# Patient Record
Sex: Male | Born: 1975 | Race: White | Hispanic: No | Marital: Single | Smoking: Current every day smoker
Health system: Southern US, Community
[De-identification: ages and names within clinical notes are randomized; demographics above are authoritative.]

## PROBLEM LIST (undated history)

## (undated) ENCOUNTER — Emergency Department (HOSPITAL_COMMUNITY): Admission: EM | Payer: Medicare Other | Source: Home / Self Care

## (undated) DIAGNOSIS — R569 Unspecified convulsions: Secondary | ICD-10-CM

## (undated) DIAGNOSIS — F32A Depression, unspecified: Secondary | ICD-10-CM

## (undated) DIAGNOSIS — M1990 Unspecified osteoarthritis, unspecified site: Secondary | ICD-10-CM

## (undated) DIAGNOSIS — H900 Conductive hearing loss, bilateral: Secondary | ICD-10-CM

## (undated) DIAGNOSIS — T7840XA Allergy, unspecified, initial encounter: Secondary | ICD-10-CM

## (undated) DIAGNOSIS — I10 Essential (primary) hypertension: Secondary | ICD-10-CM

## (undated) DIAGNOSIS — F1910 Other psychoactive substance abuse, uncomplicated: Secondary | ICD-10-CM

## (undated) DIAGNOSIS — A809 Acute poliomyelitis, unspecified: Secondary | ICD-10-CM

## (undated) DIAGNOSIS — K219 Gastro-esophageal reflux disease without esophagitis: Secondary | ICD-10-CM

## (undated) DIAGNOSIS — R748 Abnormal levels of other serum enzymes: Secondary | ICD-10-CM

## (undated) DIAGNOSIS — F329 Major depressive disorder, single episode, unspecified: Secondary | ICD-10-CM

## (undated) HISTORY — DX: Gastro-esophageal reflux disease without esophagitis: K21.9

## (undated) HISTORY — PX: WRIST SURGERY: SHX841

## (undated) HISTORY — DX: Major depressive disorder, single episode, unspecified: F32.9

## (undated) HISTORY — DX: Unspecified osteoarthritis, unspecified site: M19.90

## (undated) HISTORY — DX: Abnormal levels of other serum enzymes: R74.8

## (undated) HISTORY — DX: Conductive hearing loss, bilateral: H90.0

## (undated) HISTORY — DX: Other psychoactive substance abuse, uncomplicated: F19.10

## (undated) HISTORY — DX: Allergy, unspecified, initial encounter: T78.40XA

## (undated) HISTORY — DX: Depression, unspecified: F32.A

---
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
## 1998-11-04 ENCOUNTER — Emergency Department (HOSPITAL_COMMUNITY): Admission: EM | Admit: 1998-11-04 | Discharge: 1998-11-04 | Payer: Self-pay | Admitting: Emergency Medicine

## 1998-11-05 ENCOUNTER — Emergency Department (HOSPITAL_COMMUNITY): Admission: EM | Admit: 1998-11-05 | Discharge: 1998-11-05 | Payer: Self-pay | Admitting: Emergency Medicine

## 1999-11-04 ENCOUNTER — Emergency Department (HOSPITAL_COMMUNITY): Admission: EM | Admit: 1999-11-04 | Discharge: 1999-11-04 | Payer: Self-pay | Admitting: Emergency Medicine

## 2000-05-11 ENCOUNTER — Encounter: Payer: Self-pay | Admitting: Family Medicine

## 2000-05-11 ENCOUNTER — Emergency Department (HOSPITAL_COMMUNITY): Admission: EM | Admit: 2000-05-11 | Discharge: 2000-05-11 | Payer: Self-pay | Admitting: Emergency Medicine

## 2001-04-05 ENCOUNTER — Emergency Department (HOSPITAL_COMMUNITY): Admission: EM | Admit: 2001-04-05 | Discharge: 2001-04-05 | Payer: Self-pay | Admitting: Emergency Medicine

## 2001-07-28 ENCOUNTER — Encounter: Payer: Self-pay | Admitting: *Deleted

## 2001-07-28 ENCOUNTER — Emergency Department (HOSPITAL_COMMUNITY): Admission: EM | Admit: 2001-07-28 | Discharge: 2001-07-28 | Payer: Self-pay | Admitting: *Deleted

## 2002-04-23 ENCOUNTER — Emergency Department (HOSPITAL_COMMUNITY): Admission: EM | Admit: 2002-04-23 | Discharge: 2002-04-23 | Payer: Self-pay | Admitting: Emergency Medicine

## 2003-03-29 ENCOUNTER — Emergency Department (HOSPITAL_COMMUNITY): Admission: EM | Admit: 2003-03-29 | Discharge: 2003-03-29 | Payer: Self-pay | Admitting: Emergency Medicine

## 2004-03-31 ENCOUNTER — Emergency Department (HOSPITAL_COMMUNITY): Admission: EM | Admit: 2004-03-31 | Discharge: 2004-03-31 | Payer: Self-pay | Admitting: Emergency Medicine

## 2004-09-24 ENCOUNTER — Emergency Department (HOSPITAL_COMMUNITY): Admission: EM | Admit: 2004-09-24 | Discharge: 2004-09-24 | Payer: Self-pay | Admitting: Emergency Medicine

## 2004-10-20 ENCOUNTER — Emergency Department (HOSPITAL_COMMUNITY): Admission: EM | Admit: 2004-10-20 | Discharge: 2004-10-20 | Payer: Self-pay | Admitting: *Deleted

## 2004-11-25 ENCOUNTER — Emergency Department (HOSPITAL_COMMUNITY): Admission: EM | Admit: 2004-11-25 | Discharge: 2004-11-25 | Payer: Self-pay | Admitting: Emergency Medicine

## 2005-09-21 ENCOUNTER — Emergency Department (HOSPITAL_COMMUNITY): Admission: EM | Admit: 2005-09-21 | Discharge: 2005-09-21 | Payer: Self-pay | Admitting: Emergency Medicine

## 2006-01-02 ENCOUNTER — Emergency Department (HOSPITAL_COMMUNITY): Admission: EM | Admit: 2006-01-02 | Discharge: 2006-01-03 | Payer: Self-pay | Admitting: Emergency Medicine

## 2006-01-16 ENCOUNTER — Emergency Department (HOSPITAL_COMMUNITY): Admission: EM | Admit: 2006-01-16 | Discharge: 2006-01-16 | Payer: Self-pay | Admitting: Emergency Medicine

## 2006-05-04 ENCOUNTER — Ambulatory Visit: Payer: Self-pay | Admitting: Internal Medicine

## 2006-05-07 ENCOUNTER — Ambulatory Visit (HOSPITAL_COMMUNITY): Admission: RE | Admit: 2006-05-07 | Discharge: 2006-05-07 | Payer: Self-pay | Admitting: Internal Medicine

## 2006-05-11 ENCOUNTER — Ambulatory Visit: Payer: Self-pay | Admitting: Internal Medicine

## 2006-05-24 ENCOUNTER — Ambulatory Visit: Payer: Self-pay | Admitting: *Deleted

## 2006-09-04 ENCOUNTER — Encounter: Admission: RE | Admit: 2006-09-04 | Discharge: 2006-12-03 | Payer: Self-pay | Admitting: Family Medicine

## 2006-11-04 ENCOUNTER — Emergency Department (HOSPITAL_COMMUNITY): Admission: EM | Admit: 2006-11-04 | Discharge: 2006-11-04 | Payer: Self-pay | Admitting: Emergency Medicine

## 2007-01-14 ENCOUNTER — Emergency Department (HOSPITAL_COMMUNITY): Admission: EM | Admit: 2007-01-14 | Discharge: 2007-01-14 | Payer: Self-pay | Admitting: Family Medicine

## 2007-02-20 ENCOUNTER — Encounter: Admission: RE | Admit: 2007-02-20 | Discharge: 2007-05-03 | Payer: Self-pay | Admitting: Anesthesiology

## 2007-03-13 ENCOUNTER — Encounter (INDEPENDENT_AMBULATORY_CARE_PROVIDER_SITE_OTHER): Payer: Self-pay | Admitting: *Deleted

## 2007-10-05 ENCOUNTER — Emergency Department (HOSPITAL_COMMUNITY): Admission: EM | Admit: 2007-10-05 | Discharge: 2007-10-05 | Payer: Self-pay | Admitting: Emergency Medicine

## 2007-11-06 ENCOUNTER — Emergency Department (HOSPITAL_COMMUNITY): Admission: EM | Admit: 2007-11-06 | Discharge: 2007-11-06 | Payer: Self-pay | Admitting: Emergency Medicine

## 2007-12-31 ENCOUNTER — Emergency Department (HOSPITAL_COMMUNITY): Admission: EM | Admit: 2007-12-31 | Discharge: 2007-12-31 | Payer: Self-pay | Admitting: Emergency Medicine

## 2008-03-29 ENCOUNTER — Inpatient Hospital Stay (HOSPITAL_COMMUNITY): Admission: AD | Admit: 2008-03-29 | Discharge: 2008-04-01 | Payer: Self-pay | Admitting: Neurology

## 2008-03-29 ENCOUNTER — Encounter: Payer: Self-pay | Admitting: Emergency Medicine

## 2008-03-29 ENCOUNTER — Ambulatory Visit: Payer: Self-pay | Admitting: Internal Medicine

## 2008-11-02 ENCOUNTER — Emergency Department (HOSPITAL_COMMUNITY): Admission: EM | Admit: 2008-11-02 | Discharge: 2008-11-02 | Payer: Self-pay | Admitting: Emergency Medicine

## 2008-11-07 ENCOUNTER — Emergency Department (HOSPITAL_COMMUNITY): Admission: EM | Admit: 2008-11-07 | Discharge: 2008-11-07 | Payer: Self-pay | Admitting: Emergency Medicine

## 2008-11-16 ENCOUNTER — Emergency Department (HOSPITAL_COMMUNITY): Admission: EM | Admit: 2008-11-16 | Discharge: 2008-11-16 | Payer: Self-pay | Admitting: Emergency Medicine

## 2009-02-10 ENCOUNTER — Emergency Department (HOSPITAL_COMMUNITY): Admission: EM | Admit: 2009-02-10 | Discharge: 2009-02-10 | Payer: Self-pay | Admitting: Emergency Medicine

## 2009-03-31 ENCOUNTER — Encounter (INDEPENDENT_AMBULATORY_CARE_PROVIDER_SITE_OTHER): Payer: Self-pay | Admitting: Orthopedic Surgery

## 2009-03-31 ENCOUNTER — Ambulatory Visit (HOSPITAL_COMMUNITY): Admission: RE | Admit: 2009-03-31 | Discharge: 2009-03-31 | Payer: Self-pay | Admitting: Orthopedic Surgery

## 2009-06-27 ENCOUNTER — Emergency Department (HOSPITAL_COMMUNITY): Admission: EM | Admit: 2009-06-27 | Discharge: 2009-06-27 | Payer: Self-pay | Admitting: Emergency Medicine

## 2010-09-29 LAB — CBC
HCT: 41.4 % (ref 39.0–52.0)
Hemoglobin: 14.3 g/dL (ref 13.0–17.0)
MCHC: 34.5 g/dL (ref 30.0–36.0)
MCV: 94 fL (ref 78.0–100.0)
Platelets: 154 K/uL (ref 150–400)
RBC: 4.41 MIL/uL (ref 4.22–5.81)
RDW: 12.9 % (ref 11.5–15.5)
WBC: 5.6 K/uL (ref 4.0–10.5)

## 2010-10-04 LAB — POCT I-STAT, CHEM 8
Chloride: 103 mEq/L (ref 96–112)
Creatinine, Ser: 1.1 mg/dL (ref 0.4–1.5)
Glucose, Bld: 103 mg/dL — ABNORMAL HIGH (ref 70–99)
Potassium: 3.9 mEq/L (ref 3.5–5.1)
Sodium: 134 mEq/L — ABNORMAL LOW (ref 135–145)

## 2010-10-13 ENCOUNTER — Emergency Department (HOSPITAL_COMMUNITY)
Admission: EM | Admit: 2010-10-13 | Discharge: 2010-10-13 | Disposition: A | Discharge status: Home / Self Care | Payer: Medicare Other | Attending: Emergency Medicine | Admitting: Emergency Medicine

## 2010-10-13 DIAGNOSIS — I10 Essential (primary) hypertension: Secondary | ICD-10-CM | POA: Insufficient documentation

## 2010-10-13 DIAGNOSIS — W268XXA Contact with other sharp object(s), not elsewhere classified, initial encounter: Secondary | ICD-10-CM | POA: Insufficient documentation

## 2010-10-13 DIAGNOSIS — R569 Unspecified convulsions: Secondary | ICD-10-CM | POA: Insufficient documentation

## 2010-10-13 DIAGNOSIS — M9380 Other specified osteochondropathies of unspecified site: Secondary | ICD-10-CM | POA: Insufficient documentation

## 2010-10-13 DIAGNOSIS — S61209A Unspecified open wound of unspecified finger without damage to nail, initial encounter: Secondary | ICD-10-CM | POA: Insufficient documentation

## 2010-10-13 DIAGNOSIS — Z8679 Personal history of other diseases of the circulatory system: Secondary | ICD-10-CM | POA: Insufficient documentation

## 2010-10-13 DIAGNOSIS — F17200 Nicotine dependence, unspecified, uncomplicated: Secondary | ICD-10-CM | POA: Insufficient documentation

## 2010-11-08 NOTE — Procedures (Signed)
EEG NUMBER:  [id number].   HISTORY:  This is a 36 year old patient with new onset of seizure-type  activities and history of alcohol abuse.  This is a portable EEG  recording.  No skull defects noted.   MEDICATIONS:  Ativan, Dilantin, Diprivan, and Unasyn.   EEG CLASSIFICATION:  Essentially normal awake.   DESCRIPTION:  The background rhythm of this recording consists of a  fairly well-modulated medium amplitude alpha rhythm of 11 Hz that is  reactive to eye opening and closure.  As the record progresses, the  patient appears to remain in awake state throughout the recording.  Photic stimulation was performed resulting in a very minimal background  photic drive response.  Hyperventilation was not performed.  At no time  during the recording, did there appear to be evidence of spikes, spike  wave discharges, or evidence of focal slowing.  At times, overlying beta  frequency activity seen.  EKG monitor shows no evidence of cardiac  rhythm abnormalities with a heart rate of 78.   IMPRESSION:  This is an essentially normal EEG recording in the waking  state.  Overlying beta frequency activity may be related to medication  such as Ativan.  No epileptiform discharges were seen at any time during  this recording.      Marlan Palau, M.D.  Electronically Signed     ZOX:WRUE  D:  03/30/2008 16:14:02  T:  03/31/2008 04:22:45  Job #:  [id number]

## 2010-11-08 NOTE — H&P (Signed)
[name], Parker NO.:  [id number]   MEDICAL RECORD NO.:  [id number]          PATIENT TYPE:  INP   LOCATION:  3106                         FACILITY:  [facility]   PHYSICIAN:  Noel Christmas, MD    DATE OF BIRTH:  January 13, 1976   DATE OF ADMISSION:  03/29/2008  DATE OF DISCHARGE:                              HISTORY & PHYSICAL   CHIEF COMPLAINT:  Recurrent generalized seizures.   A 35 year old man who presented to the Emergency Room at [facility] following a witnessed generalized seizure.  The patient  apparently fell face down and had witnessed tonic-clonic activity  followed by postictal confusion.  He had a second generalized seizure in  the emergency room that was witnessed by the ER physician.  He was  treated with Ativan and subsequently given 1 g of fosphenytoin IV.  The  patient became awake and responsive.  CT of his head showed no acute  intracranial abnormalities.  He was subsequently transferred to [facility] for further management.  There is no previous history of  seizures and except for an equivocal history of childhood seizures.  He  admits to heavy alcohol drinking, but stopped 3-4 weeks ago.  The  patient experienced a third generalized seizure after arriving in the  Intensive Care Unit at [facility].   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  He has no  known systemic disease and takes no medications on a routine basis.   FAMILY HISTORY:  Unavailable.   SOCIAL HISTORY:  Also unavailable other than he apparently lives with  his girlfriend and recently began to do so.  His employment status is  unknown.   PHYSICAL EXAMINATION:  VITAL SIGNS:  Temperature 98.4, pulse 112 per  minute, respirations 20 per minute, and blood pressure was 129/82.  GENERAL:  Appearance was that of a young man of slender build who was  postictal and  responsive only to noxious stimuli.  His general exam showed abrasions involving his upper lip and chin.  HEAD, EYES, EARS, NOSE, AND THROAT:  Otherwise unremarkable.  NECK:  Supple with no masses or tenderness.  CHEST:  Clear to auscultation.  CARDIAC:  Unremarkable except for tachycardia.  S1 and S2 were normal.  There were no abnormal heart sounds.  ABDOMEN:  Soft and flat.  Bowel sounds were normal.  There were no  masses.  EXTREMITIES:  Asymmetrical with atrophy of his right leg and foot as  well as apparent footdrop on the right.  Left lower extremity appeared  to be normal in size.  Muscle tone was spastic throughout.  GU:  The patient has normal male genitalia.  RECTAL:  Deferred.  His pupils are equal and reactive normally to light.  Extraocular  movements were full and conjugated with oculocephalic maneuvers.  There  was no facial weakness.  He moved upper extremities equally and appeared  to have symmetrical strength in his upper extremities.  Deep tendon  refluxes were 2+ and  symmetrical except for absent reflexes involving  right lower extremity.  Plantar responses were mute.  Carotid  auscultation was normal.   CLINICAL IMPRESSION:  Status epilepticus, grand mal.   PLAN:  1. Intubation for airway protection as well as for administration of      propofol for seizure management.  2. We will continue phenytoin for seizure management as well.  3. EEG in the a.m.  4. MRI of the brain without and with contrast in the a.m.  5. Alcohol withdrawal precautions despite his history of apparently      discontinuing alcohol 3-4 weeks ago.  6. Thiamine 100 mg IM daily.      Noel Christmas, MD  Electronically Signed     CS/MEDQ  D:  03/29/2008  T:  03/30/2008  Job:  [id number]

## 2010-11-11 NOTE — Discharge Summary (Signed)
Keith Parker, KOWAL NO.:  [id number]   MEDICAL RECORD NO.:  [id number]           PATIENT TYPE:   LOCATION:                                 FACILITY:   PHYSICIAN:  Levert Feinstein, MD          DATE OF BIRTH:  11-02-1975   DATE OF ADMISSION:  DATE OF DISCHARGE:                               DISCHARGE SUMMARY   DISCHARGE DIAGNOSES:  1. Status epilepticus.  2. Alcohol abuse.  3. Polio with right leg weakness.   DISCHARGE MEDICATIONS:  Keppra 500 mg b.i.d.   HOSPITAL COURSE:  The patient is a 35 year old male, was admitted on  March 29, 2008, following generalized seizure, initial seizure was in  March 29, 2008, described as general tonic-clonic seizure, followed by  post event confusion, had 3 seizures in 24hours, was giving Ativan  subsequently loaded with fosphenytoin, and later required intubation for  airway protection. He was found to have aspiration in trach tube.   He was extubated after 24 hours.  He initially was giving Dilantin,  later switched over to Keppra 500 b.i.d. tolerated very well. No  recurrent episode.   PROCEDURE:  MRI of the brain with and without contrast was normal.  There was no acute lesion.  EEG was normal.  Lab evaluation, CMP with  mildly elevated glucose of 103 and mildly low albumin of 2.8, was  otherwise normal, CBC was normal, and Dilantin level was 15.8 upon  April 01, 2008, but later was switched to Keppra.   During the hospital admission, because of worry about aspiration  pneumonia, he was giving Zosyn, later discharged with Augmentin.  The  etiology for his status epilepticus likely alcohol withdrawal.  Neurological examination upon discharge, he was alert, oriented to  history taking and casual conversation.  He was afebrile.  Cranial  nerves II-XII were normal.  He does have profound atrophy of right lower  extremity and profound right leg weakness 0-1/5 due to previous polio,  but bilateral upper extremity and left  lower extremity motor strength is  normal.  He is to follow up with [facility] in 1 to 2 months  upon discharge.      Levert Feinstein, MD  Electronically Signed     YY/MEDQ  D:  04/02/2008  T:  04/03/2008  Job:  [id number]

## 2011-03-18 ENCOUNTER — Emergency Department (HOSPITAL_COMMUNITY): Payer: Medicare Other

## 2011-03-18 ENCOUNTER — Emergency Department (HOSPITAL_COMMUNITY)
Admission: EM | Admit: 2011-03-18 | Discharge: 2011-03-18 | Disposition: A | Discharge status: Home / Self Care | Payer: Medicare Other | Attending: Emergency Medicine | Admitting: Emergency Medicine

## 2011-03-18 DIAGNOSIS — M25539 Pain in unspecified wrist: Secondary | ICD-10-CM | POA: Insufficient documentation

## 2011-03-18 DIAGNOSIS — S52123A Displaced fracture of head of unspecified radius, initial encounter for closed fracture: Secondary | ICD-10-CM | POA: Insufficient documentation

## 2011-03-18 DIAGNOSIS — W11XXXA Fall on and from ladder, initial encounter: Secondary | ICD-10-CM | POA: Insufficient documentation

## 2011-03-18 DIAGNOSIS — Y92009 Unspecified place in unspecified non-institutional (private) residence as the place of occurrence of the external cause: Secondary | ICD-10-CM | POA: Insufficient documentation

## 2011-03-18 DIAGNOSIS — F17200 Nicotine dependence, unspecified, uncomplicated: Secondary | ICD-10-CM | POA: Insufficient documentation

## 2011-03-18 DIAGNOSIS — M12539 Traumatic arthropathy, unspecified wrist: Secondary | ICD-10-CM | POA: Insufficient documentation

## 2011-03-18 DIAGNOSIS — M25579 Pain in unspecified ankle and joints of unspecified foot: Secondary | ICD-10-CM | POA: Insufficient documentation

## 2011-03-27 LAB — COMPREHENSIVE METABOLIC PANEL
ALT: 24
AST: 33
AST: 35
Albumin: 3.3 — ABNORMAL LOW
BUN: 6
BUN: 7
CO2: 24
CO2: 28
Calcium: 8.2 — ABNORMAL LOW
Calcium: 8.6
Calcium: 8.6
Creatinine, Ser: 0.87
Creatinine, Ser: 1.09
GFR calc Af Amer: >60
GFR calc Af Amer: >60
GFR calc non Af Amer: >60
GFR calc non Af Amer: >60
Glucose, Bld: 71
Potassium: 3.3 — ABNORMAL LOW
Sodium: 143
Total Protein: 5.5 — ABNORMAL LOW
Total Protein: 5.8 — ABNORMAL LOW
Total Protein: 5.9 — ABNORMAL LOW

## 2011-03-27 LAB — BLOOD GAS, ARTERIAL
Acid-Base Excess: 0.3
Acid-Base Excess: 1.8
Acid-Base Excess: 1.9
Acid-Base Excess: 2.1 — ABNORMAL HIGH
Bicarbonate: 26 — ABNORMAL HIGH
Drawn by: 277331
FIO2: 0.4
FIO2: 0.4
FIO2: 0.6
MECHVT: 450
MECHVT: 550
O2 Saturation: 92.9
O2 Saturation: 97.8
O2 Saturation: 98.8
PEEP: 5
Patient temperature: 98.6
Patient temperature: 98.6
RATE: 10
RATE: 12
TCO2: 25.8
TCO2: 27.2
pCO2 arterial: 28.8 — ABNORMAL LOW
pH, Arterial: 7.395

## 2011-03-27 LAB — CBC
HCT: 40.1
Hemoglobin: 13.7
MCHC: 33.9
MCHC: 34.2
MCHC: 34.4
MCV: 96.4
MCV: 97
Platelets: 156
RBC: 4.14 — ABNORMAL LOW
RDW: 11.9
RDW: 12.2

## 2011-03-27 LAB — CK TOTAL AND CKMB (NOT AT ARMC)
CK, MB: 4.9 — ABNORMAL HIGH
Relative Index: 2.2

## 2011-03-27 LAB — URINALYSIS, ROUTINE W REFLEX MICROSCOPIC
Bilirubin Urine: NEGATIVE
Glucose, UA: >1000 — AB
Ketones, ur: 15 — AB
Leukocytes, UA: NEGATIVE
Nitrite: NEGATIVE
Protein, ur: NEGATIVE
pH: 7

## 2011-03-27 LAB — CULTURE, BAL-QUANTITATIVE W GRAM STAIN
Culture: NO GROWTH
Gram Stain: NONE SEEN

## 2011-03-27 LAB — PHENYTOIN LEVEL, TOTAL: Phenytoin Lvl: 17.7

## 2011-03-27 LAB — B-NATRIURETIC PEPTIDE (CONVERTED LAB): Pro B Natriuretic peptide (BNP): 258 — ABNORMAL HIGH

## 2011-03-27 LAB — MAGNESIUM: Magnesium: 2.1

## 2011-03-28 ENCOUNTER — Ambulatory Visit
Admission: RE | Admit: 2011-03-28 | Discharge: 2011-03-28 | Disposition: A | Discharge status: Home / Self Care | Payer: Medicare Other | Source: Ambulatory Visit | Attending: Sports Medicine | Admitting: Sports Medicine

## 2011-03-28 ENCOUNTER — Other Ambulatory Visit: Payer: Self-pay | Admitting: Sports Medicine

## 2011-03-28 DIAGNOSIS — R52 Pain, unspecified: Secondary | ICD-10-CM

## 2011-03-28 LAB — COMPREHENSIVE METABOLIC PANEL
ALT: 29
AST: 36
Albumin: 4.1
Alkaline Phosphatase: 65
BUN: 5 — ABNORMAL LOW
CO2: 21
Calcium: 9.8
Chloride: 101
Creatinine, Ser: 0.95
GFR calc Af Amer: >60
GFR calc non Af Amer: >60
Glucose, Bld: 89
Potassium: 3.6
Sodium: 139
Total Bilirubin: 0.5
Total Protein: 7.3

## 2011-03-28 LAB — URINALYSIS, ROUTINE W REFLEX MICROSCOPIC
Bilirubin Urine: NEGATIVE
Glucose, UA: NEGATIVE
Ketones, ur: NEGATIVE
Leukocytes, UA: NEGATIVE
Nitrite: NEGATIVE
Protein, ur: NEGATIVE
Specific Gravity, Urine: 1.02
Urobilinogen, UA: 0.2
pH: 7

## 2011-03-28 LAB — SALICYLATE LEVEL: Salicylate Lvl: <4

## 2011-03-28 LAB — CBC
HCT: 49.5
Hemoglobin: 17
MCHC: 34.4
MCV: 97.8
Platelets: 202
RBC: 5.06
RDW: 12
WBC: 15.7 — ABNORMAL HIGH

## 2011-03-28 LAB — URINE MICROSCOPIC-ADD ON

## 2011-03-28 LAB — DIFFERENTIAL
Basophils Absolute: 0.1
Basophils Relative: 1
Eosinophils Relative: 1
Lymphocytes Relative: 18

## 2011-03-28 LAB — RAPID URINE DRUG SCREEN, HOSP PERFORMED
Amphetamines: NOT DETECTED
Tetrahydrocannabinol: NOT DETECTED

## 2011-03-28 LAB — PROTIME-INR: INR: 1

## 2011-03-28 LAB — APTT: aPTT: 28

## 2011-03-28 LAB — LIPASE, BLOOD: Lipase: 17

## 2011-03-28 LAB — MAGNESIUM: Magnesium: 2.4

## 2011-03-28 LAB — GLUCOSE, CAPILLARY: Glucose-Capillary: 95

## 2011-07-18 ENCOUNTER — Emergency Department (HOSPITAL_COMMUNITY)
Admission: EM | Admit: 2011-07-18 | Discharge: 2011-07-18 | Disposition: A | Discharge status: Home / Self Care | Payer: Medicare Other | Attending: Emergency Medicine | Admitting: Emergency Medicine

## 2011-07-18 ENCOUNTER — Encounter (HOSPITAL_COMMUNITY): Payer: Self-pay | Admitting: Emergency Medicine

## 2011-07-18 DIAGNOSIS — K0889 Other specified disorders of teeth and supporting structures: Secondary | ICD-10-CM

## 2011-07-18 DIAGNOSIS — R220 Localized swelling, mass and lump, head: Secondary | ICD-10-CM | POA: Insufficient documentation

## 2011-07-18 DIAGNOSIS — K1370 Unspecified lesions of oral mucosa: Secondary | ICD-10-CM | POA: Insufficient documentation

## 2011-07-18 DIAGNOSIS — K089 Disorder of teeth and supporting structures, unspecified: Secondary | ICD-10-CM | POA: Insufficient documentation

## 2011-07-18 MED ORDER — HYDROCODONE-ACETAMINOPHEN 5-325 MG PO TABS: 1.0000 | ORAL_TABLET | Freq: Four times a day (QID) | ORAL | Status: AC | PRN

## 2011-07-18 MED ORDER — PENICILLIN V POTASSIUM 500 MG PO TABS: 500.0000 mg | ORAL_TABLET | Freq: Four times a day (QID) | ORAL | Status: AC

## 2011-07-18 MED ORDER — OXYCODONE-ACETAMINOPHEN 5-325 MG PO TABS
1.0000 | ORAL_TABLET | Freq: Once | ORAL | Status: AC
  Administered 2011-07-18: 1 via ORAL
  Filled 2011-07-18: qty 1

## 2011-07-18 NOTE — ED Provider Notes (Signed)
History     CSN: [id number]  Arrival date & time 07/18/11  1628   First MD Initiated Contact with Patient 07/18/11 1925      Chief Complaint  Patient presents with  . Dental Pain    (Consider location/radiation/quality/duration/timing/severity/associated sxs/prior treatment) Patient is a 36 y.o. male presenting with tooth pain. The history is provided by the patient (Patient complains of a toothache on the left upper side.). No language interpreter was used.  Dental PainThe primary symptoms include mouth pain. Primary symptoms do not include dental injury, headaches or cough. The symptoms began 2 days ago. The symptoms are worsening. The symptoms are new. The symptoms occur constantly.  Mouth pain began 3 - 5 days ago. Mouth pain occurs constantly. Affected locations include: teeth.  Additional symptoms include: dental sensitivity to temperature and gum swelling. Additional symptoms do not include: fatigue. Medical issues include: smoking. Medical issues do not include: alcohol problem.    History reviewed. No pertinent past medical history.  History reviewed. No pertinent past surgical history.  History reviewed. No pertinent family history.  History  Substance Use Topics  . Smoking status: Not on file  . Smokeless tobacco: Not on file  . Alcohol Use: No      Review of Systems  Constitutional: Negative for fatigue.  HENT: Negative for congestion, sinus pressure and ear discharge.        Tooth pain  Eyes: Negative for discharge.  Respiratory: Negative for cough.   Cardiovascular: Negative for chest pain.  Gastrointestinal: Negative for abdominal pain and diarrhea.  Genitourinary: Negative for frequency and hematuria.  Musculoskeletal: Negative for back pain.  Skin: Negative for rash.  Neurological: Negative for seizures and headaches.  Hematological: Negative.   Psychiatric/Behavioral: Negative for hallucinations.    Allergies  Review of patient's allergies  indicates no known allergies.  Home Medications   Current Outpatient Rx  Name Route Sig Dispense Refill  . HYDROCODONE-ACETAMINOPHEN 5-325 MG PO TABS Oral Take 1 tablet by mouth every 6 (six) hours as needed for pain. 30 tablet 0  . PENICILLIN V POTASSIUM 500 MG PO TABS Oral Take 1 tablet (500 mg total) by mouth 4 (four) times daily. 40 tablet 0    BP 151/105  Pulse 66  Temp(Src) 97.3 F (36.3 C) (Oral)  Resp 18  SpO2 97%  Physical Exam  Constitutional: He is oriented to person, place, and time. He appears well-developed.  HENT:  Head: Normocephalic and atraumatic.       Tender left upper molar  Eyes: Conjunctivae and EOM are normal. No scleral icterus.  Neck: Neck supple. No thyromegaly present.  Cardiovascular: Normal rate and regular rhythm.  Exam reveals no gallop and no friction rub.   No murmur heard. Pulmonary/Chest: No stridor. He has no wheezes. He has no rales. He exhibits no tenderness.  Abdominal: He exhibits no distension. There is no tenderness. There is no rebound.  Musculoskeletal: Normal range of motion. He exhibits no edema.  Lymphadenopathy:    He has no cervical adenopathy.  Neurological: He is oriented to person, place, and time. Coordination normal.  Skin: No rash noted. No erythema.  Psychiatric: He has a normal mood and affect. His behavior is normal.    ED Course  Procedures (including critical care time)  Labs Reviewed - No data to display No results found.   1. Toothache       MDM          Benny Lennert, MD 07/18/11 [phone]

## 2011-07-18 NOTE — ED Notes (Signed)
Pt c/o left lower tooth pain after breaking tooth 1 day ago; pt sts unable to see dentist until first of month

## 2011-08-18 ENCOUNTER — Encounter (HOSPITAL_COMMUNITY): Payer: Self-pay

## 2011-08-18 ENCOUNTER — Emergency Department (HOSPITAL_COMMUNITY): Payer: Medicare Other

## 2011-08-18 ENCOUNTER — Emergency Department (HOSPITAL_COMMUNITY)
Admission: EM | Admit: 2011-08-18 | Discharge: 2011-08-18 | Disposition: A | Discharge status: Home Health | Payer: Medicare Other | Attending: Emergency Medicine | Admitting: Emergency Medicine

## 2011-08-18 DIAGNOSIS — S59909A Unspecified injury of unspecified elbow, initial encounter: Secondary | ICD-10-CM | POA: Insufficient documentation

## 2011-08-18 DIAGNOSIS — S6990XA Unspecified injury of unspecified wrist, hand and finger(s), initial encounter: Secondary | ICD-10-CM | POA: Insufficient documentation

## 2011-08-18 DIAGNOSIS — M25539 Pain in unspecified wrist: Secondary | ICD-10-CM | POA: Insufficient documentation

## 2011-08-18 DIAGNOSIS — Z79899 Other long term (current) drug therapy: Secondary | ICD-10-CM | POA: Insufficient documentation

## 2011-08-18 DIAGNOSIS — S63509A Unspecified sprain of unspecified wrist, initial encounter: Secondary | ICD-10-CM | POA: Insufficient documentation

## 2011-08-18 DIAGNOSIS — W010XXA Fall on same level from slipping, tripping and stumbling without subsequent striking against object, initial encounter: Secondary | ICD-10-CM | POA: Insufficient documentation

## 2011-08-18 DIAGNOSIS — M25439 Effusion, unspecified wrist: Secondary | ICD-10-CM | POA: Insufficient documentation

## 2011-08-18 HISTORY — DX: Acute poliomyelitis, unspecified: A80.9

## 2011-08-18 MED ORDER — OXYCODONE-ACETAMINOPHEN 5-325 MG PO TABS
1.0000 | ORAL_TABLET | Freq: Once | ORAL | Status: AC
  Administered 2011-08-18: 1 via ORAL
  Filled 2011-08-18: qty 1

## 2011-08-18 MED ORDER — OXYCODONE-ACETAMINOPHEN 7.5-325 MG PO TABS: 1.0000 | ORAL_TABLET | ORAL | Status: AC | PRN

## 2011-08-18 NOTE — Discharge Instructions (Signed)
Wrist Pain Wrist injuries are frequent in adults and children. A sprain is an injury to the ligaments that hold your bones together. A strain is an injury to muscle or muscle cord-like structures (tendons) from stretching or pulling. Generally, when wrists are moderately tender to touch following a fall or injury, a break in the bone (fracture) may be present. Most wrist sprains or strains are better in 3 to 5 days, but complete healing may take several weeks. HOME CARE INSTRUCTIONS   Put ice on the injured area.   Put ice in a plastic bag.   Place a towel between your skin and the bag.   Leave the ice on for 15 to 20 minutes, 3 to 4 times a day, for the first 2 days.   Keep your arm raised above the level of your heart whenever possible to reduce swelling and pain.   Rest the injured area for at least 48 hours or as directed by your caregiver.   If a splint or elastic bandage has been applied, use it for as long as directed by your caregiver or until seen by a caregiver for a follow-up exam.   Only take over-the-counter or prescription medicines for pain, discomfort, or fever as directed by your caregiver.   Keep all follow-up appointments. You may need to follow up with a specialist or have follow-up X-rays. Improvement in pain level is not a guarantee that you did not fracture a bone in your wrist. The only way to determine whether or not you have a broken bone is by X-ray.  SEEK IMMEDIATE MEDICAL CARE IF:   Your fingers are swollen, very red, white, or cold and blue.   Your fingers are numb or tingling.   You have increasing pain.   You have difficulty moving your fingers.  MAKE SURE YOU:   Understand these instructions.   Will watch your condition.   Will get help right away if you are not doing well or get worse.  Document Released: 03/22/2005 Document Revised: 02/22/2011 Document Reviewed: 08/03/2010 [employer] Patient Information 2012 [employer]. 

## 2011-08-18 NOTE — ED Notes (Signed)
Pt. Larey Seat this am / slipped and caught  Himself with rt. Wrist,  Having pain and swelling.   Pt. Has a hx of rt. Wrist surgery

## 2011-08-18 NOTE — Progress Notes (Signed)
Orthopedic Tech Progress Note Patient Details:  Keith Parker 1976-03-23 [id number]  Type of Splint: Other (comment) Splint Location: right hand Splint Interventions: Application    Gaye Pollack 08/18/2011, 1:04 PM

## 2011-08-18 NOTE — ED Provider Notes (Signed)
History     CSN: [id number]  Arrival date & time 08/18/11  1119   First MD Initiated Contact with Patient 08/18/11 1137      Chief Complaint  Patient presents with  . Extremity Pain    (Consider location/radiation/quality/duration/timing/severity/associated sxs/prior treatment) Patient is a 36 y.o. male presenting with extremity pain. The history is provided by the patient.  Extremity Pain This is a new problem. Pertinent negatives include no shortness of breath.   patient states that he slipped on some water from the dog bowl fell on his right wrist. Said previous fractures of the right wrist and has had Kienbock's disease, and has had a proximal row carpectomy. He's had pain in his right wrist since the fall. No other injury.  Past Medical History  Diagnosis Date  . Polio     Past Surgical History  Procedure Date  . Wrist surgery     right    History reviewed. No pertinent family history.  History  Substance Use Topics  . Smoking status: Not on file  . Smokeless tobacco: Not on file  . Alcohol Use: No      Review of Systems  HENT: Negative for neck stiffness.   Eyes: Negative for pain.  Respiratory: Negative for shortness of breath.   Musculoskeletal:       Right wrist injury  Skin: Negative for rash.  Neurological: Negative for numbness.    Allergies  Review of patient's allergies indicates no known allergies.  Home Medications   Current Outpatient Rx  Name Route Sig Dispense Refill  . ARIPIPRAZOLE 2 MG PO TABS Oral Take 2 mg by mouth daily.    . BC HEADACHE POWDER PO Oral Take 1 packet by mouth every 6 (six) hours as needed. pain    . FLUOXETINE HCL 20 MG PO CAPS Oral Take 20 mg by mouth daily.    Marland Kitchen GABAPENTIN 300 MG PO CAPS Oral Take 900 mg by mouth at bedtime.    . TRAZODONE HCL 100 MG PO TABS Oral Take 100 mg by mouth at bedtime.    . OXYCODONE-ACETAMINOPHEN 7.5-325 MG PO TABS Oral Take 1 tablet by mouth every 4 (four) hours as needed for pain.  15 tablet 0    BP 127/80  Pulse 94  Temp(Src) 98 F (36.7 C) (Oral)  Resp 20  Ht 5\' 2"  (1.575 m)  Wt 144 lb (65.318 kg)  BMI 26.34 kg/m2  SpO2 97%  Physical Exam  Constitutional: He is oriented to person, place, and time. He appears well-developed and well-nourished.  Musculoskeletal:       No tenderness over right shoulder upper arm elbow or proximal forearm. There is tenderness and some swelling of the right wrist. Sensation is intact distally. The tenderness is worse on the lateral aspect distally. Her postsurgical scars. Skin is intact  Neurological: He is alert and oriented to person, place, and time.  Skin: Skin is warm and dry.    ED Course  Procedures (including critical care time)  Labs Reviewed - No data to display Dg Wrist Complete Right  08/18/2011  *RADIOLOGY REPORT*  Clinical Data: History of fall today with pain in the distal radius.  RIGHT WRIST - COMPLETE 3+ VIEW  Comparison: CT of the right wrist 03/18/2011.  Radiographs of right wrist 03/18/2011.  Findings: There are healed fractures of the distal radius and ulnar styloid.  Postoperative changes of resection of the proximal carpal row are again noted, with multiple dystrophic ossifications in the surrounding  soft tissues.  No definite acute displaced fractures noted on today's examination.  IMPRESSION: 1.  No acute radiographic abnormality of the right wrist. 2.  Old healed fractures of the distal right radius and ulnar styloid, and postoperative changes related to resection of the proximal carpal row redemonstrated, as above.  Original Report Authenticated By: Florencia Reasons, M.D.     1. Wrist sprain       MDM  Right wrist pain after fall. Previous fractures of the wrist. X-ray does not show a clear acute fracture. He was splinted and followup with Dr. Mina Marble as needed.        Juliet Rude. Rubin Payor, MD 08/18/11 1253

## 2012-03-20 ENCOUNTER — Emergency Department (INDEPENDENT_AMBULATORY_CARE_PROVIDER_SITE_OTHER)
Admission: EM | Admit: 2012-03-20 | Discharge: 2012-03-20 | Disposition: A | Discharge status: Home / Self Care | Payer: Medicare Other | Source: Home / Self Care

## 2012-03-20 ENCOUNTER — Encounter (HOSPITAL_COMMUNITY): Payer: Self-pay

## 2012-03-20 DIAGNOSIS — K089 Disorder of teeth and supporting structures, unspecified: Secondary | ICD-10-CM

## 2012-03-20 DIAGNOSIS — S025XXA Fracture of tooth (traumatic), initial encounter for closed fracture: Secondary | ICD-10-CM

## 2012-03-20 DIAGNOSIS — K0889 Other specified disorders of teeth and supporting structures: Secondary | ICD-10-CM

## 2012-03-20 MED ORDER — HYDROCODONE-IBUPROFEN 7.5-200 MG PO TABS: 1.0000 | ORAL_TABLET | Freq: Four times a day (QID) | ORAL | Status: DC | PRN

## 2012-03-20 MED ORDER — AMOXICILLIN 500 MG PO CAPS: 500.0000 mg | ORAL_CAPSULE | Freq: Three times a day (TID) | ORAL | Status: DC

## 2012-03-20 NOTE — ED Notes (Signed)
Has broken tooth w resultant pain

## 2012-03-20 NOTE — ED Provider Notes (Signed)
Medical screening examination/treatment/procedure(s) were performed by non-physician practitioner and as supervising physician I was immediately available for consultation/collaboration.  Raynald Blend, MD 03/20/12 [phone]

## 2012-03-20 NOTE — ED Provider Notes (Signed)
History     CSN: [id number]  Arrival date & time 03/20/12  1306   None     No chief complaint on file.   (Consider location/radiation/quality/duration/timing/severity/associated sxs/prior treatment) HPI Comments: 36 year old male who states he fractured his left third molar several months ago.  doing well up until this morning when it 0430 hours he developed moderate to severe tooth pain.he states he does not have enough money to get to a dentist to have it repaired.   Past Medical History  Diagnosis Date  . Polio     Past Surgical History  Procedure Date  . Wrist surgery     right    No family history on file.  History  Substance Use Topics  . Smoking status: Not on file  . Smokeless tobacco: Not on file  . Alcohol Use: No      Review of Systems  Constitutional: Negative.   HENT: Negative for rhinorrhea, neck pain and postnasal drip.   Respiratory: Negative.   Gastrointestinal: Negative.   Musculoskeletal: Negative.     Allergies  Review of patient's allergies indicates no known allergies.  Home Medications   Current Outpatient Rx  Name Route Sig Dispense Refill  . AMOXICILLIN 500 MG PO CAPS Oral Take 1 capsule (500 mg total) by mouth 3 (three) times daily. 21 capsule 0  . ARIPIPRAZOLE 2 MG PO TABS Oral Take 2 mg by mouth daily.    . BC HEADACHE POWDER PO Oral Take 1 packet by mouth every 6 (six) hours as needed. pain    . FLUOXETINE HCL 20 MG PO CAPS Oral Take 20 mg by mouth daily.    Marland Kitchen GABAPENTIN 300 MG PO CAPS Oral Take 900 mg by mouth at bedtime.    Marland Kitchen HYDROCODONE-IBUPROFEN 7.5-200 MG PO TABS Oral Take 1 tablet by mouth every 6 (six) hours as needed for pain. 15 tablet 0  . TRAZODONE HCL 100 MG PO TABS Oral Take 100 mg by mouth at bedtime.      BP 133/83  Pulse 77  Temp 98.8 F (37.1 C) (Oral)  Resp 20  SpO2 98%  Physical Exam  Constitutional: He is oriented to person, place, and time. He appears well-developed. No distress.  HENT:  Head:  Normocephalic and atraumatic.       Generally poor dentition with multiple visible caries. A third lower molar is fractured anteriorly with pulp exposure.  Neck: Normal range of motion. Neck supple.  Lymphadenopathy:    He has no cervical adenopathy.  Neurological: He is alert and oriented to person, place, and time.  Skin: Skin is warm and dry.    ED Course  Procedures (including critical care time)  Labs Reviewed - No data to display No results found.   1. Tooth fracture   2. Odontalgia       MDM  Vicoprofen one every 6 hours when necessary pain #15 Amoxicillin 500 mg 3 times a day Recommended a drug store and get a dental repair kit Recommend seeing a dentist as soon as possible        Hayden Rasmussen, NP 03/20/12 1413

## 2012-06-30 ENCOUNTER — Encounter (HOSPITAL_COMMUNITY): Payer: Self-pay | Admitting: Emergency Medicine

## 2012-06-30 ENCOUNTER — Emergency Department (HOSPITAL_COMMUNITY)
Admission: EM | Admit: 2012-06-30 | Discharge: 2012-06-30 | Disposition: A | Discharge status: Home / Self Care | Payer: Medicare Other | Attending: Emergency Medicine | Admitting: Emergency Medicine

## 2012-06-30 DIAGNOSIS — R05 Cough: Secondary | ICD-10-CM | POA: Insufficient documentation

## 2012-06-30 DIAGNOSIS — A8030 Acute paralytic poliomyelitis, unspecified: Secondary | ICD-10-CM | POA: Insufficient documentation

## 2012-06-30 DIAGNOSIS — F17200 Nicotine dependence, unspecified, uncomplicated: Secondary | ICD-10-CM | POA: Insufficient documentation

## 2012-06-30 DIAGNOSIS — J40 Bronchitis, not specified as acute or chronic: Secondary | ICD-10-CM | POA: Insufficient documentation

## 2012-06-30 DIAGNOSIS — R062 Wheezing: Secondary | ICD-10-CM | POA: Insufficient documentation

## 2012-06-30 DIAGNOSIS — R509 Fever, unspecified: Secondary | ICD-10-CM | POA: Insufficient documentation

## 2012-06-30 DIAGNOSIS — Z791 Long term (current) use of non-steroidal anti-inflammatories (NSAID): Secondary | ICD-10-CM | POA: Insufficient documentation

## 2012-06-30 DIAGNOSIS — Z79899 Other long term (current) drug therapy: Secondary | ICD-10-CM | POA: Insufficient documentation

## 2012-06-30 DIAGNOSIS — I10 Essential (primary) hypertension: Secondary | ICD-10-CM | POA: Insufficient documentation

## 2012-06-30 DIAGNOSIS — R059 Cough, unspecified: Secondary | ICD-10-CM | POA: Insufficient documentation

## 2012-06-30 DIAGNOSIS — J029 Acute pharyngitis, unspecified: Secondary | ICD-10-CM | POA: Insufficient documentation

## 2012-06-30 DIAGNOSIS — Z720 Tobacco use: Secondary | ICD-10-CM

## 2012-06-30 DIAGNOSIS — J329 Chronic sinusitis, unspecified: Secondary | ICD-10-CM | POA: Insufficient documentation

## 2012-06-30 DIAGNOSIS — M2550 Pain in unspecified joint: Secondary | ICD-10-CM | POA: Insufficient documentation

## 2012-06-30 DIAGNOSIS — R51 Headache: Secondary | ICD-10-CM | POA: Insufficient documentation

## 2012-06-30 HISTORY — DX: Essential (primary) hypertension: I10

## 2012-06-30 MED ORDER — ALBUTEROL SULFATE HFA 108 (90 BASE) MCG/ACT IN AERS
2.0000 | INHALATION_SPRAY | RESPIRATORY_TRACT | Status: DC | PRN
  Administered 2012-06-30: 2 via RESPIRATORY_TRACT
  Filled 2012-06-30: qty 6.7

## 2012-06-30 MED ORDER — AZITHROMYCIN 250 MG PO TABS
500.0000 mg | ORAL_TABLET | Freq: Once | ORAL | Status: AC
  Administered 2012-06-30: 500 mg via ORAL
  Filled 2012-06-30: qty 2

## 2012-06-30 MED ORDER — AZITHROMYCIN 250 MG PO TABS: 250.0000 mg | ORAL_TABLET | Freq: Every day | ORAL | Status: AC

## 2012-06-30 NOTE — ED Provider Notes (Signed)
History     CSN: [id number]  Arrival date & time 06/30/12  1051   First MD Initiated Contact with Patient 06/30/12 1141      Chief Complaint  Patient presents with  . Nasal Congestion    congestion and sinus drainage x 3 days  . Fever    101.0 per pt 12 hrs ago    (Consider location/radiation/quality/duration/timing/severity/associated sxs/prior treatment) Patient is a 37 y.o. male presenting with fever. The history is provided by the patient.  Fever Primary symptoms of the febrile illness include fever, headaches, cough, wheezing and arthralgias. Primary symptoms do not include shortness of breath, abdominal pain, nausea, vomiting, myalgias or rash. The current episode started 6 to 7 days ago. This is a new problem. Primary symptoms comment: Productive cough, sinus pressure, nasal congestion in a 2-ppd smoker. He reports fever as well.     Past Medical History  Diagnosis Date  . Polio   . Hypertension     Past Surgical History  Procedure Date  . Wrist surgery     right  . Leg surgery     Family History  Problem Relation Age of Onset  . Cancer Mother     History  Substance Use Topics  . Smoking status: Current Every Day Smoker    Types: Cigarettes  . Smokeless tobacco: Not on file  . Alcohol Use: No      Review of Systems  Constitutional: Positive for fever. Negative for chills.  HENT: Positive for congestion, sore throat, rhinorrhea and sinus pressure. Negative for trouble swallowing and neck pain.   Respiratory: Positive for cough and wheezing. Negative for shortness of breath.   Cardiovascular: Negative.  Negative for chest pain.       Chest pain with cough.  Gastrointestinal: Negative.  Negative for nausea, vomiting and abdominal pain.  Musculoskeletal: Positive for arthralgias. Negative for myalgias.  Skin: Negative.  Negative for rash.  Neurological: Positive for headaches.  Psychiatric/Behavioral: Negative for confusion.    Allergies  Review of  patient's allergies indicates no known allergies.  Home Medications   Current Outpatient Rx  Name  Route  Sig  Dispense  Refill  . FLUOXETINE HCL 20 MG PO CAPS   Oral   Take 20 mg by mouth daily.         Marland Kitchen GABAPENTIN 300 MG PO CAPS   Oral   Take 900 mg by mouth at bedtime.         . IBUPROFEN 200 MG PO TABS   Oral   Take 200 mg by mouth every 6 (six) hours as needed. Pain         . PSEUDOEPHEDRINE HCL 30 MG PO TABS   Oral   Take 30 mg by mouth every 4 (four) hours as needed. Congestion         . TRAZODONE HCL 100 MG PO TABS   Oral   Take 100 mg by mouth at bedtime.           BP 138/80  Pulse 97  Temp 98.3 F (36.8 C) (Oral)  Resp 18  Ht 5\' 2"  (1.575 m)  Wt 145 lb (65.772 kg)  BMI 26.52 kg/m2  SpO2 99%  Physical Exam  Constitutional: He is oriented to person, place, and time. He appears well-developed and well-nourished.  HENT:  Head: Normocephalic.  Right Ear: External ear normal.  Left Ear: External ear normal.  Nose: Mucosal edema present. Right sinus exhibits frontal sinus tenderness. Left sinus exhibits frontal  sinus tenderness.  Mouth/Throat: Oropharynx is clear and moist.  Neck: Normal range of motion. Neck supple.  Cardiovascular: Normal rate and normal heart sounds.   No murmur heard. Pulmonary/Chest: Effort normal. He has no wheezes. He has rales.  Abdominal: Soft. Bowel sounds are normal. He exhibits no distension. There is no tenderness.  Musculoskeletal: Normal range of motion.  Lymphadenopathy:    He has no cervical adenopathy.  Neurological: He is alert and oriented to person, place, and time.  Skin: Skin is warm and dry. No pallor.    ED Course  Procedures (including critical care time)  Labs Reviewed - No data to display No results found.   No diagnosis found.  1. Bronchitis 2. Sinusitis 3. Tobacco abuse  MDM  Smoker with URI symptoms x 1 week. Exam findings c/w sinusitis, bronchitis. Stable vital signs - no hypoxia or  tachycardia/tachypnea.         Arnoldo Hooker, PA-C 06/30/12 1211

## 2012-06-30 NOTE — ED Notes (Addendum)
Productive cough x 3 days, yellow discharge. Cough causing chest wall and hip pain. Denies sore throat. Did not tx for elevated temp, tylenol last taken at 10pm last night. No audible wheezing

## 2012-06-30 NOTE — ED Provider Notes (Signed)
Medical screening examination/treatment/procedure(s) were performed by non-physician practitioner and as supervising physician I was immediately available for consultation/collaboration.   Gavin Pound. Kadar Chance, MD 06/30/12 1219

## 2013-01-07 ENCOUNTER — Other Ambulatory Visit: Payer: Self-pay | Admitting: Orthopedic Surgery

## 2013-01-07 ENCOUNTER — Other Ambulatory Visit: Payer: Self-pay | Admitting: *Deleted

## 2013-01-07 DIAGNOSIS — G14 Postpolio syndrome: Secondary | ICD-10-CM

## 2013-01-08 ENCOUNTER — Other Ambulatory Visit: Payer: Medicare Other

## 2013-01-09 ENCOUNTER — Ambulatory Visit
Admission: RE | Admit: 2013-01-09 | Discharge: 2013-01-09 | Disposition: A | Discharge status: Home / Self Care | Payer: Medicare Other | Source: Ambulatory Visit | Attending: Orthopedic Surgery | Admitting: Orthopedic Surgery

## 2013-01-09 DIAGNOSIS — G14 Postpolio syndrome: Secondary | ICD-10-CM

## 2013-01-31 DIAGNOSIS — F1011 Alcohol abuse, in remission: Secondary | ICD-10-CM | POA: Insufficient documentation

## 2013-01-31 DIAGNOSIS — M549 Dorsalgia, unspecified: Secondary | ICD-10-CM | POA: Insufficient documentation

## 2013-01-31 DIAGNOSIS — F32A Depression, unspecified: Secondary | ICD-10-CM | POA: Insufficient documentation

## 2013-01-31 DIAGNOSIS — M25559 Pain in unspecified hip: Secondary | ICD-10-CM | POA: Insufficient documentation

## 2013-01-31 DIAGNOSIS — G4700 Insomnia, unspecified: Secondary | ICD-10-CM | POA: Insufficient documentation

## 2013-05-05 DIAGNOSIS — B91 Sequelae of poliomyelitis: Secondary | ICD-10-CM | POA: Insufficient documentation

## 2013-06-09 DIAGNOSIS — F1210 Cannabis abuse, uncomplicated: Secondary | ICD-10-CM | POA: Insufficient documentation

## 2013-06-09 DIAGNOSIS — F609 Personality disorder, unspecified: Secondary | ICD-10-CM | POA: Insufficient documentation

## 2013-06-27 DIAGNOSIS — F09 Unspecified mental disorder due to known physiological condition: Secondary | ICD-10-CM | POA: Diagnosis not present

## 2013-07-22 DIAGNOSIS — K620 Anal polyp: Secondary | ICD-10-CM | POA: Diagnosis not present

## 2013-07-22 DIAGNOSIS — K621 Rectal polyp: Secondary | ICD-10-CM | POA: Diagnosis not present

## 2013-07-22 DIAGNOSIS — D126 Benign neoplasm of colon, unspecified: Secondary | ICD-10-CM | POA: Diagnosis not present

## 2013-07-22 DIAGNOSIS — D128 Benign neoplasm of rectum: Secondary | ICD-10-CM | POA: Diagnosis not present

## 2013-07-22 DIAGNOSIS — D129 Benign neoplasm of anus and anal canal: Secondary | ICD-10-CM | POA: Diagnosis not present

## 2013-07-22 DIAGNOSIS — Z1211 Encounter for screening for malignant neoplasm of colon: Secondary | ICD-10-CM | POA: Diagnosis not present

## 2013-07-22 DIAGNOSIS — R195 Other fecal abnormalities: Secondary | ICD-10-CM | POA: Diagnosis not present

## 2013-07-22 DIAGNOSIS — R634 Abnormal weight loss: Secondary | ICD-10-CM | POA: Diagnosis not present

## 2013-08-06 DIAGNOSIS — IMO0002 Reserved for concepts with insufficient information to code with codable children: Secondary | ICD-10-CM | POA: Diagnosis not present

## 2013-08-06 DIAGNOSIS — R634 Abnormal weight loss: Secondary | ICD-10-CM | POA: Insufficient documentation

## 2013-08-06 DIAGNOSIS — F17200 Nicotine dependence, unspecified, uncomplicated: Secondary | ICD-10-CM | POA: Diagnosis not present

## 2013-08-06 DIAGNOSIS — G8929 Other chronic pain: Secondary | ICD-10-CM | POA: Diagnosis not present

## 2013-08-06 DIAGNOSIS — R41840 Attention and concentration deficit: Secondary | ICD-10-CM | POA: Insufficient documentation

## 2013-11-03 DIAGNOSIS — G8929 Other chronic pain: Secondary | ICD-10-CM | POA: Diagnosis not present

## 2013-11-03 DIAGNOSIS — Z79899 Other long term (current) drug therapy: Secondary | ICD-10-CM | POA: Insufficient documentation

## 2013-11-03 DIAGNOSIS — F410 Panic disorder [episodic paroxysmal anxiety] without agoraphobia: Secondary | ICD-10-CM | POA: Diagnosis not present

## 2013-11-29 ENCOUNTER — Emergency Department (HOSPITAL_COMMUNITY)
Admission: EM | Admit: 2013-11-29 | Discharge: 2013-11-29 | Disposition: A | Discharge status: Home / Self Care | Payer: Medicare Other | Attending: Emergency Medicine | Admitting: Emergency Medicine

## 2013-11-29 ENCOUNTER — Encounter (HOSPITAL_COMMUNITY): Payer: Self-pay | Admitting: Emergency Medicine

## 2013-11-29 ENCOUNTER — Emergency Department (HOSPITAL_COMMUNITY): Payer: Medicare Other

## 2013-11-29 DIAGNOSIS — A809 Acute poliomyelitis, unspecified: Secondary | ICD-10-CM | POA: Diagnosis not present

## 2013-11-29 DIAGNOSIS — M79609 Pain in unspecified limb: Secondary | ICD-10-CM | POA: Diagnosis not present

## 2013-11-29 DIAGNOSIS — S9030XA Contusion of unspecified foot, initial encounter: Secondary | ICD-10-CM | POA: Diagnosis not present

## 2013-11-29 DIAGNOSIS — F17200 Nicotine dependence, unspecified, uncomplicated: Secondary | ICD-10-CM | POA: Insufficient documentation

## 2013-11-29 DIAGNOSIS — S8990XA Unspecified injury of unspecified lower leg, initial encounter: Secondary | ICD-10-CM | POA: Diagnosis not present

## 2013-11-29 DIAGNOSIS — S9031XA Contusion of right foot, initial encounter: Secondary | ICD-10-CM

## 2013-11-29 DIAGNOSIS — S99919A Unspecified injury of unspecified ankle, initial encounter: Secondary | ICD-10-CM | POA: Diagnosis not present

## 2013-11-29 DIAGNOSIS — Y929 Unspecified place or not applicable: Secondary | ICD-10-CM | POA: Insufficient documentation

## 2013-11-29 DIAGNOSIS — Z79899 Other long term (current) drug therapy: Secondary | ICD-10-CM | POA: Insufficient documentation

## 2013-11-29 DIAGNOSIS — I10 Essential (primary) hypertension: Secondary | ICD-10-CM | POA: Diagnosis not present

## 2013-11-29 DIAGNOSIS — S8000XA Contusion of unspecified knee, initial encounter: Secondary | ICD-10-CM | POA: Diagnosis not present

## 2013-11-29 DIAGNOSIS — Y939 Activity, unspecified: Secondary | ICD-10-CM | POA: Insufficient documentation

## 2013-11-29 DIAGNOSIS — IMO0002 Reserved for concepts with insufficient information to code with codable children: Secondary | ICD-10-CM | POA: Insufficient documentation

## 2013-11-29 MED ORDER — OXYCODONE-ACETAMINOPHEN 5-325 MG PO TABS
1.0000 | ORAL_TABLET | Freq: Once | ORAL | Status: AC
  Administered 2013-11-29: 1 via ORAL
  Filled 2013-11-29: qty 1

## 2013-11-29 MED ORDER — HYDROCODONE-ACETAMINOPHEN 5-325 MG PO TABS: 1.0000 | ORAL_TABLET | ORAL | Status: DC | PRN

## 2013-11-29 MED ORDER — IBUPROFEN 800 MG PO TABS: 800.0000 mg | ORAL_TABLET | Freq: Three times a day (TID) | ORAL | Status: DC | PRN
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
## 2013-11-29 NOTE — ED Provider Notes (Signed)
Medical screening examination/treatment/procedure(s) were performed by non-physician practitioner and as supervising physician I was immediately available for consultation/collaboration.    Mirna Mires, MD 11/29/13 [phone]
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
## 2013-11-29 NOTE — Discharge Instructions (Signed)
Read the information below.  Use the prescribed medication as directed.  Please discuss all new medications with your pharmacist.  Do not take additional tylenol while taking the prescribed pain medication to avoid overdose.  You may return to the Emergency Department at any time for worsening condition or any new symptoms that concern you.  If you develop uncontrolled pain, weakness or numbness of the extremity, severe discoloration of the skin, or you are unable to walk, return to the ER for a recheck.      Foot Contusion A foot contusion is a deep bruise to the foot. Contusions are the result of an injury that caused bleeding under the skin. The contusion may turn blue, purple, or yellow. Minor injuries will give you a painless contusion, but more severe contusions may stay painful and swollen for a few weeks. CAUSES  A foot contusion comes from a direct blow to that area, such as a heavy object falling on the foot. SYMPTOMS   Swelling of the foot.  Discoloration of the foot.  Tenderness or soreness of the foot. DIAGNOSIS  You will have a physical exam and will be asked about your history. You may need an X-ray of your foot to look for a broken bone (fracture).  TREATMENT  An elastic wrap may be recommended to support your foot. Resting, elevating, and applying cold compresses to your foot are often the best treatments for a foot contusion. Over-the-counter medicines may also be recommended for pain control. HOME CARE INSTRUCTIONS   Put ice on the injured area.  Put ice in a plastic bag.  Place a towel between your skin and the bag.  Leave the ice on for 15-20 minutes, 03-04 times a day.  Only take over-the-counter or prescription medicines for pain, discomfort, or fever as directed by your caregiver.  If told, use an elastic wrap as directed. This can help reduce swelling. You may remove the wrap for sleeping, showering, and bathing. If your toes become numb, cold, or blue, take the  wrap off and reapply it more loosely.  Elevate your foot with pillows to reduce swelling.  Try to avoid standing or walking while the foot is painful. Do not resume use until instructed by your caregiver. Then, begin use gradually. If pain develops, decrease use. Gradually increase activities that do not cause discomfort until you have normal use of your foot.  See your caregiver as directed. It is very important to keep all follow-up appointments in order to avoid any lasting problems with your foot, including long-term (chronic) pain. SEEK IMMEDIATE MEDICAL CARE IF:   You have increased redness, swelling, or pain in your foot.  Your swelling or pain is not relieved with medicines.  You have loss of feeling in your foot or are unable to move your toes.  Your foot turns cold or blue.  You have pain when you move your toes.  Your foot becomes warm to the touch.  Your contusion does not improve in 2 days. MAKE SURE YOU:   Understand these instructions.  Will watch your condition.  Will get help right away if you are not doing well or get worse. Document Released: 04/03/2006 Document Revised: 12/12/2011 Document Reviewed: 05/16/2011 [facility] Patient Information 2014 [place], [place].

## 2014-01-27 DIAGNOSIS — F1190 Opioid use, unspecified, uncomplicated: Secondary | ICD-10-CM | POA: Insufficient documentation

## 2014-01-30 DIAGNOSIS — K089 Disorder of teeth and supporting structures, unspecified: Secondary | ICD-10-CM | POA: Diagnosis not present

## 2014-02-27 ENCOUNTER — Emergency Department (HOSPITAL_COMMUNITY): Payer: No Typology Code available for payment source

## 2014-02-27 ENCOUNTER — Emergency Department (HOSPITAL_COMMUNITY)
Admission: EM | Admit: 2014-02-27 | Discharge: 2014-02-27 | Disposition: A | Discharge status: Home / Self Care | Payer: No Typology Code available for payment source | Attending: Emergency Medicine | Admitting: Emergency Medicine

## 2014-02-27 ENCOUNTER — Encounter (HOSPITAL_COMMUNITY): Payer: Self-pay | Admitting: Emergency Medicine

## 2014-02-27 DIAGNOSIS — IMO0002 Reserved for concepts with insufficient information to code with codable children: Secondary | ICD-10-CM | POA: Diagnosis not present

## 2014-02-27 DIAGNOSIS — S298XXA Other specified injuries of thorax, initial encounter: Secondary | ICD-10-CM | POA: Insufficient documentation

## 2014-02-27 DIAGNOSIS — S139XXA Sprain of joints and ligaments of unspecified parts of neck, initial encounter: Secondary | ICD-10-CM | POA: Insufficient documentation

## 2014-02-27 DIAGNOSIS — I10 Essential (primary) hypertension: Secondary | ICD-10-CM | POA: Diagnosis not present

## 2014-02-27 DIAGNOSIS — Z79899 Other long term (current) drug therapy: Secondary | ICD-10-CM | POA: Insufficient documentation

## 2014-02-27 DIAGNOSIS — Y9389 Activity, other specified: Secondary | ICD-10-CM | POA: Insufficient documentation

## 2014-02-27 DIAGNOSIS — Y92410 Unspecified street and highway as the place of occurrence of the external cause: Secondary | ICD-10-CM | POA: Diagnosis not present

## 2014-02-27 DIAGNOSIS — T148XXA Other injury of unspecified body region, initial encounter: Secondary | ICD-10-CM | POA: Diagnosis not present

## 2014-02-27 DIAGNOSIS — S0993XA Unspecified injury of face, initial encounter: Secondary | ICD-10-CM | POA: Insufficient documentation

## 2014-02-27 DIAGNOSIS — S199XXA Unspecified injury of neck, initial encounter: Secondary | ICD-10-CM

## 2014-02-27 DIAGNOSIS — S3981XA Other specified injuries of abdomen, initial encounter: Secondary | ICD-10-CM | POA: Diagnosis not present

## 2014-02-27 DIAGNOSIS — S161XXA Strain of muscle, fascia and tendon at neck level, initial encounter: Secondary | ICD-10-CM

## 2014-02-27 DIAGNOSIS — F17200 Nicotine dependence, unspecified, uncomplicated: Secondary | ICD-10-CM | POA: Diagnosis not present

## 2014-02-27 DIAGNOSIS — M542 Cervicalgia: Secondary | ICD-10-CM | POA: Diagnosis not present

## 2014-02-27 DIAGNOSIS — S0990XA Unspecified injury of head, initial encounter: Secondary | ICD-10-CM | POA: Diagnosis not present

## 2014-02-27 LAB — I-STAT CHEM 8, ED
BUN: 13 mg/dL (ref 6–23)
CREATININE: 0.8 mg/dL (ref 0.50–1.35)
Calcium, Ion: 1.21 mmol/L (ref 1.12–1.23)
Chloride: 104 mEq/L (ref 96–112)
GLUCOSE: 91 mg/dL (ref 70–99)
HCT: 44 % (ref 39.0–52.0)
Hemoglobin: 15 g/dL (ref 13.0–17.0)
Potassium: 4 mEq/L (ref 3.7–5.3)
SODIUM: 138 meq/L (ref 137–147)
TCO2: 27 mmol/L (ref 0–100)

## 2014-02-27 MED ORDER — CYCLOBENZAPRINE HCL 10 MG PO TABS: 10.0000 mg | ORAL_TABLET | Freq: Two times a day (BID) | ORAL | Status: DC | PRN

## 2014-02-27 MED ORDER — OXYCODONE-ACETAMINOPHEN 5-325 MG PO TABS
1.0000 | ORAL_TABLET | Freq: Once | ORAL | Status: AC
  Administered 2014-02-27: 1 via ORAL
  Filled 2014-02-27: qty 1

## 2014-02-27 MED ORDER — HYDROCODONE-ACETAMINOPHEN 5-325 MG PO TABS: 1.0000 | ORAL_TABLET | Freq: Four times a day (QID) | ORAL | Status: DC | PRN

## 2014-02-27 MED ORDER — IOHEXOL 300 MG/ML  SOLN
100.0000 mL | Freq: Once | INTRAMUSCULAR | Status: AC | PRN
  Administered 2014-02-27: 100 mL via INTRAVENOUS

## 2014-02-27 NOTE — ED Notes (Signed)
Patient transported to CT without distress 

## 2014-02-27 NOTE — ED Notes (Signed)
NAD at this time.  

## 2014-02-27 NOTE — ED Notes (Signed)
PA at bedside.
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
## 2014-02-27 NOTE — Discharge Instructions (Signed)
Please call your doctor for a followup appointment within 24-48 hours. When you talk to your doctor please let them know that you were seen in the emergency department and have them acquire all of your records so that they can discuss the findings with you and formulate a treatment plan to fully care for your new and ongoing problems. Please call and set up an appointment with your primary care provider Please call and set up an appointment with orthopedics Please take medications as prescribed - while on pain medications there is to be no drinking alcohol, driving, operating any heavy machinery. If extra please dispose in a proper manner. Please do not take any extra Tylenol with this medication for this can lead to Tylenol overdose and liver issues.  Please avoid any physical or strenuous activity Please apply heat and massage Please continue to monitor symptoms closely and if symptoms are to worsen or change (fever greater than 101, chills, sweating, nausea, vomiting, chest pain, shortness of breathe, difficulty breathing, weakness, numbness, tingling, worsening or changes to pain pattern, swelling, fall, injury, dizziness, sudden loss of vision, blurred vision, inability to swallow, neck pain, neck stiffness, inability to control urine or bowel movements) please report back to the Emergency Department immediately.    Cervical Strain and Sprain (Whiplash) with Rehab Cervical strain and sprain are injuries that commonly occur with "whiplash" injuries. Whiplash occurs when the neck is forcefully whipped backward or forward, such as during a motor vehicle accident or during contact sports. The muscles, ligaments, tendons, discs, and nerves of the neck are susceptible to injury when this occurs. RISK FACTORS Risk of having a whiplash injury increases if:  Osteoarthritis of the spine.  Situations that make head or neck accidents or trauma more likely.  High-risk sports (football, rugby, wrestling,  hockey, auto racing, gymnastics, diving, contact karate, or boxing).  Poor strength and flexibility of the neck.  Previous neck injury.  Poor tackling technique.  Improperly fitted or padded equipment. SYMPTOMS   Pain or stiffness in the front or back of neck or both.  Symptoms may present immediately or up to 24 hours after injury.  Dizziness, headache, nausea, and vomiting.  Muscle spasm with soreness and stiffness in the neck.  Tenderness and swelling at the injury site. PREVENTION  Learn and use proper technique (avoid tackling with the head, spearing, and head-butting; use proper falling techniques to avoid landing on the head).  Warm up and stretch properly before activity.  Maintain physical fitness:  Strength, flexibility, and endurance.  Cardiovascular fitness.  Wear properly fitted and padded protective equipment, such as padded soft collars, for participation in contact sports. PROGNOSIS  Recovery from cervical strain and sprain injuries is dependent on the extent of the injury. These injuries are usually curable in 1 week to 3 months with appropriate treatment.  RELATED COMPLICATIONS   Temporary numbness and weakness may occur if the nerve roots are damaged, and this may persist until the nerve has completely healed.  Chronic pain due to frequent recurrence of symptoms.  Prolonged healing, especially if activity is resumed too soon (before complete recovery). TREATMENT  Treatment initially involves the use of ice and medication to help reduce pain and inflammation. It is also important to perform strengthening and stretching exercises and modify activities that worsen symptoms so the injury does not get worse. These exercises may be performed at home or with a therapist. For patients who experience severe symptoms, a soft, padded collar may be recommended to be  worn around the neck.  Improving your posture may help reduce symptoms. Posture improvement includes  pulling your chin and abdomen in while sitting or standing. If you are sitting, sit in a firm chair with your buttocks against the back of the chair. While sleeping, try replacing your pillow with a small towel rolled to 2 inches in diameter, or use a cervical pillow or soft cervical collar. Poor sleeping positions delay healing.  For patients with nerve root damage, which causes numbness or weakness, the use of a cervical traction apparatus may be recommended. Surgery is rarely necessary for these injuries. However, cervical strain and sprains that are present at birth (congenital) may require surgery. MEDICATION   If pain medication is necessary, nonsteroidal anti-inflammatory medications, such as aspirin and ibuprofen, or other minor pain relievers, such as acetaminophen, are often recommended.  Do not take pain medication for 7 days before surgery.  Prescription pain relievers may be given if deemed necessary by your caregiver. Use only as directed and only as much as you need. HEAT AND COLD:   Cold treatment (icing) relieves pain and reduces inflammation. Cold treatment should be applied for 10 to 15 minutes every 2 to 3 hours for inflammation and pain and immediately after any activity that aggravates your symptoms. Use ice packs or an ice massage.  Heat treatment may be used prior to performing the stretching and strengthening activities prescribed by your caregiver, physical therapist, or athletic trainer. Use a heat pack or a warm soak. SEEK MEDICAL CARE IF:   Symptoms get worse or do not improve in 2 weeks despite treatment.  New, unexplained symptoms develop (drugs used in treatment may produce side effects). EXERCISES RANGE OF MOTION (ROM) AND STRETCHING EXERCISES - Cervical Strain and Sprain These exercises may help you when beginning to rehabilitate your injury. In order to successfully resolve your symptoms, you must improve your posture. These exercises are designed to help  reduce the forward-head and rounded-shoulder posture which contributes to this condition. Your symptoms may resolve with or without further involvement from your physician, physical therapist or athletic trainer. While completing these exercises, remember:   Restoring tissue flexibility helps normal motion to return to the joints. This allows healthier, less painful movement and activity.  An effective stretch should be held for at least 20 seconds, although you may need to begin with shorter hold times for comfort.  A stretch should never be painful. You should only feel a gentle lengthening or release in the stretched tissue. STRETCH- Axial Extensors  Lie on your back on the floor. You may bend your knees for comfort. Place a rolled-up hand towel or dish towel, about 2 inches in diameter, under the part of your head that makes contact with the floor.  Gently tuck your chin, as if trying to make a "double chin," until you feel a gentle stretch at the base of your head.  Hold __________ seconds. Repeat __________ times. Complete this exercise __________ times per day.  STRETCH - Axial Extension   Stand or sit on a firm surface. Assume a good posture: chest up, shoulders drawn back, abdominal muscles slightly tense, knees unlocked (if standing) and feet hip width apart.  Slowly retract your chin so your head slides back and your chin slightly lowers. Continue to look straight ahead.  You should feel a gentle stretch in the back of your head. Be certain not to feel an aggressive stretch since this can cause headaches later.  Hold for __________ seconds.  Repeat __________ times. Complete this exercise __________ times per day. STRETCH - Cervical Side Bend   Stand or sit on a firm surface. Assume a good posture: chest up, shoulders drawn back, abdominal muscles slightly tense, knees unlocked (if standing) and feet hip width apart.  Without letting your nose or shoulders move, slowly tip your  right / left ear to your shoulder until your feel a gentle stretch in the muscles on the opposite side of your neck.  Hold __________ seconds. Repeat __________ times. Complete this exercise __________ times per day. STRETCH - Cervical Rotators   Stand or sit on a firm surface. Assume a good posture: chest up, shoulders drawn back, abdominal muscles slightly tense, knees unlocked (if standing) and feet hip width apart.  Keeping your eyes level with the ground, slowly turn your head until you feel a gentle stretch along the back and opposite side of your neck.  Hold __________ seconds. Repeat __________ times. Complete this exercise __________ times per day. RANGE OF MOTION - Neck Circles   Stand or sit on a firm surface. Assume a good posture: chest up, shoulders drawn back, abdominal muscles slightly tense, knees unlocked (if standing) and feet hip width apart.  Gently roll your head down and around from the back of one shoulder to the back of the other. The motion should never be forced or painful.  Repeat the motion 10-20 times, or until you feel the neck muscles relax and loosen. Repeat __________ times. Complete the exercise __________ times per day. STRENGTHENING EXERCISES - Cervical Strain and Sprain These exercises may help you when beginning to rehabilitate your injury. They may resolve your symptoms with or without further involvement from your physician, physical therapist, or athletic trainer. While completing these exercises, remember:   Muscles can gain both the endurance and the strength needed for everyday activities through controlled exercises.  Complete these exercises as instructed by your physician, physical therapist, or athletic trainer. Progress the resistance and repetitions only as guided.  You may experience muscle soreness or fatigue, but the pain or discomfort you are trying to eliminate should never worsen during these exercises. If this pain does worsen, stop  and make certain you are following the directions exactly. If the pain is still present after adjustments, discontinue the exercise until you can discuss the trouble with your clinician. STRENGTH - Cervical Flexors, Isometric  Face a wall, standing about 6 inches away. Place a small pillow, a ball about 6-8 inches in diameter, or a folded towel between your forehead and the wall.  Slightly tuck your chin and gently push your forehead into the soft object. Push only with mild to moderate intensity, building up tension gradually. Keep your jaw and forehead relaxed.  Hold 10 to 20 seconds. Keep your breathing relaxed.  Release the tension slowly. Relax your neck muscles completely before you start the next repetition. Repeat __________ times. Complete this exercise __________ times per day. STRENGTH- Cervical Lateral Flexors, Isometric   Stand about 6 inches away from a wall. Place a small pillow, a ball about 6-8 inches in diameter, or a folded towel between the side of your head and the wall.  Slightly tuck your chin and gently tilt your head into the soft object. Push only with mild to moderate intensity, building up tension gradually. Keep your jaw and forehead relaxed.  Hold 10 to 20 seconds. Keep your breathing relaxed.  Release the tension slowly. Relax your neck muscles completely before you start the next  repetition. Repeat __________ times. Complete this exercise __________ times per day. STRENGTH - Cervical Extensors, Isometric   Stand about 6 inches away from a wall. Place a small pillow, a ball about 6-8 inches in diameter, or a folded towel between the back of your head and the wall.  Slightly tuck your chin and gently tilt your head back into the soft object. Push only with mild to moderate intensity, building up tension gradually. Keep your jaw and forehead relaxed.  Hold 10 to 20 seconds. Keep your breathing relaxed.  Release the tension slowly. Relax your neck muscles  completely before you start the next repetition. Repeat __________ times. Complete this exercise __________ times per day. POSTURE AND BODY MECHANICS CONSIDERATIONS - Cervical Strain and Sprain Keeping correct posture when sitting, standing or completing your activities will reduce the stress put on different body tissues, allowing injured tissues a chance to heal and limiting painful experiences. The following are general guidelines for improved posture. Your physician or physical therapist will provide you with any instructions specific to your needs. While reading these guidelines, remember:  The exercises prescribed by your provider will help you have the flexibility and strength to maintain correct postures.  The correct posture provides the optimal environment for your joints to work. All of your joints have less wear and tear when properly supported by a spine with good posture. This means you will experience a healthier, less painful body.  Correct posture must be practiced with all of your activities, especially prolonged sitting and standing. Correct posture is as important when doing repetitive low-stress activities (typing) as it is when doing a single heavy-load activity (lifting). PROLONGED STANDING WHILE SLIGHTLY LEANING FORWARD When completing a task that requires you to lean forward while standing in one place for a long time, place either foot up on a stationary 2- to 4-inch high object to help maintain the best posture. When both feet are on the ground, the low back tends to lose its slight inward curve. If this curve flattens (or becomes too large), then the back and your other joints will experience too much stress, fatigue more quickly, and can cause pain.  RESTING POSITIONS Consider which positions are most painful for you when choosing a resting position. If you have pain with flexion-based activities (sitting, bending, stooping, squatting), choose a position that allows you to  rest in a less flexed posture. You would want to avoid curling into a fetal position on your side. If your pain worsens with extension-based activities (prolonged standing, working overhead), avoid resting in an extended position such as sleeping on your stomach. Most people will find more comfort when they rest with their spine in a more neutral position, neither too rounded nor too arched. Lying on a non-sagging bed on your side with a pillow between your knees, or on your back with a pillow under your knees will often provide some relief. Keep in mind, being in any one position for a prolonged period of time, no matter how correct your posture, can still lead to stiffness. WALKING Walk with an upright posture. Your ears, shoulders, and hips should all line up. OFFICE WORK When working at a desk, create an environment that supports good, upright posture. Without extra support, muscles fatigue and lead to excessive strain on joints and other tissues. CHAIR:  A chair should be able to slide under your desk when your back makes contact with the back of the chair. This allows you to work closely.  The chair's height should allow your eyes to be level with the upper part of your monitor and your hands to be slightly lower than your elbows.  Body position:  Your feet should make contact with the floor. If this is not possible, use a foot rest.  Keep your ears over your shoulders. This will reduce stress on your neck and low back. Document Released: 06/12/2005 Document Revised: 10/27/2013 Document Reviewed: 09/24/2008 [facility] Patient Information 2015 [place], [place]. This information is not intended to replace advice given to you by your health care provider. Make sure you discuss any questions you have with your health care provider.   Emergency Department Resource Guide 1) Find a Doctor and Pay Out of Pocket Although you won't have to find out who is covered by your insurance plan, it is a good  idea to ask around and get recommendations. You will then need to call the office and see if the doctor you have chosen will accept you as a new patient and what types of options they offer for patients who are self-pay. Some doctors offer discounts or will set up payment plans for their patients who do not have insurance, but you will need to ask so you aren't surprised when you get to your appointment.  2) Contact Your Local Health Department Not all health departments have doctors that can see patients for sick visits, but many do, so it is worth a call to see if yours does. If you don't know where your local health department is, you can check in your phone book. The CDC also has a tool to help you locate your state's health department, and many state websites also have listings of all of their local health departments.  3) Find a [facility] If your illness is not likely to be very severe or complicated, you may want to try a walk in clinic. These are popping up all over the country in pharmacies, drugstores, and shopping centers. They're usually staffed by nurse practitioners or physician assistants that have been trained to treat common illnesses and complaints. They're usually fairly quick and inexpensive. However, if you have serious medical issues or chronic medical problems, these are probably not your best option.  No Primary Care Doctor: - Call Health Connect at  [phone] - they can help you locate a primary care doctor that  accepts your insurance, provides certain services, etc. - Physician Referral Service- [phone]  Chronic Pain Problems: Organization         Address  Phone   Notes  [facility]  [phone] Patients need to be referred by their primary care doctor.   Medication Assistance: Organization         Address  Phone   Notes  [facility] Medication [facility] [address] [place], [place] [address] [phone]  --Must be a resident of [facility] -- Must have NO insurance coverage whatsoever (no Medicaid/ Medicare, etc.) -- The pt. MUST have a primary care doctor that directs their care regularly and follows them in the community   [facility]  [phone]   [employer]  [phone]    Agencies that provide inexpensive medical care: Organization         Address  Phone   Notes  [place]  [phone]   [facility]    [phone]   [facility] [place], [place] [address] [phone]   [place] [address], [place] [phone]   Planned Parenthood    [phone]   [facility]    [phone]   [facility] [address], [place] Phone:  [phone], Fax:  [phone] Hours of Operation:  9 am - 6 pm, M-F.  Also accepts Medicaid/Medicare and self-pay.  [facility] for [place], [address], [place] Phone: [phone], Fax: [phone]. Hours of Operation:  8:30 am - 5:30 pm, M-F.  Also accepts Medicaid and self-pay.  [facility] [place] [address] [place] Phone: [phone]   [place], [place], [place] [phone], Ext. [phone] Mondays & Thursdays: 7-9 AM.  First 15 patients are seen on a first come, first serve basis.    [place] Providers:  Organization         Address  Phone   Notes  [facility] [address], [place] [phone] Also accepts self-pay patients.  [facility] [address], [place]  [phone]   [place], [address], [place] [phone]   [facility] [address] [place] [phone]   Lucianne Lei [address], [place]   [phone] Only  accepts [place] Access [place] patients after they have their name applied to their card.   Self-Pay (no insurance) in [facility]:  Organization         Address  Phone   Notes  Sickle Cell Patients, [facility] [place] [phone]   [facility] [place] [phone]   [facility] [place]  [place], [place], [place] [phone]   [facility]/Dr. Osei-Bonsu  [address], [place] or [address], [place] [phone] Phone number for both [place] and [place] locations is the same.  [facility] [address] [place] [phone]   [facility] [address] [place] or [address] [phone]   [facility] [address] [place] [phone], phone; [phone], fax Sees patients 1st and 3rd Saturday of every month.  Must not qualify for public or private insurance (i.e. Medicaid, Medicare, [place] Health Choice, Veterans' Benefits)  Household income should be no more than 200% of the poverty level The clinic cannot treat you if you are pregnant or think you are pregnant  Sexually transmitted diseases are not treated at the clinic.    Dental Care: Organization         Address  Phone  Notes  [facility] Department of [facility] [phone] Accepts children up to age 31 who are enrolled in [place] or [place]; pregnant women with a Medicaid card; and children who have applied for Medicaid or [place] Health Choice, but were declined, whose parents can pay a reduced fee at time of service.  [facility] Department of [facility]  [address], [place] [phone] Accepts children up to age 7 who are enrolled in [place] or [place]; pregnant  women with  a Medicaid card; and children who have applied for Medicaid or [place] Health Choice, but were declined, whose parents can pay a reduced fee at time of service.  [place] [facility] PRO[facility]  Standard [phone] Patients are seen by appointment only. Walk-ins are not accepted. [place] will see patients 3 years of age and older. Monday - Tuesday (8am-5pm) Most Wednesdays (8:30-5pm) $30 per visit, cash only  [facility] Adult Dental Access PROGRAM  [address], [facility] [phone] Patients are seen by appointment only. Walk-ins are not accepted. [place] will see patients 94 years of age and older. One Wednesday Evening (Monthly: Volunteer Based).  $30 per visit, cash only  [place]  [phone] for adults; Children under age 53, call Graduate Pediatric Dentistry at [phone]. Children aged 67-14, please call [phone] to request a pediatric application.  Dental services are provided in all areas of dental care including fillings, crowns and bridges, complete and partial dentures, implants, gum treatment, root canals, and extractions. Preventive care is also provided. Treatment is provided to both adults and children. Patients are selected via a lottery and there is often a waiting list.   [facility] [address] [place]  [phone] [email or web address]   [facility] [address] [place], [place] [phone], Ext. [phone] Second and Fourth Thursday of each month, opens at 6:30 AM; Clinic ends at 9 AM.  Patients are seen on a first-come first-served basis, and a limited number are seen during each clinic.   [facility]  [address] [place], [place] [phone]   Eligibility Requirements You must have lived in [place], [place], or [place] counties for at least the last three months.   You cannot be eligible for state or federal sponsored  [employer], including [employer], [place], or [employer].   You generally cannot be eligible for healthcare insurance through your employer.    How to apply: Eligibility screenings are held every Tuesday and Wednesday afternoon from 1:00 pm until 4:00 pm. You do not need an appointment for the interview!  [facility] [address] [place], [place]   [place]  [place] Department  [place]  [phone]    Behavioral Health Resources in the Community: Intensive Outpatient Programs Organization         Address  Phone  Notes  [facility] [place] [address], [place], [place] [phone]   [facility] Outpatient [address], [place], [place]   ADS: [facility] [address], [place], [place]   [place] [address],  [place], [place] or [phone]   Substance Abuse Resources Organization         Address  Phone  Notes  [facility]  [phone]   [place]  [phone]   [facility]   Chinita Pester  [phone]   Residential & Outpatient Substance Abuse Program  [phone]   Psychological Services Organization         Address  Phone  Notes  [facility]  [place]  [phone]   [place] [address] [place] or [phone]    Mobile Crisis Teams Organization  Address  Phone  Notes  [facility], Mobile Crisis Care Unit  [phone]   [facility]  [address] [place], [place]   [facility] [address] [place] [phone]    Self-Help/Support Groups Organization         Address  Phone              Notes  [place] [place] - variety of support groups  [place] Call for more information  Narcotics Anonymous (NA), [facility] [address], [employer] [place]  2 meetings at this location   Special educational needs teacher         Address  Phone  Notes  [facility] [place],    [place]  [phone]   [facility]  [address] [place] [address], [place], [place]   [place], [place] [phone] Admissions: 8am-3pm M-F  [facility] [address]    [place], [place] [phone]   [facility] [address] [place], [place], [place]   [facility] [address],  [place], [place]   [facility] - Intensive Outpatient [address], [place], [place]   [facility] ([place]  [place], [place] [phone] or [phone]   [facility] ([facility] [address] [place], [place] Accepts Medicaid  [facility] [address]  [place] [phone] Substance Abuse/Addiction Treatment   [facility] Organization         Address  Phone  Notes  [facility]  [phone]   Domenic Schwab, PhD [address] Arlis Porta [place], [place]   [phone] or [phone]   [place], [place] [phone]   [facility] [address] [place], [place] [phone] Insurance/Medicaid/sponsorship through [facility] and Families [address]                                    [place], [place] [phone] [place] [address], [place] [phone]    Dr. Adele Schilder  [phone]   [facility] 1) [address], [place] 2) [place] 3)  [place] [address], [place] [phone]  [phone]   [place] [phone] or [phone] (After Hours)
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
## 2014-02-27 NOTE — ED Notes (Signed)
2 inch abrasion located under right nipple on right hand side. NO bruising noted.

## 2014-02-27 NOTE — ED Notes (Signed)
V/s taken at 16:05 when pt arrived

## 2014-02-28 NOTE — ED Provider Notes (Signed)
Medical screening examination/treatment/procedure(s) were performed by non-physician practitioner and as supervising physician I was immediately available for consultation/collaboration.   EKG Interpretation None       Threasa Beards, MD 02/28/14 [phone]

## 2014-03-05 DIAGNOSIS — M25559 Pain in unspecified hip: Secondary | ICD-10-CM | POA: Diagnosis not present
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
## 2014-11-10 ENCOUNTER — Emergency Department (INDEPENDENT_AMBULATORY_CARE_PROVIDER_SITE_OTHER)
Admission: EM | Admit: 2014-11-10 | Discharge: 2014-11-10 | Disposition: A | Discharge status: Home / Self Care | Payer: Medicare Other | Source: Home / Self Care | Attending: Family Medicine | Admitting: Family Medicine

## 2014-11-10 ENCOUNTER — Encounter (HOSPITAL_COMMUNITY): Payer: Self-pay | Admitting: *Deleted

## 2014-11-10 DIAGNOSIS — H6121 Impacted cerumen, right ear: Secondary | ICD-10-CM

## 2014-11-10 NOTE — ED Provider Notes (Signed)
CSN: [id number]     Arrival date & time 11/10/14  1927 History   First MD Initiated Contact with Patient 11/10/14 2027     No chief complaint on file.  (Consider location/radiation/quality/duration/timing/severity/associated sxs/prior Treatment) Patient is a 39 y.o. male presenting with ear pain. The history is provided by the patient and the spouse.  Otalgia Location:  Right Quality:  Dull Severity:  Mild Onset quality:  Sudden Duration:  3 days Progression:  Unchanged Chronicity:  New Context: loud noise   Relieved by:  None tried Worsened by:  Nothing tried Ineffective treatments:  None tried Associated symptoms: hearing loss   Associated symptoms: no ear discharge and no fever     Past Medical History  Diagnosis Date  . Polio   . Hypertension    Past Surgical History  Procedure Laterality Date  . Wrist surgery      right  . Leg surgery     Family History  Problem Relation Age of Onset  . Cancer Mother    History  Substance Use Topics  . Smoking status: Current Every Day Smoker    Types: Cigarettes  . Smokeless tobacco: Not on file  . Alcohol Use: No    Review of Systems  Constitutional: Negative.  Negative for fever.  HENT: Positive for ear pain and hearing loss. Negative for ear discharge.     Allergies  Review of patient's allergies indicates no known allergies.  Home Medications   Prior to Admission medications   Medication Sig Start Date End Date Taking? Authorizing Provider  cyclobenzaprine (FLEXERIL) 10 MG tablet Take 1 tablet (10 mg total) by mouth 2 (two) times daily as needed for muscle spasms. 02/27/14   Marissa Sciacca, PA-C  FLUoxetine (PROZAC) 20 MG capsule Take 20 mg by mouth daily.    Historical Provider, MD  gabapentin (NEURONTIN) 300 MG capsule Take 900 mg by mouth at bedtime.    Historical Provider, MD  HYDROcodone-acetaminophen (NORCO/VICODIN) 5-325 MG per tablet Take 1 tablet by mouth every 4 (four) hours as needed for moderate pain  or severe pain. 11/29/13   Clayton Bibles, PA-C  HYDROcodone-acetaminophen (NORCO/VICODIN) 5-325 MG per tablet Take 1 tablet by mouth every 6 (six) hours as needed for moderate pain or severe pain. 02/27/14   Marissa Sciacca, PA-C  ibuprofen (ADVIL,MOTRIN) 200 MG tablet Take 200 mg by mouth every 6 (six) hours as needed. Pain    Historical Provider, MD  ibuprofen (ADVIL,MOTRIN) 800 MG tablet Take 1 tablet (800 mg total) by mouth every 8 (eight) hours as needed for mild pain or moderate pain. 11/29/13   Clayton Bibles, PA-C  pseudoephedrine (SUDAFED) 30 MG tablet Take 30 mg by mouth every 4 (four) hours as needed. Congestion    Historical Provider, MD  traZODone (DESYREL) 100 MG tablet Take 100 mg by mouth at bedtime.    Historical Provider, MD   BP 130/84 mmHg  Pulse 88  Temp(Src) 98.3 F (36.8 C) (Oral)  Resp 18  SpO2 99% Physical Exam  Constitutional: He is oriented to person, place, and time. He appears well-developed and well-nourished. No distress.  HENT:  Head: Normocephalic.  Right Ear: Decreased hearing is noted.  Left Ear: External ear normal.  Ears:  Mouth/Throat: Oropharynx is clear and moist.  Eyes: Conjunctivae are normal. Pupils are equal, round, and reactive to light.  Neck: Normal range of motion. Neck supple.  Lymphadenopathy:    He has no cervical adenopathy.  Neurological: He is alert and oriented to person,  place, and time.  Skin: Skin is warm and dry.  Nursing note and vitals reviewed.   ED Course  Procedures (including critical care time) Labs Review Labs Reviewed - No data to display  Imaging Review No results found.   MDM   1. Cerumen impaction, right    Sx improved after irrig, , tm wnl.    Billy Fischer, MD 11/10/14 2056

## 2014-11-10 NOTE — ED Notes (Signed)
Can't hear out of R ear onset 3-4 days ago. No pain.

## 2015-01-05 ENCOUNTER — Encounter (HOSPITAL_COMMUNITY): Payer: Self-pay | Admitting: Neurology

## 2015-01-05 ENCOUNTER — Emergency Department (HOSPITAL_COMMUNITY)
Admission: EM | Admit: 2015-01-05 | Discharge: 2015-01-05 | Disposition: A | Discharge status: Home / Self Care | Payer: Medicare Other | Attending: Emergency Medicine | Admitting: Emergency Medicine

## 2015-01-05 DIAGNOSIS — Z79899 Other long term (current) drug therapy: Secondary | ICD-10-CM | POA: Diagnosis not present

## 2015-01-05 DIAGNOSIS — Z8612 Personal history of poliomyelitis: Secondary | ICD-10-CM | POA: Insufficient documentation

## 2015-01-05 DIAGNOSIS — I10 Essential (primary) hypertension: Secondary | ICD-10-CM | POA: Diagnosis not present

## 2015-01-05 DIAGNOSIS — R42 Dizziness and giddiness: Secondary | ICD-10-CM | POA: Diagnosis not present

## 2015-01-05 DIAGNOSIS — R55 Syncope and collapse: Secondary | ICD-10-CM | POA: Diagnosis not present

## 2015-01-05 DIAGNOSIS — Z720 Tobacco use: Secondary | ICD-10-CM | POA: Diagnosis not present

## 2015-01-05 DIAGNOSIS — R1110 Vomiting, unspecified: Secondary | ICD-10-CM | POA: Insufficient documentation

## 2015-01-05 DIAGNOSIS — R569 Unspecified convulsions: Secondary | ICD-10-CM | POA: Diagnosis present

## 2015-01-05 LAB — BASIC METABOLIC PANEL
ANION GAP: 16 — AB (ref 5–15)
BUN: <5 mg/dL — ABNORMAL LOW (ref 6–20)
CO2: 24 mmol/L (ref 22–32)
Calcium: 9 mg/dL (ref 8.9–10.3)
Chloride: 97 mmol/L — ABNORMAL LOW (ref 101–111)
Creatinine, Ser: 0.78 mg/dL (ref 0.61–1.24)
GFR calc non Af Amer: >60 mL/min (ref >60)
GLUCOSE: 101 mg/dL — AB (ref 65–99)
Potassium: 3.3 mmol/L — ABNORMAL LOW (ref 3.5–5.1)
SODIUM: 137 mmol/L (ref 135–145)

## 2015-01-05 LAB — CBC
HCT: 44.9 % (ref 39.0–52.0)
Hemoglobin: 15.8 g/dL (ref 13.0–17.0)
MCH: 32.4 pg (ref 26.0–34.0)
MCHC: 35.2 g/dL (ref 30.0–36.0)
MCV: 92.2 fL (ref 78.0–100.0)
PLATELETS: 156 10*3/uL (ref 150–400)
RBC: 4.87 MIL/uL (ref 4.22–5.81)
RDW: 13.2 % (ref 11.5–15.5)
WBC: 8.7 10*3/uL (ref 4.0–10.5)

## 2015-01-05 LAB — CBG MONITORING, ED: GLUCOSE-CAPILLARY: 103 mg/dL — AB (ref 65–99)

## 2015-01-05 MED ORDER — ONDANSETRON 4 MG PO TBDP
8.0000 mg | ORAL_TABLET | Freq: Once | ORAL | Status: AC
  Administered 2015-01-05: 8 mg via ORAL
  Filled 2015-01-05: qty 2

## 2015-01-05 MED ORDER — LORAZEPAM 2 MG/ML IJ SOLN
1.0000 mg | Freq: Once | INTRAMUSCULAR | Status: AC
  Administered 2015-01-05: 1 mg via INTRAVENOUS
  Filled 2015-01-05: qty 1

## 2015-01-05 MED ORDER — ONDANSETRON 4 MG PO TBDP: ORAL_TABLET | ORAL | Status: DC

## 2015-01-05 MED ORDER — SODIUM CHLORIDE 0.9 % IV BOLUS (SEPSIS)
2000.0000 mL | Freq: Once | INTRAVENOUS | Status: AC
  Administered 2015-01-05: 2000 mL via INTRAVENOUS

## 2015-01-05 NOTE — Discharge Instructions (Signed)
Near-Syncope Near-syncope (commonly known as near fainting) is sudden weakness, dizziness, or feeling like you might pass out. During an episode of near-syncope, you may also develop pale skin, have tunnel vision, or feel sick to your stomach (nauseous). Near-syncope may occur when getting up after sitting or while standing for a long time. It is caused by a sudden decrease in blood flow to the brain. This decrease can result from various causes or triggers, most of which are not serious. However, because near-syncope can sometimes be a sign of something serious, a medical evaluation is required. The specific cause is often not determined. HOME CARE INSTRUCTIONS  Monitor your condition for any changes. The following actions may help to alleviate any discomfort you are experiencing:  Have someone stay with you until you feel stable.  Lie down right away and prop your feet up if you start feeling like you might faint. Breathe deeply and steadily. Wait until all the symptoms have passed. Most of these episodes last only a few minutes. You may feel tired for several hours.   Drink enough fluids to keep your urine clear or pale yellow.   If you are taking blood pressure or heart medicine, get up slowly when seated or lying down. Take several minutes to sit and then stand. This can reduce dizziness.  Follow up with your health care provider as directed. SEEK IMMEDIATE MEDICAL CARE IF:   You have a severe headache.   You have unusual pain in the chest, abdomen, or back.   You are bleeding from the mouth or rectum, or you have black or tarry stool.   You have an irregular or very fast heartbeat.   You have repeated fainting or have seizure-like jerking during an episode.   You faint when sitting or lying down.   You have confusion.   You have difficulty walking.   You have severe weakness.   You have vision problems.  MAKE SURE YOU:   Understand these instructions.  Will  watch your condition.  Will get help right away if you are not doing well or get worse. Document Released: 06/12/2005 Document Revised: 06/17/2013 Document Reviewed: 11/15/2012 ExitCare Patient Information 2015 [employer]. This information is not intended to replace advice given to you by your health care provider. Make sure you discuss any questions you have with your health care provider.  

## 2015-01-05 NOTE — ED Notes (Signed)
Pt reports had seizure 1 hr ago, witnessed by friend. Was standing against truck, then fell back into truck and had 2 min seizure with upper body right and left arms shaking. Reports seizures with stress.

## 2015-01-05 NOTE — ED Notes (Signed)
  CBG 103  
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
## 2015-01-14 ENCOUNTER — Encounter (HOSPITAL_COMMUNITY): Payer: Self-pay | Admitting: Emergency Medicine

## 2015-01-14 ENCOUNTER — Emergency Department (HOSPITAL_COMMUNITY)
Admission: EM | Admit: 2015-01-14 | Discharge: 2015-01-14 | Disposition: A | Discharge status: Home / Self Care | Payer: Medicare Other | Attending: Emergency Medicine | Admitting: Emergency Medicine

## 2015-01-14 DIAGNOSIS — Z720 Tobacco use: Secondary | ICD-10-CM | POA: Diagnosis not present

## 2015-01-14 DIAGNOSIS — R079 Chest pain, unspecified: Secondary | ICD-10-CM | POA: Diagnosis present

## 2015-01-14 DIAGNOSIS — Z8619 Personal history of other infectious and parasitic diseases: Secondary | ICD-10-CM | POA: Insufficient documentation

## 2015-01-14 DIAGNOSIS — Z79899 Other long term (current) drug therapy: Secondary | ICD-10-CM | POA: Diagnosis not present

## 2015-01-14 DIAGNOSIS — F10129 Alcohol abuse with intoxication, unspecified: Secondary | ICD-10-CM | POA: Diagnosis not present

## 2015-01-14 DIAGNOSIS — I10 Essential (primary) hypertension: Secondary | ICD-10-CM | POA: Diagnosis not present

## 2015-01-14 DIAGNOSIS — Z7982 Long term (current) use of aspirin: Secondary | ICD-10-CM | POA: Insufficient documentation

## 2015-01-14 DIAGNOSIS — F10929 Alcohol use, unspecified with intoxication, unspecified: Secondary | ICD-10-CM

## 2015-01-14 HISTORY — DX: Unspecified convulsions: R56.9

## 2015-01-14 LAB — CBC WITH DIFFERENTIAL/PLATELET
BASOS PCT: 0 % (ref 0–1)
Basophils Absolute: 0 10*3/uL (ref 0.0–0.1)
EOS ABS: 0 10*3/uL (ref 0.0–0.7)
Eosinophils Relative: 0 % (ref 0–5)
HCT: 42.7 % (ref 39.0–52.0)
HEMOGLOBIN: 14.7 g/dL (ref 13.0–17.0)
LYMPHS PCT: 29 % (ref 12–46)
Lymphs Abs: 2.1 10*3/uL (ref 0.7–4.0)
MCH: 32.5 pg (ref 26.0–34.0)
MCHC: 34.4 g/dL (ref 30.0–36.0)
MCV: 94.5 fL (ref 78.0–100.0)
Monocytes Absolute: 0.7 10*3/uL (ref 0.1–1.0)
Monocytes Relative: 10 % (ref 3–12)
Neutro Abs: 4.5 10*3/uL (ref 1.7–7.7)
Neutrophils Relative %: 61 % (ref 43–77)
PLATELETS: 165 10*3/uL (ref 150–400)
RBC: 4.52 MIL/uL (ref 4.22–5.81)
RDW: 13.3 % (ref 11.5–15.5)
WBC: 7.4 10*3/uL (ref 4.0–10.5)

## 2015-01-14 LAB — COMPREHENSIVE METABOLIC PANEL
ALT: 31 U/L (ref 17–63)
ANION GAP: 12 (ref 5–15)
AST: 43 U/L — ABNORMAL HIGH (ref 15–41)
Albumin: 3.8 g/dL (ref 3.5–5.0)
Alkaline Phosphatase: 93 U/L (ref 38–126)
BUN: 5 mg/dL — ABNORMAL LOW (ref 6–20)
CO2: 27 mmol/L (ref 22–32)
CREATININE: 0.84 mg/dL (ref 0.61–1.24)
Calcium: 8.7 mg/dL — ABNORMAL LOW (ref 8.9–10.3)
Chloride: 103 mmol/L (ref 101–111)
GFR calc non Af Amer: >60 mL/min (ref >60)
Glucose, Bld: 101 mg/dL — ABNORMAL HIGH (ref 65–99)
POTASSIUM: 3.3 mmol/L — AB (ref 3.5–5.1)
Sodium: 142 mmol/L (ref 135–145)
Total Bilirubin: 0.2 mg/dL — ABNORMAL LOW (ref 0.3–1.2)
Total Protein: 6.9 g/dL (ref 6.5–8.1)

## 2015-01-14 LAB — RAPID URINE DRUG SCREEN, HOSP PERFORMED
Amphetamines: NOT DETECTED
BARBITURATES: NOT DETECTED
Benzodiazepines: NOT DETECTED
Cocaine: NOT DETECTED
Opiates: NOT DETECTED
TETRAHYDROCANNABINOL: NOT DETECTED

## 2015-01-14 LAB — ETHANOL: Alcohol, Ethyl (B): 294 mg/dL — ABNORMAL HIGH (ref <5)

## 2015-01-14 MED ORDER — ADULT MULTIVITAMIN W/MINERALS CH
1.0000 | ORAL_TABLET | Freq: Every day | ORAL | Status: DC
  Administered 2015-01-14: 1 via ORAL
  Filled 2015-01-14: qty 1

## 2015-01-14 MED ORDER — LORAZEPAM 1 MG PO TABS: 1.0000 mg | ORAL_TABLET | Freq: Four times a day (QID) | ORAL | Status: DC | PRN

## 2015-01-14 MED ORDER — CHLORDIAZEPOXIDE HCL 25 MG PO CAPS: ORAL_CAPSULE | ORAL | Status: DC

## 2015-01-14 MED ORDER — LORAZEPAM 2 MG/ML IJ SOLN
0.0000 mg | Freq: Four times a day (QID) | INTRAMUSCULAR | Status: DC
  Administered 2015-01-14: 1 mg via INTRAVENOUS
  Filled 2015-01-14: qty 1

## 2015-01-14 MED ORDER — LORAZEPAM 2 MG/ML IJ SOLN
0.0000 mg | Freq: Two times a day (BID) | INTRAMUSCULAR | Status: DC
Start: 2015-01-16 — End: 2015-01-14

## 2015-01-14 MED ORDER — LORAZEPAM 2 MG/ML IJ SOLN: 1.0000 mg | Freq: Four times a day (QID) | INTRAMUSCULAR | Status: DC | PRN

## 2015-01-14 MED ORDER — FOLIC ACID 1 MG PO TABS
1.0000 mg | ORAL_TABLET | Freq: Every day | ORAL | Status: DC
  Administered 2015-01-14: 1 mg via ORAL
  Filled 2015-01-14: qty 1

## 2015-01-14 MED ORDER — ONDANSETRON 4 MG PO TBDP: ORAL_TABLET | ORAL | Status: DC

## 2015-01-14 MED ORDER — THIAMINE HCL 100 MG/ML IJ SOLN
100.0000 mg | Freq: Every day | INTRAMUSCULAR | Status: DC
  Administered 2015-01-14: 100 mg via INTRAVENOUS
  Filled 2015-01-14: qty 2

## 2015-01-14 NOTE — ED Notes (Signed)
Seizure pads placed @ 11:40

## 2015-01-14 NOTE — ED Notes (Addendum)
Pt brought in for having seizure last night.  Pt last use of ETOH was this morning.  Pt states that he has history of seizures.  Pt also has polio. Pt states he feeling nauseated and having right sided chest pain that started this am.

## 2015-01-14 NOTE — ED Provider Notes (Signed)
CSN: [id number]     Arrival date & time 01/14/15  1131 History   First MD Initiated Contact with Patient 01/14/15 1136     Chief Complaint  Patient presents with  . Seizures  . Withdrawal  . Chest Pain   HPI  Pt has history of alcohol abuse.  Pt has been drinking alcohol for years.  He tried to stop drinking about a week ago.  Since he stopped drinking he has been having shakiness and weakness.  He also has had seizures.  Pt had a seizure last night.  He started drinking alcohol again this morning.  He has been having nausea.  He also had some pain on the right side of his chest.   Past Medical History  Diagnosis Date  . Polio   . Hypertension   . Seizures    Past Surgical History  Procedure Laterality Date  . Wrist surgery      right  . Leg surgery  1988    8 surgeries-hips and legs for walking after getting polio- last one 1988   Family History  Problem Relation Age of Onset  . Cancer Mother    History  Substance Use Topics  . Smoking status: Current Every Day Smoker -- 1.00 packs/day    Types: Cigarettes  . Smokeless tobacco: Not on file  . Alcohol Use: 25.2 oz/week    42 Cans of beer per week    Review of Systems  Constitutional: Negative for fever.  Cardiovascular: Positive for chest pain.  All other systems reviewed and are negative.     Allergies  Review of patient's allergies indicates no known allergies.  Home Medications   Prior to Admission medications   Medication Sig Start Date End Date Taking? Authorizing Provider  aspirin 325 MG tablet Take 325 mg by mouth daily.   Yes Historical Provider, MD  cetirizine (ZYRTEC) 10 MG chewable tablet Chew 10 mg by mouth daily.   Yes Historical Provider, MD  chlordiazePOXIDE (LIBRIUM) 25 MG capsule 50mg  PO TID x 1D, then 25-50mg  PO BID X 1D, then 25-50mg  PO QD X 1D 01/14/15   Dorie Rank, MD  cyclobenzaprine (FLEXERIL) 10 MG tablet Take 1 tablet (10 mg total) by mouth 2 (two) times daily as needed for muscle  spasms. Patient not taking: Reported on 01/05/2015 02/27/14   Marissa Sciacca, PA-C  HYDROcodone-acetaminophen (NORCO/VICODIN) 5-325 MG per tablet Take 1 tablet by mouth every 4 (four) hours as needed for moderate pain or severe pain. Patient not taking: Reported on 01/05/2015 11/29/13   Clayton Bibles, PA-C  HYDROcodone-acetaminophen (NORCO/VICODIN) 5-325 MG per tablet Take 1 tablet by mouth every 6 (six) hours as needed for moderate pain or severe pain. Patient not taking: Reported on 01/05/2015 02/27/14   Marissa Sciacca, PA-C  ibuprofen (ADVIL,MOTRIN) 800 MG tablet Take 1 tablet (800 mg total) by mouth every 8 (eight) hours as needed for mild pain or moderate pain. Patient not taking: Reported on 01/05/2015 11/29/13   Clayton Bibles, PA-C  ondansetron (ZOFRAN ODT) 4 MG disintegrating tablet 4mg  ODT q4 hours prn nausea/vomit 01/14/15   Dorie Rank, MD   BP 112/67 mmHg  Pulse 95  Temp(Src) 98.2 F (36.8 C) (Oral)  Resp 19  SpO2 99% Physical Exam  Constitutional: He appears well-developed and well-nourished. No distress.  HENT:  Head: Normocephalic and atraumatic.  Right Ear: External ear normal.  Left Ear: External ear normal.  Eyes: Conjunctivae are normal. Right eye exhibits no discharge. Left eye exhibits no discharge.  No scleral icterus.  Neck: Neck supple. No tracheal deviation present.  Cardiovascular: Normal rate, regular rhythm and intact distal pulses.   Pulmonary/Chest: Effort normal and breath sounds normal. No stridor. No respiratory distress. He has no wheezes. He has no rales.  Abdominal: Soft. Bowel sounds are normal. He exhibits no distension. There is no tenderness. There is no rebound and no guarding.  Musculoskeletal: He exhibits no edema or tenderness.  Neurological: He is alert. He displays atrophy and tremor. No cranial nerve deficit (no facial droop, extraocular movements intact, no slurred speech) or sensory deficit. He exhibits abnormal muscle tone. He displays no seizure activity.  Coordination normal.  Atrophy right lower extrem, decreased strength  Skin: Skin is warm and dry. No rash noted.  Psychiatric: He has a normal mood and affect.  Nursing note and vitals reviewed.   ED Course  Procedures (including critical care time) Labs Review Labs Reviewed  COMPREHENSIVE METABOLIC PANEL - Abnormal; Notable for the following:    Potassium 3.3 (*)    Glucose, Bld 101 (*)    BUN 5 (*)    Calcium 8.7 (*)    AST 43 (*)    Total Bilirubin 0.2 (*)    All other components within normal limits  ETHANOL - Abnormal; Notable for the following:    Alcohol, Ethyl (B) 294 (*)    All other components within normal limits  CBC WITH DIFFERENTIAL/PLATELET  URINE RAPID DRUG SCREEN, HOSP PERFORMED    Imaging Review No results found.   EKG Interpretation   Date/Time:  Thursday January 14 2015 11:40:25 EDT Ventricular Rate:  101 PR Interval:  149 QRS Duration: 100 QT Interval:  366 QTC Calculation: 474 R Axis:   79 Text Interpretation:  Sinus tachycardia Since last tracing rate faster  Confirmed by Shatavia Santor  MD-J, Murad Staples ([id number]) on 01/14/2015 11:48:30 AM     Medications  thiamine (B-1) injection 100 mg (100 mg Intravenous Given 01/14/15 1221)  LORazepam (ATIVAN) tablet 1 mg (not administered)    Or  LORazepam (ATIVAN) injection 1 mg (not administered)  folic acid (FOLVITE) tablet 1 mg (1 mg Oral Given 01/14/15 1220)  multivitamin with minerals tablet 1 tablet (1 tablet Oral Given 01/14/15 1220)  LORazepam (ATIVAN) injection 0-4 mg (1 mg Intravenous Given 01/14/15 1221)    Followed by  LORazepam (ATIVAN) injection 0-4 mg (not administered)    MDM   Final diagnoses:  Alcohol intoxication, with unspecified complication    Patient's labs are consistent with acute alcohol intoxication. The patient was monitored in the emergency department for several hours. He was not tachycardic or tremulous. He had no nausea and vomiting.  Patient is interested in assistance with his alcohol  abuse. Patient resources were provided. Patient will be discharged home with a prescription for Librium and Zofran.  Findings and plan were also discussed with the patient's family member.    Dorie Rank, MD 01/14/15 [phone]

## 2015-01-14 NOTE — ED Notes (Signed)
Pt reports that he has been trying to detox from ETOH x 1 week.  Sts n/v/d and intermittent seizure-like activity x 1 week.  Pt admits drinking a Twisted Tea today "to help with symptoms."

## 2015-01-14 NOTE — ED Notes (Signed)
Pt is awake and reports "I feel like sh*t."  Pt cannot elaborate on how he is feeling.

## 2015-01-14 NOTE — Discharge Instructions (Signed)
Alcohol Intoxication Alcohol intoxication occurs when you drink enough alcohol that it affects your ability to function. It can be mild or very severe. Drinking a lot of alcohol in a short time is called binge drinking. This can be very harmful. Drinking alcohol can also be more dangerous if you are taking medicines or other drugs. Some of the effects caused by alcohol may include:  Loss of coordination.  Changes in mood and behavior.  Unclear thinking.  Trouble talking (slurred speech).  Throwing up (vomiting).  Confusion.  Slowed breathing.  Twitching and shaking (seizures).  Loss of consciousness. HOME CARE  Do not drive after drinking alcohol.  Drink enough water and fluids to keep your pee (urine) clear or pale yellow. Avoid caffeine.  Only take medicine as told by your doctor. GET HELP IF:  You throw up (vomit) many times.  You do not feel better after a few days.  You frequently have alcohol intoxication. Your doctor can help decide if you should see a substance use treatment counselor. GET HELP RIGHT AWAY IF:  You become shaky when you stop drinking.  You have twitching and shaking.  You throw up blood. It may look bright red or like coffee grounds.  You notice blood in your poop (bowel movements).  You become lightheaded or pass out (faint). MAKE SURE YOU:   Understand these instructions.  Will watch your condition.  Will get help right away if you are not doing well or get worse. Document Released: 11/29/2007 Document Revised: 02/12/2013 Document Reviewed: 11/15/2012 [facility] Patient Information 2015 [place], [place]. This information is not intended to replace advice given to you by your health care provider. Make sure you discuss any questions you have with your health care provider. Substance Abuse Treatment Programs  Intensive Outpatient Programs [facility]     [address], [place]       [facility] [place] [address] [place], [place]  [facility]     (Inpatient and outpatient)     [address]           [place] [phone] (Suboxone and Methadone)  [place], [place] [address]      [place] [address] [place], [place]  [facility] [place] (Outpatient/Inpatient, Chemical)    (insurance only) [phone]             [facility] ([place]) [place], [place]     [facility]     [address]     [place], [place]       [facility] (for caregivers and family) [phone] [address] Kristeen Mans. [place], [place]      Residential Treatment Programs [facility]      [address] [place], [place] [address]  [phone]       [facility] [address] [place], [place] [address] [phone]  [facility] [place]        [phone]       [facility] [place] [phone]  [facility] ([place]  [place], [place] or [place], [address] [phone]  [facility]    [address]      [place], [place]       [facility] [address] [place], [place]  [place]   [address] [place], [place] [address]     [phone]      Admissions: 8am-3pm M-F  [facility] (RTS) [address] [place], [place]  [facility] Program: Residential Program [phone] Days)   [place], [place] or [phone]     [facility]: [place],  [place] (Walk in Hours over the weekend or by referral)  [facility] [place], [place], [place] [address] [phone]  Crisis Mobile: [facility]:  [phone] (for crisis response 24 hours a day) [facility] Hotline:      [phone] Outpatient Psychiatry and Counseling  [facility]: Mobile Crisis Management 24 hours:  [phone]  [facility] [employer] sliding scale fee and walk in schedule: M-F 8am-12pm/1pm-3pm [place], [place] [address] [address] [place], [place] [address] [phone]  [facility] (Formerly known as [facility])- new patient walk-in appointments available Monday - Friday 8am -3pm.          [address] [place], [place] [address] [phone] or crisis line- [facility]/ Intensive Outpatient Therapy Program [place], [place] [address] [place]      [phone] [place] [place], [place] [address]                 Deputy   [facility] [phone]. [place], [place] [address]   [employer] [facility]          [address] Johnette Abraham  [place], [place] [address]       [phone]  [place], [place], [place] [address] [phone]  [facility]    [address] [place],  [address]     [place], [place]     [place] [address]     [phone]       [facility] [address] [place] [address]  [place], [place], [place], [place] [address] [address]     [address]     [place], [place] [address]     [phone]       [facility] [address] [place], [place] [address] [phone] Resources for Temporary Residential Assistance/Crisis [facility]) M-F 8am-3pm   [address] [place], [place] [address]   [phone] Services include: laundry, barbering, support groups, case management, phone  & computer access, showers, AA/NA mtgs, mental health/substance abuse nurse, job skills class, disability information, VA assistance, spiritual classes, etc.   [facility]   [address] [place]     [place]              [employer] (women and children)       [place]. [place], [place] [address] [phone] [email or web address] for application and process Application Required  [facility]   [address]    [place] [address]     [phone]                    [place], [place] [address] [phone]([email or web address] application appt.) Application Required  [facility] (women only)    [address]     [place], [place] [address]     [phone]      Intake starts 6pm daily Need valid ID, SSC, & Police report [employer] [address] [place], [place] [phone] Application Required  [employer] (men only)     [place].      [place], [place]       [place] (Pregnant women only) [address] [place], [place]  [facility]      [place] [address]      [place], [place] [address]     [phone]             [facility] [address] [place], [place] 90 day  commitment/SA/Application process  [facility](men only)     [address]     [place], [place]       Check-in at [facility] [address] [place], [place] [address] [phone] Men/Women/Women and Children must be there by 7 pm  [place], [place]

## 2015-01-14 NOTE — ED Notes (Signed)
Made first request for urine sample

## 2015-01-14 NOTE — ED Notes (Signed)
MD at bedside. 

## 2015-01-14 NOTE — ED Notes (Signed)
Pt sleeping soundly.  EDP and this RN had an extensive conversation w/ significant other regarding Pt's current condition/lab results and possible future treatment options.  EDP made her aware we would keep the Pt for observation x 2-3 hours.  Significant other verbalized understanding.

## 2015-01-18 ENCOUNTER — Ambulatory Visit: Payer: Medicare Other | Admitting: Family

## 2015-02-01 ENCOUNTER — Encounter: Payer: Self-pay | Admitting: Family

## 2015-02-01 ENCOUNTER — Ambulatory Visit (INDEPENDENT_AMBULATORY_CARE_PROVIDER_SITE_OTHER): Payer: Medicare Other | Admitting: Family

## 2015-02-01 VITALS — BP 120/90 | HR 79 | Temp 98.0°F | Resp 18 | Ht 64.0 in | Wt 132.8 lb

## 2015-02-01 DIAGNOSIS — R21 Rash and other nonspecific skin eruption: Secondary | ICD-10-CM

## 2015-02-01 DIAGNOSIS — F1010 Alcohol abuse, uncomplicated: Secondary | ICD-10-CM | POA: Diagnosis not present

## 2015-02-01 DIAGNOSIS — J302 Other seasonal allergic rhinitis: Secondary | ICD-10-CM

## 2015-02-01 DIAGNOSIS — F411 Generalized anxiety disorder: Secondary | ICD-10-CM | POA: Insufficient documentation

## 2015-02-01 MED ORDER — FLUTICASONE PROPIONATE 50 MCG/ACT NA SUSP: 2.0000 | Freq: Every day | NASAL | Status: DC

## 2015-02-01 MED ORDER — LEVOCETIRIZINE DIHYDROCHLORIDE 5 MG PO TABS: 5.0000 mg | ORAL_TABLET | Freq: Every evening | ORAL | Status: DC

## 2015-02-01 MED ORDER — DOXYCYCLINE HYCLATE 100 MG PO TABS: 100.0000 mg | ORAL_TABLET | Freq: Two times a day (BID) | ORAL | Status: DC

## 2015-02-01 NOTE — Assessment & Plan Note (Addendum)
Most likely related to his underlying depression and anxiety. Indicates the previously prescribed librium does not work for him. Recommend AA meetings and will refer to psychology. Will unlikely be able to detox independently and may need outpatient therapy or brief detox facility. Taper alcohol slowly. Refer to psychology for counseling. Discussed signs and symptoms of withdrawal and when to seek medical attention.

## 2015-02-01 NOTE — Assessment & Plan Note (Signed)
Seasonal allergies are labile with current regimen of Allegra. Discontinue Allegra and start Xyzal. Start flonase. Follow up if symptoms worsen or do not improve with medication changes.

## 2015-02-01 NOTE — Progress Notes (Signed)
Subjective:    Patient ID: Keith Parker, male    DOB: 13-Feb-1976, 39 y.o.   MRN: [id number]  Chief Complaint  Patient presents with  . Establish Care    would like to talk about his acne, allergies, also wants to talk about alcohol problems     HPI:  Keith Parker is a 39 y.o. male with a PMH of post polio syndrome who presents today for an office visit to establish care.   1.) Anxiety - Associated symptom of anxiety has been going on for years. Stressors include life events and bills. Modifying factors include alcohol and cigarettes which help to reduce his anxiety. Recently seen in the ED and was diagnosed with alcohol intoxication for   2.) Allergies - Currently maintained with Allegra. Indicates that he does have exacerbations from time to time and would like to try additional medication.   3.) Acne/cysts  - Associated symptom of acne located on his back has been going on for about his whole life. Received an oral medication that did not have a chance to work before it was discontinued.   No Known Allergies   Outpatient Prescriptions Prior to Visit  Medication Sig Dispense Refill  . aspirin 325 MG tablet Take 325 mg by mouth daily.    . cetirizine (ZYRTEC) 10 MG chewable tablet Chew 10 mg by mouth daily.    . chlordiazePOXIDE (LIBRIUM) 25 MG capsule 50mg  PO TID x 1D, then 25-50mg  PO BID X 1D, then 25-50mg  PO QD X 1D 10 capsule 0  . ondansetron (ZOFRAN ODT) 4 MG disintegrating tablet 4mg  ODT q4 hours prn nausea/vomit 10 tablet 0   No facility-administered medications prior to visit.     Past Medical History  Diagnosis Date  . Polio   . Hypertension   . Arthritis   . Depression   . GERD (gastroesophageal reflux disease)   . Allergy   . Seizures     last seizure about 7 years ago.   . Substance abuse     Alcohol abuse     Past Surgical History  Procedure Laterality Date  . Wrist surgery      right  . Leg surgery  1988    8 surgeries-hips and legs for walking  after getting polio- last one 1988     Family History  Problem Relation Age of Onset  . Cancer Mother     Breast  . Arthritis Mother   . Alcohol abuse Father   . Arthritis Father   . Hypertension Father   . Cancer Maternal Grandmother     Breast  . Hypertension Maternal Grandmother   . Hypertension Maternal Grandfather   . Hypertension Paternal Grandmother   . Hypertension Paternal Grandfather      History   Social History  . Marital Status: Single    Spouse Name: N/A  . Number of Children: 2  . Years of Education: 12   Occupational History  . Disabled     Post polio syndrome / Depression   Social History Main Topics  . Smoking status: Current Every Day Smoker -- 2.00 packs/day for 25 years    Types: Cigarettes  . Smokeless tobacco: Never Used  . Alcohol Use: 42.0 oz/week    70 Cans of beer per week  . Drug Use: No  . Sexual Activity: Not on file   Other Topics Concern  . Not on file   Social History Narrative   Fun: Workout   Denies  religious beliefs effecting health care.     Review of Systems  Constitutional: Negative for fever and chills.  Respiratory: Negative for chest tightness and shortness of breath.   Cardiovascular: Negative for chest pain, palpitations and leg swelling.  Skin: Positive for rash.  Neurological: Negative for tremors and seizures.  Psychiatric/Behavioral: Negative for suicidal ideas, hallucinations and behavioral problems. The patient is nervous/anxious.       Objective:    BP 120/90 mmHg  Pulse 79  Temp(Src) 98 F (36.7 C) (Oral)  Resp 18  Ht 5\' 4"  (1.626 m)  Wt 132 lb 12.8 oz (60.238 kg)  BMI 22.78 kg/m2  SpO2 97% Nursing note and vital signs reviewed.  Physical Exam  Constitutional: He is oriented to person, place, and time. He appears well-developed and well-nourished. No distress.  Walks with a limp, dressed appropriately, appears older than his stated age.   Cardiovascular: Normal rate, regular rhythm, normal  heart sounds and intact distal pulses.   Pulmonary/Chest: Effort normal and breath sounds normal.  Neurological: He is alert and oriented to person, place, and time.  Skin: Skin is warm and dry.  Multiple cysts on back of varying sizes and in varying phases of healing.   Psychiatric: He has a normal mood and affect. His behavior is normal. Judgment and thought content normal.       Assessment & Plan:   Problem List Items Addressed This Visit      Respiratory   Seasonal allergies    Seasonal allergies are labile with current regimen of Allegra. Discontinue Allegra and start Xyzal. Start flonase. Follow up if symptoms worsen or do not improve with medication changes.       Relevant Medications   levocetirizine (XYZAL) 5 MG tablet   fluticasone (FLONASE) 50 MCG/ACT nasal spray     Musculoskeletal and Integument   Rash and nonspecific skin eruption    Multiple cysts of varying healing of undetermined origin. Start doxycycline to cover to MRSA infection. Refer to dermatology for further evaluation.       Relevant Medications   doxycycline (VIBRA-TABS) 100 MG tablet   Other Relevant Orders   Ambulatory referral to Psychology   Ambulatory referral to Dermatology     Other   Generalized anxiety disorder - Primary    Related to previous situations. Currently uses alcohol and tobacco to reduce anxiety. No medication is appropriate at this time secondary to alcohol use. Refer to counseling for further suggestions for management.       Alcohol abuse    Most likely related to his underlying depression and anxiety. Indicates the previously prescribed librium does not work for him. Recommend AA meetings and will refer to psychology. Will unlikely be able to detox independently and may need outpatient therapy or brief detox facility. Taper alcohol slowly. Refer to psychology for counseling. Discussed signs and symptoms of withdrawal and when to seek medical attention.

## 2015-02-01 NOTE — Assessment & Plan Note (Signed)
Related to previous situations. Currently uses alcohol and tobacco to reduce anxiety. No medication is appropriate at this time secondary to alcohol use. Refer to counseling for further suggestions for management.

## 2015-02-01 NOTE — Progress Notes (Signed)
Pre visit review using our clinic review tool, if applicable. No additional management support is needed unless otherwise documented below in the visit note. 

## 2015-02-01 NOTE — Assessment & Plan Note (Signed)
Multiple cysts of varying healing of undetermined origin. Start doxycycline to cover to MRSA infection. Refer to dermatology for further evaluation.

## 2015-02-01 NOTE — Patient Instructions (Signed)
Thank you for choosing [facility].  Summary/Instructions:  Your prescription(s) have been submitted to your pharmacy or been printed and provided for you. Please take as directed and contact our office if you believe you are having problem(s) with the medication(s) or have any questions.  If your symptoms worsen or fail to improve, please contact our office for further instruction, or in case of emergency go directly to the emergency room at the closest medical facility.   Generalized Anxiety Disorder Generalized anxiety disorder (GAD) is a mental disorder. It interferes with life functions, including relationships, work, and school. GAD is different from normal anxiety, which everyone experiences at some point in their lives in response to specific life events and activities. Normal anxiety actually helps [place] prepare for and get through these life events and activities. Normal anxiety goes away after the event or activity is over.  GAD causes anxiety that is not necessarily related to specific events or activities. It also causes excess anxiety in proportion to specific events or activities. The anxiety associated with GAD is also difficult to control. GAD can vary from mild to severe. People with severe GAD can have intense waves of anxiety with physical symptoms (panic attacks).  SYMPTOMS The anxiety and worry associated with GAD are difficult to control. This anxiety and worry are related to many life events and activities and also occur more days than not for 6 months or longer. People with GAD also have three or more of the following symptoms (one or more in children):  Restlessness.   Fatigue.  Difficulty concentrating.   Irritability.  Muscle tension.  Difficulty sleeping or unsatisfying sleep. DIAGNOSIS GAD is diagnosed through an assessment by your health care provider. Your health care provider will ask you questions aboutyour mood,physical symptoms, and events in  your life. Your health care provider may ask you about your medical history and use of alcohol or drugs, including prescription medicines. Your health care provider may also do a physical exam and blood tests. Certain medical conditions and the use of certain substances can cause symptoms similar to those associated with GAD. Your health care provider may refer you to a mental health specialist for further evaluation. TREATMENT The following therapies are usually used to treat GAD:   Medication. Antidepressant medication usually is prescribed for long-term daily control. Antianxiety medicines may be added in severe cases, especially when panic attacks occur.   Talk therapy (psychotherapy). Certain types of talk therapy can be helpful in treating GAD by providing support, education, and guidance. A form of talk therapy called cognitive behavioral therapy can teach you healthy ways to think about and react to daily life events and activities.  Stress managementtechniques. These include yoga, meditation, and exercise and can be very helpful when they are practiced regularly. A mental health specialist can help determine which treatment is best for you. Some people see improvement with one therapy. However, other people require a combination of therapies. Document Released: 10/07/2012 Document Revised: 10/27/2013 Document Reviewed: 10/07/2012 [facility] Patient Information 2015 [place], [place]. This information is not intended to replace advice given to you by your health care provider. Make sure you discuss any questions you have with your health care provider.  Alcohol Use Disorder Alcohol use disorder is a mental disorder. It is not a one-time incident of heavy drinking. Alcohol use disorder is the excessive and uncontrollable use of alcohol over time that leads to problems with functioning in one or more areas of daily living. People with this  disorder risk harming themselves and others when they drink  to excess. Alcohol use disorder also can cause other mental disorders, such as mood and anxiety disorders, and serious physical problems. People with alcohol use disorder often misuse other drugs.  Alcohol use disorder is common and widespread. Some people with this disorder drink alcohol to cope with or escape from negative life events. Others drink to relieve chronic pain or symptoms of mental illness. People with a family history of alcohol use disorder are at higher risk of losing control and using alcohol to excess.  SYMPTOMS  Signs and symptoms of alcohol use disorder may include the following:   Consumption ofalcohol inlarger amounts or over a longer period of time than intended.  Multiple unsuccessful attempts to cutdown or control alcohol use.   A great deal of time spent obtaining alcohol, using alcohol, or recovering from the effects of alcohol (hangover).  A strong desire or urge to use alcohol (cravings).   Continued use of alcohol despite problems at work, school, or home because of alcohol use.   Continued use of alcohol despite problems in relationships because of alcohol use.  Continued use of alcohol in situations when it is physically hazardous, such as driving a car.  Continued use of alcohol despite awareness of a physical or psychological problem that is likely related to alcohol use. Physical problems related to alcohol use can involve the brain, heart, liver, stomach, and intestines. Psychological problems related to alcohol use include intoxication, depression, anxiety, psychosis, delirium, and dementia.   The need for increased amounts of alcohol to achieve the same desired effect, or a decreased effect from the consumption of the same amount of alcohol (tolerance).  Withdrawal symptoms upon reducing or stopping alcohol use, or alcohol use to reduce or avoid withdrawal symptoms. Withdrawal symptoms include:  Racing heart.  Hand tremor.  Difficulty  sleeping.  Nausea.  Vomiting.  Hallucinations.  Restlessness.  Seizures. DIAGNOSIS Alcohol use disorder is diagnosed through an assessment by your health care provider. Your health care provider may start by asking three or four questions to screen for excessive or problematic alcohol use. To confirm a diagnosis of alcohol use disorder, at least two symptoms must be present within a 32-month period. The severity of alcohol use disorder depends on the number of symptoms:  Mild--two or three.  Moderate--four or five.  Severe--six or more. Your health care provider may perform a physical exam or use results from lab tests to see if you have physical problems resulting from alcohol use. Your health care provider may refer you to a mental health professional for evaluation. TREATMENT  Some people with alcohol use disorder are able to reduce their alcohol use to low-risk levels. Some people with alcohol use disorder need to quit drinking alcohol. When necessary, mental health professionals with specialized training in substance use treatment can help. Your health care provider can help you decide how severe your alcohol use disorder is and what type of treatment you need. The following forms of treatment are available:   Detoxification. Detoxification involves the use of prescription medicines to prevent alcohol withdrawal symptoms in the first week after quitting. This is important for people with a history of symptoms of withdrawal and for heavy drinkers who are likely to have withdrawal symptoms. Alcohol withdrawal can be dangerous and, in severe cases, cause death. Detoxification is usually provided in a hospital or in-patient substance use treatment facility.  Counseling or talk therapy. Talk therapy is provided by substance  use treatment counselors. It addresses the reasons people use alcohol and ways to keep them from drinking again. The goals of talk therapy are to help people with alcohol  use disorder find healthy activities and ways to cope with life stress, to identify and avoid triggers for alcohol use, and to handle cravings, which can cause relapse.  Medicines.Different medicines can help treat alcohol use disorder through the following actions:  Decrease alcohol cravings.  Decrease the positive reward response felt from alcohol use.  Produce an uncomfortable physical reaction when alcohol is used (aversion therapy).  Support groups. Support groups are run by people who have quit drinking. They provide emotional support, advice, and guidance. These forms of treatment are often combined. Some people with alcohol use disorder benefit from intensive combination treatment provided by specialized substance use treatment centers. Both inpatient and outpatient treatment programs are available. Document Released: 07/20/2004 Document Revised: 10/27/2013 Document Reviewed: 09/19/2012 [facility] Patient Information 2015 [place], [place]. This information is not intended to replace advice given to you by your health care provider. Make sure you discuss any questions you have with your health care provider.

## 2015-02-10 ENCOUNTER — Encounter: Payer: Self-pay | Admitting: Family

## 2015-02-15 ENCOUNTER — Telehealth: Payer: Self-pay

## 2015-02-15 NOTE — Telephone Encounter (Signed)
Call to Keith Parker to educate on AWV prior to CPE with [facility]; Need to review for Medicare effective date as this may be the first Medicare visit No message could be left on the number; stated this was not a valid number.

## 2015-02-18 NOTE — Telephone Encounter (Signed)
fup call to educate regarding AWV; the telephone number is non working. Other number is roommates cell but did not attempt due to privacy

## 2015-03-04 ENCOUNTER — Encounter: Payer: Medicare Other | Admitting: Family

## 2015-04-07 DIAGNOSIS — Z23 Encounter for immunization: Secondary | ICD-10-CM | POA: Diagnosis not present

## 2015-07-07 ENCOUNTER — Encounter: Payer: Medicare Other | Admitting: Family

## 2016-10-31 ENCOUNTER — Other Ambulatory Visit: Payer: Self-pay | Admitting: Family

## 2016-10-31 DIAGNOSIS — J302 Other seasonal allergic rhinitis: Secondary | ICD-10-CM

## 2017-01-15 ENCOUNTER — Emergency Department (HOSPITAL_COMMUNITY)
Admission: EM | Admit: 2017-01-15 | Discharge: 2017-01-15 | Disposition: A | Discharge status: Home / Self Care | Payer: Medicare Other | Attending: Physician Assistant | Admitting: Physician Assistant

## 2017-01-15 ENCOUNTER — Encounter (HOSPITAL_COMMUNITY): Payer: Self-pay | Admitting: *Deleted

## 2017-01-15 ENCOUNTER — Emergency Department (HOSPITAL_BASED_OUTPATIENT_CLINIC_OR_DEPARTMENT_OTHER)
Admit: 2017-01-15 | Discharge: 2017-01-15 | Disposition: A | Discharge status: Home / Self Care | Payer: Self-pay | Attending: Physician Assistant | Admitting: Physician Assistant

## 2017-01-15 DIAGNOSIS — R609 Edema, unspecified: Secondary | ICD-10-CM | POA: Insufficient documentation

## 2017-01-15 DIAGNOSIS — M79609 Pain in unspecified limb: Secondary | ICD-10-CM

## 2017-01-15 DIAGNOSIS — F17210 Nicotine dependence, cigarettes, uncomplicated: Secondary | ICD-10-CM | POA: Insufficient documentation

## 2017-01-15 DIAGNOSIS — I10 Essential (primary) hypertension: Secondary | ICD-10-CM | POA: Insufficient documentation

## 2017-01-15 DIAGNOSIS — F329 Major depressive disorder, single episode, unspecified: Secondary | ICD-10-CM | POA: Insufficient documentation

## 2017-01-15 NOTE — Discharge Instructions (Signed)
You have no evidence of blood clot on ultrasound. Please follow-up with your primary care physician for the swelling. Please return with any shortness of breath or other concerns.

## 2017-01-15 NOTE — ED Notes (Signed)
EDP at bedside  

## 2017-01-15 NOTE — ED Notes (Signed)
Patient Alert and oriented X4. Stable and ambulatory. Patient verbalized understanding of the discharge instructions.  Patient belongings were taken by the patient.  

## 2017-01-15 NOTE — Progress Notes (Signed)
*  Preliminary Results* Bilateral lower extremity venous duplex completed. Visualized veins of bilateral lower extremities are negative for deep vein thrombosis. There is no evidence of Baker's cyst bilaterally.  01/15/2017 2:43 PM Maudry Mayhew, BS, RVT, RDCS, RDMS

## 2017-01-15 NOTE — ED Triage Notes (Signed)
Pt states that he began having rt lewg swelling on Friday. Pt states that the next day he noticed left leg swelling.pt states that he has a throbbing pain in both. Pt noted to have left leg swelling greater than right. Denies any SOB/Chest pain. Pt reports polio hx and states that he normally has very thin legs.

## 2017-01-15 NOTE — ED Provider Notes (Signed)
[facility] DEPT Provider Note   CSN: [id number] Arrival date & time: 01/15/17  1231   By signing my name below, I, Eunice Blase, attest that this documentation has been prepared under the direction and in the presence of Aydin Hink, Fredia Sorrow, MD. Electronically signed, Eunice Blase, ED Scribe. 01/15/17. 4:27 PM.  History   Chief Complaint Chief Complaint  Patient presents with  . Leg Pain   The history is provided by the patient and medical records. No language interpreter was used.    VIAN FLUEGEL is a 41 y.o. male with h/o HTN and arthritis presenting to the Emergency Department concerning L > R painful leg swelling x 4 days. He descries 5/10, constant throbbing that is worse in the morning, with walking and application of pressure. He expresses concern for blood clot. Upcoming appointment with PCP noted on 01/30/2017. H/o polio noted. No h/o heart failure. No prolonged outdoor/ heat exposure noted. No chest pain, SOB or any other complaints noted at this time.   Past Medical History:  Diagnosis Date  . Allergy   . Arthritis   . Depression   . GERD (gastroesophageal reflux disease)   . Hypertension   . Polio   . Seizures ([place])    last seizure about 7 years ago.   . Substance abuse    Alcohol abuse    Patient Active Problem List   Diagnosis Date Noted  . Generalized anxiety disorder 02/01/2015  . Seasonal allergies 02/01/2015  . Rash and nonspecific skin eruption 02/01/2015  . Alcohol abuse 02/01/2015    Past Surgical History:  Procedure Laterality Date  . LEG SURGERY  1988   8 surgeries-hips and legs for walking after getting polio- last one 1988  . WRIST SURGERY     right       Home Medications    Prior to Admission medications   Medication Sig Start Date End Date Taking? Authorizing Provider  doxycycline (VIBRA-TABS) 100 MG tablet Take 1 tablet (100 mg total) by mouth 2 (two) times daily. 02/01/15   Golden Circle, FNP  fluticasone (FLONASE) 50  MCG/ACT nasal spray Place 2 sprays into both nostrils daily. 02/01/15   Golden Circle, FNP  levocetirizine (XYZAL) 5 MG tablet Take 1 tablet (5 mg total) by mouth every evening. 02/01/15   Golden Circle, FNP    Family History Family History  Problem Relation Age of Onset  . Cancer Mother        Breast  . Arthritis Mother   . Alcohol abuse Father   . Arthritis Father   . Hypertension Father   . Cancer Maternal Grandmother        Breast  . Hypertension Maternal Grandmother   . Hypertension Maternal Grandfather   . Hypertension Paternal Grandmother   . Hypertension Paternal Grandfather     Social History Social History  Substance Use Topics  . Smoking status: Current Every Day Smoker    Packs/day: 2.00    Years: 25.00    Types: Cigarettes  . Smokeless tobacco: Never Used  . Alcohol use 42.0 oz/week    70 Cans of beer per week     Allergies   Patient has no known allergies.   Review of Systems Review of Systems  Constitutional: Negative for fever.  Respiratory: Negative for shortness of breath.   Cardiovascular: Positive for leg swelling. Negative for chest pain.  Gastrointestinal: Negative for nausea and vomiting.  Musculoskeletal: Positive for myalgias.  Skin: Negative for color change  and wound.  Neurological: Negative for weakness and numbness.  All other systems reviewed and are negative.    Physical Exam Updated Vital Signs BP 132/89 (BP Location: Right Arm)   Pulse 64   Temp 98.2 F (36.8 C) (Oral)   Resp 14   Ht 5\' 2"  (1.575 m)   Wt 127 lb (57.6 kg)   SpO2 98%   BMI 23.23 kg/m   Physical Exam  Constitutional: He is oriented to person, place, and time. He appears well-developed and well-nourished.  HENT:  Head: Normocephalic.  Eyes: Pupils are equal, round, and reactive to light. Conjunctivae and EOM are normal.  Neck: Normal range of motion.  Cardiovascular: Normal rate, regular rhythm and normal heart sounds.  Exam reveals no gallop and  no friction rub.   No murmur heard. Pulmonary/Chest: Effort normal and breath sounds normal. No respiratory distress. He has no wheezes. He has no rales.  Abdominal: Soft. He exhibits no distension. There is no tenderness. There is no rebound and no guarding.  Genitourinary: Penis normal.  Musculoskeletal: Normal range of motion. He exhibits no tenderness.  R foot shows changes consistent with polio. 2+ pitting edema on the RLE. 1+ pitting edema on the LLE.  Neurological: He is alert and oriented to person, place, and time.  Skin: Skin is warm. No erythema.  Psychiatric: He has a normal mood and affect.  Nursing note and vitals reviewed.    ED Treatments / Results  DIAGNOSTIC STUDIES: Oxygen Saturation is 98% on RA, NL by my interpretation.    COORDINATION OF CARE: 4:26 PM-Discussed next steps with pt. Pt verbalized understanding and is agreeable with the plan. Pt prepared for d/c, advised of symptomatic care at home, F/U instructions and return precautions.    Labs (all labs ordered are listed, but only abnormal results are displayed) Labs Reviewed - No data to display  EKG  EKG Interpretation None       Radiology No results found.  Procedures Procedures (including critical care time)  Medications Ordered in ED Medications - No data to display   Initial Impression / Assessment and Plan / ED Course  I have reviewed the triage vital signs and the nursing notes.  Pertinent labs & imaging results that were available during my care of the patient were reviewed by me and considered in my medical decision making (see chart for details).     I personally performed the services described in this documentation, which was scribed in my presence. The recorded information has been reviewed and is accurate.   Patient here with concern over DVT. DVT studies negative. We'll have him follow-up with primary care physician. Patient appears very well, no shortness of breath. Normal  vital signs and reassuring physical exam.  Final Clinical Impressions(s) / ED Diagnoses   Final diagnoses:  None    New Prescriptions New Prescriptions   No medications on file       Macarthur Critchley, MD 02/07/17 1039

## 2017-06-28 ENCOUNTER — Institutional Professional Consult (permissible substitution): Payer: Self-pay | Admitting: Family Medicine

## 2017-07-04 ENCOUNTER — Ambulatory Visit (INDEPENDENT_AMBULATORY_CARE_PROVIDER_SITE_OTHER): Payer: Medicare Other | Admitting: Family Medicine

## 2017-07-04 ENCOUNTER — Encounter: Payer: Self-pay | Admitting: Family Medicine

## 2017-07-04 VITALS — BP 140/84 | HR 110 | Ht 62.0 in | Wt 130.8 lb

## 2017-07-04 DIAGNOSIS — R030 Elevated blood-pressure reading, without diagnosis of hypertension: Secondary | ICD-10-CM | POA: Diagnosis not present

## 2017-07-04 DIAGNOSIS — F17210 Nicotine dependence, cigarettes, uncomplicated: Secondary | ICD-10-CM | POA: Insufficient documentation

## 2017-07-04 DIAGNOSIS — G4700 Insomnia, unspecified: Secondary | ICD-10-CM | POA: Insufficient documentation

## 2017-07-04 DIAGNOSIS — F1010 Alcohol abuse, uncomplicated: Secondary | ICD-10-CM

## 2017-07-04 DIAGNOSIS — G14 Postpolio syndrome: Secondary | ICD-10-CM | POA: Insufficient documentation

## 2017-07-04 DIAGNOSIS — F411 Generalized anxiety disorder: Secondary | ICD-10-CM | POA: Diagnosis not present

## 2017-07-04 DIAGNOSIS — G8929 Other chronic pain: Secondary | ICD-10-CM

## 2017-07-04 DIAGNOSIS — M25551 Pain in right hip: Secondary | ICD-10-CM

## 2017-07-04 NOTE — Patient Instructions (Addendum)
You can call to schedule your appointment with the psychiatrist and counselor. A few offices are listed below for you to call.   [facility]  [place], [place], [place] [address]  Phone: [phone]  [place] [address] [place], [place] [address]  Phone: [phone]   We will call you with your lab results.    You should receive a phone call to schedule with a pain management specialist.   Your blood pressure is elevated today 142/84.  Normal blood pressure is less than 130/80.  I recommend that you cut back on sodium in your diet.  Stopping smoking can help this.  Follow-up with me in 4 weeks for repeat blood pressure check.   DASH Eating Plan DASH stands for "Dietary Approaches to Stop Hypertension." The DASH eating plan is a healthy eating plan that has been shown to reduce high blood pressure (hypertension). It may also reduce your risk for type 2 diabetes, heart disease, and stroke. The DASH eating plan may also help with weight loss. What are tips for following this plan? General guidelines  Avoid eating more than 2,300 mg (milligrams) of salt (sodium) a day. If you have hypertension, you may need to reduce your sodium intake to 1,500 mg a day.  Limit alcohol intake to no more than 1 drink a day for nonpregnant women and 2 drinks a day for men. One drink equals 12 oz of beer, 5 oz of wine, or 1 oz of hard liquor.  Work with your health care provider to maintain a healthy body weight or to lose weight. Ask what an ideal weight is for you.  Get at least 30 minutes of exercise that causes your heart to beat faster (aerobic exercise) most days of the week. Activities may include walking, swimming, or biking.  Work with your health care provider or diet and nutrition specialist (dietitian) to adjust your eating plan to your individual calorie needs. Reading food labels  Check food labels for  the amount of sodium per serving. Choose foods with less than 5 percent of the Daily Value of sodium. Generally, foods with less than 300 mg of sodium per serving fit into this eating plan.  To find whole grains, look for the word "whole" as the first word in the ingredient list. Shopping  Buy products labeled as "low-sodium" or "no salt added."  Buy fresh foods. Avoid canned foods and premade or frozen meals. Cooking  Avoid adding salt when cooking. Use salt-free seasonings or herbs instead of table salt or sea salt. Check with your health care provider or pharmacist before using salt substitutes.  Do not fry foods. Cook foods using healthy methods such as baking, boiling, grilling, and broiling instead.  Cook with heart-healthy oils, such as olive, canola, soybean, or sunflower oil. Meal planning   Eat a balanced diet that includes: ? 5 or more servings of fruits and vegetables each day. At each meal, try to fill half of your plate with fruits and vegetables. ? Up to 6-8 servings of whole grains each day. ? Less than 6 oz of lean meat, poultry, or fish each day. A 3-oz serving of meat is about the same size as a deck of cards. One egg equals 1 oz. ? 2 servings of low-fat dairy each day. ? A serving of nuts, seeds, or beans 5 times each week. ? Heart-healthy fats. Healthy fats called Omega-3 fatty acids are  found in foods such as flaxseeds and coldwater fish, like sardines, salmon, and mackerel.  Limit how much you eat of the following: ? Canned or prepackaged foods. ? Food that is high in trans fat, such as fried foods. ? Food that is high in saturated fat, such as fatty meat. ? Sweets, desserts, sugary drinks, and other foods with added sugar. ? Full-fat dairy products.  Do not salt foods before eating.  Try to eat at least 2 vegetarian meals each week.  Eat more home-cooked food and less restaurant, buffet, and fast food.  When eating at a restaurant, ask that your food be  prepared with less salt or no salt, if possible. What foods are recommended? The items listed may not be a complete list. Talk with your dietitian about what dietary choices are best for you. Grains Whole-grain or whole-wheat bread. Whole-grain or whole-wheat pasta. Brown rice. [place] Morrow. Bulgur. Whole-grain and low-sodium cereals. Pita bread. Low-fat, low-sodium crackers. Whole-wheat flour tortillas. Vegetables Fresh or frozen vegetables (raw, steamed, roasted, or grilled). Low-sodium or reduced-sodium tomato and vegetable juice. Low-sodium or reduced-sodium tomato sauce and tomato paste. Low-sodium or reduced-sodium canned vegetables. Fruits All fresh, dried, or frozen fruit. Canned fruit in natural juice (without added sugar). Meat and other protein foods Skinless chicken or [place]. Ground chicken or [place]. Pork with fat trimmed off. Fish and seafood. Egg whites. Dried beans, peas, or lentils. Unsalted nuts, nut butters, and seeds. Unsalted canned beans. Lean cuts of beef with fat trimmed off. Low-sodium, lean deli meat. Dairy Low-fat (1%) or fat-free (skim) milk. Fat-free, low-fat, or reduced-fat cheeses. Nonfat, low-sodium ricotta or cottage cheese. Low-fat or nonfat yogurt. Low-fat, low-sodium cheese. Fats and oils Soft margarine without trans fats. Vegetable oil. Low-fat, reduced-fat, or light mayonnaise and salad dressings (reduced-sodium). Canola, safflower, olive, soybean, and sunflower oils. Avocado. Seasoning and other foods Herbs. Spices. Seasoning mixes without salt. Unsalted popcorn and pretzels. Fat-free sweets. What foods are not recommended? The items listed may not be a complete list. Talk with your dietitian about what dietary choices are best for you. Grains Baked goods made with fat, such as croissants, muffins, or some breads. Dry pasta or rice meal packs. Vegetables Creamed or fried vegetables. Vegetables in a cheese sauce. Regular canned vegetables (not  low-sodium or reduced-sodium). Regular canned tomato sauce and paste (not low-sodium or reduced-sodium). Regular tomato and vegetable juice (not low-sodium or reduced-sodium). Angie Fava. Olives. Fruits Canned fruit in a light or heavy syrup. Fried fruit. Fruit in cream or butter sauce. Meat and other protein foods Fatty cuts of meat. Ribs. Fried meat. Berniece Salines. Sausage. Bologna and other processed lunch meats. Salami. Fatback. Hotdogs. Bratwurst. Salted nuts and seeds. Canned beans with added salt. Canned or smoked fish. Whole eggs or egg yolks. Chicken or [place] with skin. Dairy Whole or 2% milk, cream, and half-and-half. Whole or full-fat cream cheese. Whole-fat or sweetened yogurt. Full-fat cheese. Nondairy creamers. Whipped toppings. Processed cheese and cheese spreads. Fats and oils Butter. Stick margarine. Lard. Shortening. Ghee. Bacon fat. Tropical oils, such as coconut, palm kernel, or palm oil. Seasoning and other foods Salted popcorn and pretzels. Onion salt, garlic salt, seasoned salt, table salt, and sea salt. Worcestershire sauce. Tartar sauce. Barbecue sauce. Teriyaki sauce. Soy sauce, including reduced-sodium. Steak sauce. Canned and packaged gravies. Fish sauce. Oyster sauce. Cocktail sauce. Horseradish that you find on the shelf. Ketchup. Mustard. Meat flavorings and tenderizers. Bouillon cubes. Hot sauce and Tabasco sauce. Premade or packaged marinades. Premade or packaged taco seasonings.  Relishes. Regular salad dressings. Where to find more information:  National Heart, Lung, and [place]: https://wilson-eaton.com/  American Heart Association: www.heart.org Summary  The DASH eating plan is a healthy eating plan that has been shown to reduce high blood pressure (hypertension). It may also reduce your risk for type 2 diabetes, heart disease, and stroke.  With the DASH eating plan, you should limit salt (sodium) intake to 2,300 mg a day. If you have hypertension, you may need to reduce  your sodium intake to 1,500 mg a day.  When on the DASH eating plan, aim to eat more fresh fruits and vegetables, whole grains, lean proteins, low-fat dairy, and heart-healthy fats.  Work with your health care provider or diet and nutrition specialist (dietitian) to adjust your eating plan to your individual calorie needs. This information is not intended to replace advice given to you by your health care provider. Make sure you discuss any questions you have with your health care provider. Document Released: 06/01/2011 Document Revised: 06/05/2016 Document Reviewed: 06/05/2016 Elsevier Interactive Patient Education  [employer].
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
## 2017-07-06 ENCOUNTER — Telehealth: Payer: Self-pay | Admitting: Family Medicine

## 2017-07-06 ENCOUNTER — Telehealth: Payer: Self-pay

## 2017-07-06 ENCOUNTER — Other Ambulatory Visit: Payer: Self-pay

## 2017-07-06 DIAGNOSIS — R748 Abnormal levels of other serum enzymes: Secondary | ICD-10-CM

## 2017-07-06 DIAGNOSIS — G14 Postpolio syndrome: Secondary | ICD-10-CM

## 2017-07-06 LAB — COMPREHENSIVE METABOLIC PANEL
AG Ratio: 1.5 (calc) (ref 1.0–2.5)
ALT: 62 U/L — AB (ref 9–46)
AST: 80 U/L — AB (ref 10–40)
Albumin: 4.5 g/dL (ref 3.6–5.1)
Alkaline phosphatase (APISO): 81 U/L (ref 40–115)
BUN: 11 mg/dL (ref 7–25)
CO2: 20 mmol/L (ref 20–32)
Calcium: 9 mg/dL (ref 8.6–10.3)
Chloride: 94 mmol/L — ABNORMAL LOW (ref 98–110)
Creat: 0.73 mg/dL (ref 0.60–1.35)
Globulin: 3 g/dL (calc) (ref 1.9–3.7)
Glucose, Bld: 60 mg/dL — ABNORMAL LOW (ref 65–99)
Potassium: 4.8 mmol/L (ref 3.5–5.3)
Sodium: 133 mmol/L — ABNORMAL LOW (ref 135–146)
TOTAL PROTEIN: 7.5 g/dL (ref 6.1–8.1)
Total Bilirubin: 0.5 mg/dL (ref 0.2–1.2)

## 2017-07-06 LAB — CBC WITH DIFFERENTIAL/PLATELET
Basophils Absolute: 51 cells/uL (ref 0–200)
Basophils Relative: 0.9 %
EOS ABS: 11 {cells}/uL — AB (ref 15–500)
EOS PCT: 0.2 %
HCT: 44.4 % (ref 38.5–50.0)
Hemoglobin: 15.6 g/dL (ref 13.2–17.1)
Lymphs Abs: 1191 cells/uL (ref 850–3900)
MCH: 33.5 pg — AB (ref 27.0–33.0)
MCHC: 35.1 g/dL (ref 32.0–36.0)
MCV: 95.5 fL (ref 80.0–100.0)
MPV: 10.4 fL (ref 7.5–12.5)
Monocytes Relative: 10.1 %
Neutro Abs: 3870 cells/uL (ref 1500–7800)
Neutrophils Relative %: 67.9 %
PLATELETS: 135 10*3/uL — AB (ref 140–400)
RBC: 4.65 10*6/uL (ref 4.20–5.80)
RDW: 12.6 % (ref 11.0–15.0)
TOTAL LYMPHOCYTE: 20.9 %
WBC mixed population: 576 cells/uL (ref 200–950)
WBC: 5.7 10*3/uL (ref 3.8–10.8)

## 2017-07-06 LAB — TEST AUTHORIZATION

## 2017-07-06 LAB — REFLEX TIQ

## 2017-07-06 LAB — TSH: TSH: 0.63 mIU/L (ref 0.40–4.50)

## 2017-07-06 LAB — ACUTE HEP PANEL AND HEP B SURFACE AB
HEP A IGM: NONREACTIVE
HEP B C IGM: NONREACTIVE
HEPATITIS C ANTIBODY REFILL$(REFL): NONREACTIVE
Hepatitis B Surface Ag: NONREACTIVE
SIGNAL TO CUT-OFF: 0.02 (ref <1.00)

## 2017-07-06 NOTE — Telephone Encounter (Signed)
Records received from [facility]. Sending back for review

## 2017-07-06 NOTE — Telephone Encounter (Signed)
I called 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 He would have to be sent to an actual pain clinic.    Wanted you to be aware.   Thanks!

## 2017-07-06 NOTE — Telephone Encounter (Signed)
Thanks. Let's refer him to pain management then.

## 2017-07-06 NOTE — Telephone Encounter (Signed)
Submitted epic referral to pain clinic for polio syndrome.

## 2017-07-30 ENCOUNTER — Other Ambulatory Visit: Payer: Medicare Other

## 2017-08-01 ENCOUNTER — Ambulatory Visit: Payer: Medicare Other | Admitting: Family Medicine

## 2017-08-02 ENCOUNTER — Ambulatory Visit: Payer: Medicare Other | Attending: Nurse Practitioner | Admitting: Nurse Practitioner

## 2017-10-22 ENCOUNTER — Ambulatory Visit (HOSPITAL_COMMUNITY)
Admission: EM | Admit: 2017-10-22 | Discharge: 2017-10-22 | Disposition: A | Discharge status: Home / Self Care | Payer: Medicare Other | Attending: Urgent Care | Admitting: Urgent Care

## 2017-10-22 ENCOUNTER — Encounter (HOSPITAL_COMMUNITY): Payer: Self-pay | Admitting: Emergency Medicine

## 2017-10-22 ENCOUNTER — Ambulatory Visit (INDEPENDENT_AMBULATORY_CARE_PROVIDER_SITE_OTHER): Payer: Medicare Other

## 2017-10-22 DIAGNOSIS — I10 Essential (primary) hypertension: Secondary | ICD-10-CM

## 2017-10-22 DIAGNOSIS — S40012A Contusion of left shoulder, initial encounter: Secondary | ICD-10-CM

## 2017-10-22 DIAGNOSIS — R079 Chest pain, unspecified: Secondary | ICD-10-CM | POA: Diagnosis not present

## 2017-10-22 DIAGNOSIS — Z720 Tobacco use: Secondary | ICD-10-CM | POA: Diagnosis not present

## 2017-10-22 DIAGNOSIS — R05 Cough: Secondary | ICD-10-CM

## 2017-10-22 DIAGNOSIS — J9801 Acute bronchospasm: Secondary | ICD-10-CM

## 2017-10-22 DIAGNOSIS — M25512 Pain in left shoulder: Secondary | ICD-10-CM | POA: Diagnosis not present

## 2017-10-22 DIAGNOSIS — F17200 Nicotine dependence, unspecified, uncomplicated: Secondary | ICD-10-CM | POA: Diagnosis not present

## 2017-10-22 DIAGNOSIS — F1010 Alcohol abuse, uncomplicated: Secondary | ICD-10-CM

## 2017-10-22 DIAGNOSIS — R059 Cough, unspecified: Secondary | ICD-10-CM

## 2017-10-22 MED ORDER — LEVOCETIRIZINE DIHYDROCHLORIDE 5 MG PO TABS: 5.0000 mg | ORAL_TABLET | Freq: Every evening | ORAL | 1 refills | Status: DC

## 2017-10-22 MED ORDER — AMLODIPINE BESYLATE 5 MG PO TABS: 5.0000 mg | ORAL_TABLET | Freq: Every day | ORAL | 0 refills | Status: DC

## 2017-10-22 MED ORDER — METHYLPREDNISOLONE ACETATE 80 MG/ML IJ SUSP
80.0000 mg | Freq: Once | INTRAMUSCULAR | Status: AC
  Administered 2017-10-22: 80 mg via INTRAMUSCULAR

## 2017-10-22 MED ORDER — ALBUTEROL SULFATE HFA 108 (90 BASE) MCG/ACT IN AERS: 1.0000 | INHALATION_SPRAY | Freq: Four times a day (QID) | RESPIRATORY_TRACT | 0 refills | Status: DC | PRN

## 2017-10-22 MED ORDER — METHYLPREDNISOLONE ACETATE 80 MG/ML IJ SUSP
INTRAMUSCULAR | Status: AC
  Filled 2017-10-22: qty 1

## 2017-10-22 MED ORDER — BENZONATATE 100 MG PO CAPS: 100.0000 mg | ORAL_CAPSULE | Freq: Three times a day (TID) | ORAL | 0 refills | Status: DC | PRN

## 2017-10-22 NOTE — ED Triage Notes (Signed)
Pt sts URI sx and Left shoulder pain since fall 2 weeks ago; pt with hx of polio

## 2017-10-22 NOTE — ED Provider Notes (Signed)
  MRN: [id number] DOB: [birth year]  Subjective:   Keith Parker is a 42 y.o. male presenting for 4-day history of persistent productive cough, coughing fits.  Symptoms have been worsening and now his cough is causing him neck pain back pain and chest pain.  He has tried over-the-counter cough drops with minimal relief. Smokes 2ppd.  He has a history of allergies but is not taking his allergy medications.  He also has a history of high blood pressure and is not taking his medicines there either.   No Known Allergies   Past Medical History:  Diagnosis Date  . Allergy   . Arthritis   . Depression   . GERD (gastroesophageal reflux disease)   . Hypertension   . Polio   . Seizures ([place])    last seizure about 7 years ago.   . Substance abuse ([place])    Alcohol abuse     Past Surgical History:  Procedure Laterality Date  . LEG SURGERY  1988   8 surgeries-hips and legs for walking after getting polio- last one 1988  . WRIST SURGERY     right    Objective:   Vitals: BP (!) 165/121 (BP Location: Right Arm)   Pulse 95   Temp 97.9 F (36.6 C) (Oral)   Resp 18   SpO2 97%   Physical Exam  Constitutional: He is oriented to person, place, and time. He appears well-developed and well-nourished.  HENT:  Mouth/Throat: Oropharynx is clear and moist.  Throat is dry and erythematous.  Eyes: Right eye exhibits no discharge. Left eye exhibits no discharge. No scleral icterus.  Neck: Normal range of motion. Neck supple.  Cardiovascular: Normal rate, regular rhythm and intact distal pulses. Exam reveals no gallop and no friction rub.  No murmur heard. Pulmonary/Chest: No stridor. No respiratory distress. He has no wheezes. He has no rales.  Diminished lung sounds throughout.  Musculoskeletal: He exhibits no edema.  Lymphadenopathy:    He has no cervical adenopathy.  Neurological: He is alert and oriented to person, place, and time.  Skin: Skin is warm and dry.  Psychiatric: He has a normal  mood and affect.   Dg Chest 2 View  Result Date: 10/22/2017 CLINICAL DATA:  Productive cough.  Atypical chest pain. EXAM: CHEST - 2 VIEW COMPARISON:  Chest x-ray dated 11/06/2008 and CT scan of the chest dated 02/27/2014 FINDINGS: The heart size and mediastinal contours are within normal limits. Both lungs are clear. The visualized skeletal structures are unremarkable. IMPRESSION: No active cardiopulmonary disease. Electronically Signed   By: Lorriane Shire M.D.   On: 10/22/2017 11:43     Assessment and Plan :   Cough  Bronchospasm  Tobacco use disorder  Essential hypertension  Contusion of left shoulder, initial encounter  Acute pain of left shoulder  Alcohol abuse  Chest x-ray reassuring, will use IM Depo-Medrol in clinic today.  Use supportive care otherwise including albuterol inhaler and Tessalon capsules.  Counseled patient on hazards of smoking heavily and drinking heavily.  He has uncontrolled hypertension and is largely noncompliant.  States that he has had multiple PCPs in the past that he just did not get along with and has not as tried to establish care anymore.  Return to clinic precautions discussed.     Jaynee Eagles, PA-C 10/22/17 1150

## 2017-10-22 NOTE — Discharge Instructions (Addendum)
For a consult of physical therapy, please contact [facility].  °[phone] °Please call Jennifer Auman, Office Manager to set up an appointment. °

## 2017-10-29 ENCOUNTER — Encounter: Payer: Self-pay | Admitting: Family Medicine

## 2017-10-29 DIAGNOSIS — R748 Abnormal levels of other serum enzymes: Secondary | ICD-10-CM | POA: Insufficient documentation

## 2017-10-29 HISTORY — DX: Abnormal levels of other serum enzymes: R74.8

## 2017-12-31 ENCOUNTER — Encounter: Payer: Self-pay | Admitting: Family Medicine

## 2017-12-31 DIAGNOSIS — H900 Conductive hearing loss, bilateral: Secondary | ICD-10-CM

## 2017-12-31 HISTORY — DX: Conductive hearing loss, bilateral: H90.0

## 2018-01-03 NOTE — Progress Notes (Deleted)
Keith Parker is a 42 y.o. male who presents for annual wellness visit and follow-up on chronic medical conditions.  He has the following concerns:    There is no immunization history on file for this patient. Last colonoscopy: Last PSA: Dentist: Ophtho: Exercise:  Other doctors caring for patient include:   Depression screen:  See questionnaire below.    No flowsheet data found.  Fall Screen: See Questionaire below.  No flowsheet data found.  ADL screen:  See questionnaire below.  Functional Status Survey:     End of Life Discussion:  Patient {ACTIONS; HAS/DOES NOT HAVE:19233} a living will and medical power of attorney   Review of Systems  Constitutional: -fever, -chills, -sweats, -unexpected weight change, -anorexia, -fatigue Allergy: -sneezing, -itching, -congestion Dermatology: denies changing moles, rash, lumps, new worrisome lesions ENT: -runny nose, -ear pain, -sore throat, -hoarseness, -sinus pain, -teeth pain, -tinnitus, -hearing loss, -epistaxis Cardiology:  -chest pain, -palpitations, -edema, -orthopnea, -paroxysmal nocturnal dyspnea Respiratory: -cough, -shortness of breath, -dyspnea on exertion, -wheezing, -hemoptysis Gastroenterology: -abdominal pain, -nausea, -vomiting, -diarrhea, -constipation, -blood in stool, -changes in bowel movement, -dysphagia Hematology: -bleeding or bruising problems Musculoskeletal: -arthralgias, -myalgias, -joint swelling, -back pain, -neck pain, -cramping, -gait changes Ophthalmology: -vision changes, -eye redness, -itching, -discharge Urology: -dysuria, -difficulty urinating, -hematuria, -urinary frequency, -urgency, incontinence Neurology: -headache, -weakness, -tingling, -numbness, -speech abnormality, -memory loss, -falls, -dizziness Psychology:  -depressed mood, -agitation, -sleep problems   PHYSICAL EXAM:  There were no vitals taken for this visit.  General Appearance: Alert, cooperative, no distress, appears stated  age Head: Normocephalic, without obvious abnormality, atraumatic Eyes: PERRL, conjunctiva/corneas clear, EOM's intact, fundi benign Ears: Normal TM's and external ear canals Nose: Nares normal, mucosa normal, no drainage or sinus   tenderness Throat: Lips, mucosa, and tongue normal; teeth and gums normal Neck: Supple, no lymphadenopathy, thyroid:no enlargement/tenderness/nodules; no carotid bruit or JVD Back: Spine nontender, no curvature, ROM normal, no CVA tenderness Lungs: Clear to auscultation bilaterally without wheezes, rales or ronchi; respirations unlabored Chest Wall: No tenderness or deformity Heart: Regular rate and rhythm, S1 and S2 normal, no murmur, rub or gallop Breast Exam: No chest wall tenderness, masses or gynecomastia Abdomen: Soft, non-tender, nondistended, normoactive bowel sounds, no masses, no hepatosplenomegaly Genitalia: Normal male external genitalia without lesions.  Testicles without masses.  No inguinal hernias. Rectal: Normal sphincter tone, no masses or tenderness; guaiac negative stool.  Prostate smooth, no nodules, not enlarged. Extremities: No clubbing, cyanosis or edema Pulses: 2+ and symmetric all extremities Skin: Skin color, texture, turgor normal, no rashes or lesions Lymph nodes: Cervical, supraclavicular, and axillary nodes normal Neurologic: CNII-XII intact, normal strength, sensation and gait; reflexes 2+ and symmetric throughout   Psych: Normal mood, affect, hygiene and grooming  ASSESSMENT/PLAN: Medicare annual wellness visit, subsequent  Heavy cigarette smoker  Chronic right hip pain  Elevated liver enzymes  Generalized anxiety disorder     Discussed PSA screening (risks/benefits), recommended at least 30 minutes of aerobic activity at least 5 days/week; proper sunscreen use reviewed; healthy diet and alcohol recommendations (less than or equal to 2 drinks/day) reviewed; regular seatbelt use; changing batteries in smoke detectors.  Immunization recommendations discussed.  Colonoscopy recommendations reviewed.   Medicare Attestation I have personally reviewed: The patient's medical and social history Their use of alcohol, tobacco or illicit drugs Their current medications and supplements The patient's functional ability including ADLs,fall risks, home safety risks, cognitive, and hearing and visual impairment Diet and physical activities Evidence for depression or mood disorders  The patient's  weight, height, and BMI have been recorded in the chart.  I have made referrals, counseling, and provided education to the patient based on review of the above and I have provided the patient with a written personalized care plan for preventive services.     Harland Dingwall, NP-C   01/03/2018

## 2018-01-04 ENCOUNTER — Ambulatory Visit: Payer: Self-pay | Admitting: Family Medicine

## 2018-01-14 ENCOUNTER — Encounter: Payer: Self-pay | Admitting: Family Medicine

## 2018-02-08 ENCOUNTER — Ambulatory Visit (INDEPENDENT_AMBULATORY_CARE_PROVIDER_SITE_OTHER): Payer: Medicare Other | Admitting: Family Medicine

## 2018-02-08 ENCOUNTER — Encounter: Payer: Self-pay | Admitting: Family Medicine

## 2018-02-08 VITALS — BP 120/70 | HR 83 | Temp 98.1°F | Wt 116.8 lb

## 2018-02-08 DIAGNOSIS — R82998 Other abnormal findings in urine: Secondary | ICD-10-CM | POA: Diagnosis not present

## 2018-02-08 DIAGNOSIS — R748 Abnormal levels of other serum enzymes: Secondary | ICD-10-CM | POA: Diagnosis not present

## 2018-02-08 DIAGNOSIS — R5383 Other fatigue: Secondary | ICD-10-CM | POA: Diagnosis not present

## 2018-02-08 DIAGNOSIS — R61 Generalized hyperhidrosis: Secondary | ICD-10-CM | POA: Diagnosis not present

## 2018-02-08 DIAGNOSIS — F329 Major depressive disorder, single episode, unspecified: Secondary | ICD-10-CM

## 2018-02-08 DIAGNOSIS — G8929 Other chronic pain: Secondary | ICD-10-CM

## 2018-02-08 DIAGNOSIS — F32A Depression, unspecified: Secondary | ICD-10-CM

## 2018-02-08 DIAGNOSIS — M67431 Ganglion, right wrist: Secondary | ICD-10-CM

## 2018-02-08 DIAGNOSIS — G14 Postpolio syndrome: Secondary | ICD-10-CM | POA: Diagnosis not present

## 2018-02-08 DIAGNOSIS — Z86010 Personal history of colonic polyps: Secondary | ICD-10-CM

## 2018-02-08 DIAGNOSIS — M25551 Pain in right hip: Secondary | ICD-10-CM

## 2018-02-08 DIAGNOSIS — F1010 Alcohol abuse, uncomplicated: Secondary | ICD-10-CM

## 2018-02-08 DIAGNOSIS — R29898 Other symptoms and signs involving the musculoskeletal system: Secondary | ICD-10-CM | POA: Diagnosis not present

## 2018-02-08 DIAGNOSIS — F411 Generalized anxiety disorder: Secondary | ICD-10-CM

## 2018-02-08 DIAGNOSIS — R21 Rash and other nonspecific skin eruption: Secondary | ICD-10-CM

## 2018-02-08 DIAGNOSIS — R634 Abnormal weight loss: Secondary | ICD-10-CM

## 2018-02-08 DIAGNOSIS — K921 Melena: Secondary | ICD-10-CM

## 2018-02-08 DIAGNOSIS — Z748 Other problems related to care provider dependency: Secondary | ICD-10-CM

## 2018-02-08 LAB — POCT URINALYSIS DIP (PROADVANTAGE DEVICE)
Bilirubin, UA: NEGATIVE
Blood, UA: NEGATIVE
Glucose, UA: NEGATIVE mg/dL
Ketones, POC UA: NEGATIVE mg/dL
Leukocytes, UA: NEGATIVE
Nitrite, UA: NEGATIVE
Protein Ur, POC: NEGATIVE mg/dL
Specific Gravity, Urine: 1.025
UUROB: NEGATIVE
pH, UA: 6 (ref 5.0–8.0)

## 2018-02-08 NOTE — Patient Instructions (Addendum)
You can call to schedule your appointment with the Counselor/Psychiatrist for anxiety, depression, sleep issues.    [address], [place], [place] [address]  Phone: [phone]  You will receive a phone call from [facility], the dermatologist and the gastroenterologist office.  You will also receive a call from [place] orthopedist for hip pain and leg pain/weakness.   Cut back on alcohol and smoking. Ideally you would work on stopping both.   We will call you with your lab results.

## 2018-02-08 NOTE — Progress Notes (Signed)
Subjective:    Patient ID: Keith Parker, male    DOB: 1975-07-31, 42 y.o.   MRN: [id number]  HPI Chief Complaint  Patient presents with  . 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 This is his second visit. Established care in 06/2017.  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  States typically his left leg is his stronger leg and it has started giving out on him.  States he has tried multiple pain medications in the past for hip pain and has been told he needs to be in a wheelchair but he refuses.   States he has not seen anyone such as ortho or pain management in approximately 20 years.   Denies fever, chills, chest pain, palpitations, abdominal pain, N/V/D.  No numbness, tingling or weakness.   I referred him to a pain management specialist at his last visit but he states he did not have transportation.   States he is losing weight and has a decreased appetite. 2 week history of night sweats.  Unhealthy diet. No exercise.  Reports having an episode of BRBPR a few weeks ago. History of colonoscopy with polyp removed.  History of elevated liver enzymes.   States his urine has been dark recently. No dysuria or frequency.  Lives with his father. Single. No sexual activity.  Drinking 2 -40 ounce beers daily. Denies drug use. Still smoking 2 packs per day and not ready to stop.     Complains of a papule on his right upper eye lid that seems to be growing. States he is embarrassed by this and would like to have it removed. No eye pain, vision changes or sign of infection.   Complaints of a cyst on his right anterior wrist for several weeks. No pain. Not bothersome.   Reports having anxiety and depression about his health. He also has issues with insomnia. Is agreeable to see a psychiatrist.  Denies SI or HI.   Does not live  near public transportation.   Review of Systems Pertinent positives and negatives in the history of present illness.     Objective:   Physical Exam BP 120/70   Pulse 83   Temp 98.1 F (36.7 C) (Oral)   Wt 116 lb 12.8 oz (53 kg)   BMI 21.36 kg/m   Alert and in no distress. Pharyngeal area is normal. Neck is supple without adenopathy or thyromegaly. Cardiac exam shows a regular sinus rhythm without murmurs or gallops. Lungs are clear to auscultation. Declines to get on the table for exam so abdominal, GU and rectal exam not done. Extremities without edema, muscle atrophy noted to bilateral LE. Pulses intact. Antalgic gait. Skin is warm and dry, no pallor. Right radial anterior wrist with ganglion cyst, illumination present. Right upper eye lid with a papule, no erythema, fluctuance or drainage.       Assessment & Plan:  Chronic right hip pain - Plan: Ambulatory referral to Orthopedic Surgery  Left leg weakness - Plan: Ambulatory referral to Orthopedic Surgery  Post-polio syndrome  Alcohol abuse - Plan: Lipid panel  Generalized anxiety disorder  Elevated liver enzymes - Plan: CBC with Differential/Platelet, Comprehensive metabolic panel, Lipid panel, Ambulatory referral to Gastroenterology  Depression, unspecified depression type  Ganglion cyst of wrist, right  Skin eruption -  Plan: Ambulatory referral to Dermatology  Night sweats - Plan: CBC with Differential/Platelet, Comprehensive metabolic panel, TSH, T4, free, Hemoglobin A1c  Weakness of left leg  Unintentional weight loss - Plan: CBC with Differential/Platelet, Comprehensive metabolic panel, TSH, T4, free, Lipid panel, Hemoglobin A1c, Ambulatory referral to Gastroenterology  Blood in stool - Plan: Ambulatory referral to Gastroenterology  History of colon polyps - Plan: Ambulatory referral to Gastroenterology  Fatigue, unspecified type - Plan: CBC with Differential/Platelet, Comprehensive metabolic panel, TSH, T4,  free, Lipid panel, Hemoglobin A1c  Dark urine - Plan: POCT Urinalysis DIP (Proadvantage Device)  Assistance needed with transportation - Plan: AMB Referral to [place] [facility]  UA negative.  Chronic pain -worsening hip, left leg pain and weakness. He did not go to pain management per my referral due to transportation issues.  Will refer him to ortho for further evaluation. Declines XR or PT at this point. Would like to be evaluated by a specialist.  I am putting in a referral to [facility] to assist him with transportation to appointments.  Anxiety and depression and insomnia- refer to [facility]  GI referral for bleeding per patient, refused rectal exam. History of colonic polyps per patient. Colonoscopy done in [place] per patient. He needs to get established in [place] due to transportation issues. He also reports unexplained weight loss.  Elevated liver enzymes. Daily alcohol use and states he has cut back.  Eyelid skin papule- referral to dermatologist per patient request due to increase in size. Ganglion cyst- he will hold off on hand specialist for now and let me know if this gets larger Check labs and follow up.

## 2018-02-09 LAB — CBC WITH DIFFERENTIAL/PLATELET
Basophils Absolute: 0 10*3/uL (ref 0.0–0.2)
Basos: 0 %
EOS (ABSOLUTE): 0.1 10*3/uL (ref 0.0–0.4)
Eos: 2 %
HEMOGLOBIN: 14.8 g/dL (ref 13.0–17.7)
Hematocrit: 42.7 % (ref 37.5–51.0)
Immature Grans (Abs): 0 10*3/uL (ref 0.0–0.1)
Immature Granulocytes: 0 %
LYMPHS ABS: 1.5 10*3/uL (ref 0.7–3.1)
Lymphs: 25 %
MCH: 33 pg (ref 26.6–33.0)
MCHC: 34.7 g/dL (ref 31.5–35.7)
MCV: 95 fL (ref 79–97)
MONOCYTES: 13 %
Monocytes Absolute: 0.7 10*3/uL (ref 0.1–0.9)
Neutrophils Absolute: 3.5 10*3/uL (ref 1.4–7.0)
Neutrophils: 60 %
Platelets: 107 10*3/uL — ABNORMAL LOW (ref 150–450)
RBC: 4.49 x10E6/uL (ref 4.14–5.80)
RDW: 13.4 % (ref 12.3–15.4)
WBC: 5.8 10*3/uL (ref 3.4–10.8)

## 2018-02-09 LAB — COMPREHENSIVE METABOLIC PANEL
ALBUMIN: 4.2 g/dL (ref 3.5–5.5)
ALT: 71 IU/L — ABNORMAL HIGH (ref 0–44)
AST: 101 IU/L — ABNORMAL HIGH (ref 0–40)
Albumin/Globulin Ratio: 1.9 (ref 1.2–2.2)
Alkaline Phosphatase: 95 IU/L (ref 39–117)
BUN/Creatinine Ratio: 6 — ABNORMAL LOW (ref 9–20)
BUN: 4 mg/dL — AB (ref 6–24)
Bilirubin Total: 0.3 mg/dL (ref 0.0–1.2)
CALCIUM: 9.1 mg/dL (ref 8.7–10.2)
CO2: 21 mmol/L (ref 20–29)
CREATININE: 0.64 mg/dL — AB (ref 0.76–1.27)
Chloride: 97 mmol/L (ref 96–106)
GFR calc Af Amer: 138 mL/min/{1.73_m2} (ref >59)
GFR, EST NON AFRICAN AMERICAN: 119 mL/min/{1.73_m2} (ref >59)
GLUCOSE: 82 mg/dL (ref 65–99)
Globulin, Total: 2.2 g/dL (ref 1.5–4.5)
Potassium: 4.6 mmol/L (ref 3.5–5.2)
Sodium: 132 mmol/L — ABNORMAL LOW (ref 134–144)
TOTAL PROTEIN: 6.4 g/dL (ref 6.0–8.5)

## 2018-02-09 LAB — LIPID PANEL
Chol/HDL Ratio: 1.8 ratio (ref 0.0–5.0)
Cholesterol, Total: 215 mg/dL — ABNORMAL HIGH (ref 100–199)
HDL: 117 mg/dL (ref >39)
LDL Calculated: 84 mg/dL (ref 0–99)
TRIGLYCERIDES: 72 mg/dL (ref 0–149)
VLDL Cholesterol Cal: 14 mg/dL (ref 5–40)

## 2018-02-09 LAB — T4, FREE: Free T4: 1.02 ng/dL (ref 0.82–1.77)

## 2018-02-09 LAB — HEMOGLOBIN A1C
Est. average glucose Bld gHb Est-mCnc: 105 mg/dL
Hgb A1c MFr Bld: 5.3 % (ref 4.8–5.6)

## 2018-02-09 LAB — TSH: TSH: 0.256 u[IU]/mL — AB (ref 0.450–4.500)

## 2018-02-10 ENCOUNTER — Other Ambulatory Visit: Payer: Self-pay | Admitting: Urgent Care

## 2018-02-13 ENCOUNTER — Encounter: Payer: Self-pay | Admitting: Internal Medicine

## 2018-02-21 ENCOUNTER — Other Ambulatory Visit: Payer: Self-pay

## 2018-02-21 ENCOUNTER — Ambulatory Visit (INDEPENDENT_AMBULATORY_CARE_PROVIDER_SITE_OTHER): Payer: Medicare Other | Admitting: Orthopaedic Surgery

## 2018-02-21 NOTE — Patient Outreach (Addendum)
[facility]) Care Management  02/21/2018  Keith Parker Mar 03, 1976 [id number]  TELEPHONE SCREENING Referral date: 02/08/18 Referral source: primary MD Referral reason: transportation assistance Insurance:  United health care Attempt #1   Telephone call to patient regarding referral. Unable to reach patient. Contact answering phone states patient is not at home.  HIPAA compliant  message left with call back phone number.   PLAN: RNCM will attempt 2nd telephone call to patient within 4 business days. RNCM will send outreach letter.   [name] RN,BSN,CCM [facility] Telephonic  [phone]

## 2018-02-22 ENCOUNTER — Ambulatory Visit: Payer: Self-pay

## 2018-02-27 ENCOUNTER — Other Ambulatory Visit: Payer: Self-pay

## 2018-02-27 NOTE — Patient Outreach (Signed)
[facility]) Care Management  02/27/2018  Keith Parker 1976-06-19 [id number]   TELEPHONE SCREENING Referral date: 02/08/18 Referral source: primary MD Referral reason: transportation assistance Insurance:  United health care Attempt #2   Telephone call to patient regarding referral. Unable to reach patient. HIPAA compliant voice message left with call back phone number.    PLAN: RNCM will attempt 3rd  telephone call to patient within 4 business days.   [name] RN,BSN,CCM [facility] Telephonic  [phone]

## 2018-03-05 ENCOUNTER — Other Ambulatory Visit: Payer: Self-pay

## 2018-03-05 NOTE — Patient Outreach (Signed)
[place] [facility]) Care Management  03/05/2018  Keith Parker 30-Jun-1975 [id number]   TELEPHONE SCREENING Referral date:02/08/18 Referral source:primary MD Referral reason:transportation assistance Insurance:United health care Attempt #3   Telephone call to patient's father  regarding referral. Unable to reach patient.HIPAA compliant voice message left with call back phone number.    PLAN: If no return call will proceed with case closure.   [name] RN,BSN,CCM [facility] Telephonic  [phone]

## 2018-03-07 ENCOUNTER — Other Ambulatory Visit: Payer: Self-pay

## 2018-03-07 NOTE — Patient Outreach (Signed)
[place] [facility]) Care Management  03/07/2018  Keith Parker 19-Apr-1976 [id number]  No response after 3 telephone calls and outreach letter attempt.  PLAN: RNCM will close patient due to being unable to reach.  RNCM will send closure notification to patient's primary MD   [name] RN,BSN,CCM [facility] Telephonic  [phone]

## 2018-03-18 ENCOUNTER — Encounter: Payer: Self-pay | Admitting: Internal Medicine

## 2018-03-20 ENCOUNTER — Other Ambulatory Visit: Payer: Self-pay

## 2018-03-20 NOTE — Patient Outreach (Signed)
[facility]) Care Management  03/20/2018  ROTH RESS 1976-01-18 [id number]  TELEPHONE SCREENING Referral date: 03/18/18 Referral source: self Referral reason: transportation assistance Insurance: United health care Attempt #1  Received voice mail message from patient.  Attempted return call to patient. Unable to reach. HIPAA compliant voice message left with call back phone number.   PLAN: RNCM will attempt 2nd telephone call to patient within 4 business days. RNCM will send outreach letter.   [name] RN,BSN, [place] Telephonic  [phone]

## 2018-03-21 ENCOUNTER — Other Ambulatory Visit: Payer: Self-pay

## 2018-03-21 NOTE — Patient Outreach (Signed)
[facility]) Care Management  03/21/2018  CAILEN MIHALIK 1975/09/08 [id number]  TELEPHONE SCREENING Referral date: 03/18/18 Referral source: self Referral reason: transportation assistance Insurance: United health care Attempt #2  Telephone call to patient.  Contact answering phone states he will have patient return call. HIPAA compliant message left with call back phone number.   PLAN: RNCM will attempt 3rd telephone call within 4 business days.   [name] RN,BSN, [place] Telephonic  [phone]

## 2018-03-22 ENCOUNTER — Other Ambulatory Visit: Payer: Self-pay

## 2018-03-22 NOTE — Progress Notes (Signed)
This encounter was created in error - please disregard.

## 2018-03-22 NOTE — Patient Outreach (Signed)
[facility] Parker) Care Management  03/22/2018  Keith Parker Nov 26, 1975 [id number]  TELEPHONE SCREENING Referral date:03/18/18 Referral source:self Referral reason:transportation assistance Insurance:[name] care  Telephone call to patient regarding referral. HIPAA verified. Explained reason for call. Patient states he is mainly in need of transportation assistance.  He report his next appointment to the gastroenterologist in on April 29, 2018.  RNCM discussed and offered [facility] care management services. Patient verbally agreed.  Patient states he doesn't have the drive to want to do anything. Denies taking medication for anxiety/ depression symptoms.  Patient verbally agreed to social worker follow up related to depression like symptoms.  Patient states he has polio and falls a lot.  Patient states this is something he has done all his life.  He reports an average of 2 falls per week.  Patient denied need for nursing follow up.   ASSESSMENT: PHQ2 - 4,   PHQ9 - 16  PLAN:  RNCM will refer patient to social worker for transportation assistance and follow up on depression like symptoms.   Keith Plowman RN,BSN,CCM [facility] Telephonic  [phone]

## 2018-03-22 NOTE — Addendum Note (Signed)
Addended by: Oswaldo Done on: 3/96/7289 04:09 PM   Modules accepted: Level of Service, SmartSet

## 2018-03-25 ENCOUNTER — Other Ambulatory Visit: Payer: Self-pay | Admitting: Licensed Clinical Social Worker

## 2018-03-25 NOTE — Patient Outreach (Signed)
[place] [facility]) Care Management  03/25/2018  TSUGIO ELISON 1976/05/16 [id number]  Assessment-CSW completed outreach attempt today after receiving new referral for transportation and mental health resource assistance. CSW unable to reach patient successfully. CSW left a HIPPA compliant voice message encouraging patient to return call once available.  Plan-CSW will await return call or complete an additional outreach if needed.  Eula Fried, BSW, MSW, [email or web address] Phone: [phone] Fax: [phone]

## 2018-03-26 ENCOUNTER — Ambulatory Visit: Payer: Self-pay

## 2018-03-27 ENCOUNTER — Other Ambulatory Visit: Payer: Self-pay | Admitting: Licensed Clinical Social Worker

## 2018-03-27 NOTE — Patient Outreach (Signed)
[place]) Care Management  03/27/2018  Keith Parker 1975/12/01 [id number]  Assessment-CSW completed second outreach attempt today. CSW unable to reach patient successfully. CSW left a HIPPA compliant voice message encouraging patient to return call once available. [facility] CSW will mail unsuccessful outreach letter to patient's residence at this time and will complete final outreach attempt within one week if no return call has been made.   Eula Fried, BSW, MSW, [email or web address] Phone: [phone] Fax: [phone]

## 2018-04-08 ENCOUNTER — Other Ambulatory Visit: Payer: Self-pay | Admitting: Licensed Clinical Social Worker

## 2018-04-08 NOTE — Patient Outreach (Signed)
[facility] [facility]) Care Management  04/08/2018  Keith Parker 08/17/1975 [id number]  Assessment-CSW completed final outreach attempt today on 04/08/18. CSW unable to reach patient successfully. CSW left a HIPPA compliant voice message encouraging patient to return call once available if still interested in [place]. [facility] CSW will complete case closure at this time due to inability to establish contact with patient after three outreach attempts.   Eula Fried, BSW, MSW, [email or web address] Phone: [phone] Fax: [phone]

## 2018-04-29 ENCOUNTER — Ambulatory Visit: Payer: Medicare Other | Admitting: Internal Medicine

## 2018-05-28 ENCOUNTER — Emergency Department (HOSPITAL_COMMUNITY): Payer: Medicare Other

## 2018-05-28 ENCOUNTER — Emergency Department (HOSPITAL_COMMUNITY)
Admission: EM | Admit: 2018-05-28 | Discharge: 2018-05-29 | Disposition: A | Discharge status: Home / Self Care | Payer: Medicare Other | Attending: Emergency Medicine | Admitting: Emergency Medicine

## 2018-05-28 ENCOUNTER — Encounter (HOSPITAL_COMMUNITY): Payer: Self-pay | Admitting: Emergency Medicine

## 2018-05-28 DIAGNOSIS — L03116 Cellulitis of left lower limb: Secondary | ICD-10-CM | POA: Diagnosis not present

## 2018-05-28 DIAGNOSIS — I10 Essential (primary) hypertension: Secondary | ICD-10-CM | POA: Diagnosis not present

## 2018-05-28 DIAGNOSIS — M7989 Other specified soft tissue disorders: Secondary | ICD-10-CM | POA: Diagnosis not present

## 2018-05-28 DIAGNOSIS — M79672 Pain in left foot: Secondary | ICD-10-CM | POA: Diagnosis present

## 2018-05-28 DIAGNOSIS — F17210 Nicotine dependence, cigarettes, uncomplicated: Secondary | ICD-10-CM | POA: Diagnosis not present

## 2018-05-28 NOTE — ED Triage Notes (Signed)
Pt has had left foot swelling and pain for three days.  Foot is swollen, red and warm.  Good pulses in foot.

## 2018-05-29 MED ORDER — DOXYCYCLINE HYCLATE 100 MG PO CAPS: 100.0000 mg | ORAL_CAPSULE | Freq: Two times a day (BID) | ORAL | 0 refills | Status: DC

## 2018-05-29 MED ORDER — DOXYCYCLINE HYCLATE 100 MG PO TABS
100.0000 mg | ORAL_TABLET | Freq: Once | ORAL | Status: AC
  Administered 2018-05-29: 100 mg via ORAL
  Filled 2018-05-29: qty 1

## 2018-05-29 MED ORDER — IBUPROFEN 600 MG PO TABS: 600.0000 mg | ORAL_TABLET | Freq: Four times a day (QID) | ORAL | 0 refills | Status: DC | PRN

## 2018-05-29 MED ORDER — IBUPROFEN 800 MG PO TABS
800.0000 mg | ORAL_TABLET | Freq: Once | ORAL | Status: AC
  Administered 2018-05-29: 800 mg via ORAL
  Filled 2018-05-29: qty 1

## 2018-05-29 NOTE — ED Notes (Signed)
Pt stable, ambulatory, and verbalizes understanding of d/c instructions.  

## 2018-05-29 NOTE — ED Provider Notes (Signed)
[facility]PART[facility] Provider Note   CSN: [id number] Arrival date & time: 05/28/18  2302     History   Chief Complaint Chief Complaint  Patient presents with  . Foot Pain    HPI Keith Parker is a 42 y.o. male.  The history is provided by the patient and a significant other. No language interpreter was used.  Foot Pain      42 year old male with history of alcohol abuse, depression, polio, hypertension presenting complaining of left foot pain.  History obtained through patient and through girlfriend who is at bedside.  Patient recall putting his left foot into his boot when he felt a sharp pain.  Since then he has noticed increasing pain swelling and redness involving his foot.  Pain is sharp throbbing moderate in severity worsening with palpation.  No associated fever or chills, no numbness or weakness.  Aside from rest elevation, no specific treatment tried.  Patient does admits to drinking alcohol regularly as well as heavy tobacco use which he is currently trying to slow down.  He also mention that polio is affecting his right lower extremity, therefore he puts most of his weight to his left leg.  He denies any history of diabetes.  Past Medical History:  Diagnosis Date  . Allergy   . Arthritis   . Depression   . Elevated liver enzymes 10/29/2017  . GERD (gastroesophageal reflux disease)   . Hearing loss, conductive, bilateral 12/31/2017  . Hypertension   . Polio   . Seizures ([place])    last seizure about 7 years ago.   . Substance abuse ([place])    Alcohol abuse    Patient Active Problem List   Diagnosis Date Noted  . Hearing loss, conductive, bilateral 12/31/2017  . Elevated liver enzymes 10/29/2017  . Heavy cigarette smoker 07/04/2017  . Elevated blood pressure reading without diagnosis of hypertension 07/04/2017  . Insomnia 07/04/2017  . 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 Chronic right hip pain 07/04/2017  . Generalized anxiety disorder  02/01/2015  . Seasonal allergies 02/01/2015  . Rash and nonspecific skin eruption 02/01/2015  . Alcohol abuse 02/01/2015    Past Surgical History:  Procedure Laterality Date  . LEG SURGERY  1988   8 surgeries-hips and legs for walking after getting polio- last one 1988  . WRIST SURGERY     right        Home Medications    Prior to Admission medications   Medication Sig Start Date End Date Taking? Authorizing Provider  albuterol (PROVENTIL HFA;VENTOLIN HFA) 108 (90 Base) MCG/ACT inhaler Inhale 1-2 puffs into the lungs every 6 (six) hours as needed for wheezing or shortness of breath. 10/22/17   Jaynee Eagles, PA-C  amLODipine (NORVASC) 5 MG tablet Take 1 tablet (5 mg total) by mouth daily. 10/22/17   Jaynee Eagles, PA-C  levocetirizine (XYZAL) 5 MG tablet Take 1 tablet (5 mg total) by mouth every evening. 10/22/17   Jaynee Eagles, PA-C    Family History Family History  Problem Relation Age of Onset  . Cancer Mother        Breast  . Arthritis Mother   . Alcohol abuse Father   . Arthritis Father   . Hypertension Father   . Cancer Maternal Grandmother        Breast  . Hypertension Maternal Grandmother   . Hypertension Maternal Grandfather   . Hypertension Paternal Grandmother   . Hypertension Paternal Grandfather   . Drug abuse Maternal  Uncle     Social History Social History   Tobacco Use  . Smoking status: Current Every Day Smoker    Packs/day: 2.00    Years: 25.00    Pack years: 50.00    Types: Cigarettes  . Smokeless tobacco: Never Used  Substance Use Topics  . Alcohol use: Yes    Alcohol/week: 70.0 standard drinks    Types: 70 Cans of beer per week    Comment: 2 40 ounces per day   . Drug use: No     Allergies   Patient has no known allergies.   Review of Systems Review of Systems  Constitutional: Negative for fever.  Skin: Positive for rash.     Physical Exam Updated Vital Signs BP (!) 132/97 (BP Location: Right Arm)   Pulse 83   Temp 98.2 F  (36.8 C) (Oral)   Resp 18   SpO2 97%   Physical Exam  Constitutional: He appears well-developed and well-nourished. No distress.  HENT:  Head: Atraumatic.  Eyes: Conjunctivae are normal.  Neck: Neck supple.  Musculoskeletal: He exhibits tenderness (Left lower extremity: Moderate erythema, edema, and warmth noted to the dorsum of the foot involving the toes with faint lymphangitis extending towards the ankle with normal ankle range of motion.  Dorsalis pedis pulse palpable with brisk cap refill.  ).  Neurological: He is alert.  Skin: No rash noted.  Psychiatric: He has a normal mood and affect.  Nursing note and vitals reviewed.    ED Treatments / Results  Labs (all labs ordered are listed, but only abnormal results are displayed) Labs Reviewed - No data to display  EKG None  Radiology Dg Foot Complete Left  Result Date: 05/28/2018 CLINICAL DATA:  42 year old male with swelling of the left foot. EXAM: LEFT FOOT - COMPLETE 3+ VIEW COMPARISON:  Left ankle CT dated 03/29/2011 FINDINGS: There is no acute fracture or dislocation. The bones are well mineralized. No arthritic changes. Soft tissue swelling of the forefoot. No soft tissue gas or radiopaque foreign object. IMPRESSION: 1. No acute osseous pathology. 2. Soft tissue swelling of the forefoot may represent cellulitis. Clinical correlation is recommended. Electronically Signed   By: Anner Crete M.D.   On: 05/28/2018 23:58    Procedures Procedures (including critical care time)  Medications Ordered in ED Medications  doxycycline (VIBRA-TABS) tablet 100 mg (100 mg Oral Given 05/29/18 0211)  ibuprofen (ADVIL,MOTRIN) tablet 800 mg (800 mg Oral Given 05/29/18 0211)     Initial Impression / Assessment and Plan / ED Course  I have reviewed the triage vital signs and the nursing notes.  Pertinent labs & imaging results that were available during my care of the patient were reviewed by me and considered in my medical decision  making (see chart for details).     BP (!) 127/93   Pulse 86   Temp 98.2 F (36.8 C) (Oral)   Resp 18   SpO2 99%    Final Clinical Impressions(s) / ED Diagnoses   Final diagnoses:  Cellulitis of left foot    ED Discharge Orders         Ordered    doxycycline (VIBRAMYCIN) 100 MG capsule  2 times daily     05/29/18 0219    ibuprofen (ADVIL,MOTRIN) 600 MG tablet  Every 6 hours PRN     05/29/18 0219         2:15 AM Patient developed pain swelling redness and warmth involving his left foot approximately 3 days  ago after he inserted his foot into his shoes.  I suspect he may have been bitten by an insect leading to developing cellulitis.  No evidence of abscess.  He is neurovascularly intact.  An x-ray obtained showed no evidence of fracture or dislocation but evidence of soft tissue swelling suggestive of cellulitis.  He does have some faint lymphangitis that is starting to develop.  He has no joint pain to suggest septic joint.  He is a heavy alcohol and tobacco user in which I encourage patient to seek help decreased use as it can prolong his infection.  He does not have any systemic manifestation.  He is afebrile with stable normal vital sign.  Plan to discharge home with doxycycline and ibuprofen.  Encourage patient to return in 48 hours for skin recheck if symptoms not improving.   Domenic Moras, PA-C 05/29/18 [id number]    Merryl Hacker, MD 05/29/18 [phone]

## 2018-05-29 NOTE — ED Notes (Signed)
ED Provider at bedside. 

## 2018-06-29 ENCOUNTER — Emergency Department (HOSPITAL_COMMUNITY)
Admission: EM | Admit: 2018-06-29 | Discharge: 2018-06-30 | Disposition: A | Discharge status: Home / Self Care | Payer: Medicare Other | Attending: Emergency Medicine | Admitting: Emergency Medicine

## 2018-06-29 DIAGNOSIS — I10 Essential (primary) hypertension: Secondary | ICD-10-CM | POA: Insufficient documentation

## 2018-06-29 DIAGNOSIS — R609 Edema, unspecified: Secondary | ICD-10-CM | POA: Diagnosis not present

## 2018-06-29 DIAGNOSIS — Z79899 Other long term (current) drug therapy: Secondary | ICD-10-CM | POA: Diagnosis not present

## 2018-06-29 DIAGNOSIS — S90112A Contusion of left great toe without damage to nail, initial encounter: Secondary | ICD-10-CM | POA: Diagnosis not present

## 2018-06-29 DIAGNOSIS — Y9301 Activity, walking, marching and hiking: Secondary | ICD-10-CM | POA: Diagnosis not present

## 2018-06-29 DIAGNOSIS — R52 Pain, unspecified: Secondary | ICD-10-CM | POA: Diagnosis not present

## 2018-06-29 DIAGNOSIS — Y999 Unspecified external cause status: Secondary | ICD-10-CM | POA: Insufficient documentation

## 2018-06-29 DIAGNOSIS — S90212A Contusion of left great toe with damage to nail, initial encounter: Secondary | ICD-10-CM | POA: Diagnosis not present

## 2018-06-29 DIAGNOSIS — Y929 Unspecified place or not applicable: Secondary | ICD-10-CM | POA: Insufficient documentation

## 2018-06-29 DIAGNOSIS — W228XXA Striking against or struck by other objects, initial encounter: Secondary | ICD-10-CM | POA: Insufficient documentation

## 2018-06-29 DIAGNOSIS — W19XXXA Unspecified fall, initial encounter: Secondary | ICD-10-CM | POA: Diagnosis not present

## 2018-06-29 DIAGNOSIS — S99922A Unspecified injury of left foot, initial encounter: Secondary | ICD-10-CM | POA: Diagnosis present

## 2018-06-29 DIAGNOSIS — F17210 Nicotine dependence, cigarettes, uncomplicated: Secondary | ICD-10-CM | POA: Diagnosis not present

## 2018-06-29 NOTE — ED Notes (Signed)
Bed: WA07 Expected date:  Expected time:  Means of arrival:  Comments: EMS fractured great toe/triage

## 2018-06-29 NOTE — ED Triage Notes (Signed)
Patient arrives by [facility]-tripped over brother's foot-hx polio-injured left great toe-swelling and pain. EMS administered Fentanyl 100 mcg IV-#18 left ACF.

## 2018-06-30 ENCOUNTER — Other Ambulatory Visit: Payer: Self-pay

## 2018-06-30 ENCOUNTER — Emergency Department (HOSPITAL_COMMUNITY): Payer: Medicare Other

## 2018-06-30 ENCOUNTER — Encounter (HOSPITAL_COMMUNITY): Payer: Self-pay | Admitting: Emergency Medicine

## 2018-06-30 ENCOUNTER — Other Ambulatory Visit (HOSPITAL_COMMUNITY): Payer: Medicare Other

## 2018-06-30 DIAGNOSIS — S90212A Contusion of left great toe with damage to nail, initial encounter: Secondary | ICD-10-CM | POA: Diagnosis not present

## 2018-06-30 MED ORDER — HYDROCODONE-ACETAMINOPHEN 5-325 MG PO TABS
1.0000 | ORAL_TABLET | Freq: Once | ORAL | Status: AC
  Administered 2018-06-30: 1 via ORAL
  Filled 2018-06-30: qty 1

## 2018-06-30 MED ORDER — NAPROXEN 500 MG PO TABS: 500.0000 mg | ORAL_TABLET | Freq: Two times a day (BID) | ORAL | 0 refills | Status: DC | PRN

## 2018-06-30 NOTE — Discharge Instructions (Addendum)
Your x-ray is negative.  Keep the foot elevated, use ice and take the anti-inflammatories as prescribed.  Follow-up with your PCP or the foot doctor.  Return to the ED with new or worsening symptoms.

## 2018-06-30 NOTE — ED Provider Notes (Signed)
[facility] DEPT Provider Note   CSN: [id number] Arrival date & time: 06/29/18  2341     History   Chief Complaint Chief Complaint  Patient presents with  . Toe Injury    HPI Keith Parker is a 43 y.o. male.  Patient with history of polio presenting with toe injury.  States he was attempting to walk barefoot and struck his left foot against the shoe on the ground causing hyper flexion to his left great toe.  States he normally drags his toes when he walks.  His left leg is the stronger of the 2.  He is supposed to walk with a cane but is not.  Did not fall or hit his head.  Complains of pain to the left great toe with ecchymosis, swelling and edema.  No breaks in the skin.  Denies any other injury.  No head, neck, back pain.  No weakness, numbness or tingling.  EMS gave him fentanyl and now he is having no pain.  The history is provided by the patient and the EMS personnel.    Past Medical History:  Diagnosis Date  . Allergy   . Arthritis   . Depression   . Elevated liver enzymes 10/29/2017  . GERD (gastroesophageal reflux disease)   . Hearing loss, conductive, bilateral 12/31/2017  . Hypertension   . Polio   . Seizures ([place])    last seizure about 7 years ago.   . Substance abuse ([place])    Alcohol abuse    Patient Active Problem List   Diagnosis Date Noted  . Hearing loss, conductive, bilateral 12/31/2017  . Elevated liver enzymes 10/29/2017  . Heavy cigarette smoker 07/04/2017  . Elevated blood pressure reading without diagnosis of hypertension 07/04/2017  . Insomnia 07/04/2017  . 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 Chronic right hip pain 07/04/2017  . Generalized anxiety disorder 02/01/2015  . Seasonal allergies 02/01/2015  . Rash and nonspecific skin eruption 02/01/2015  . Alcohol abuse 02/01/2015    Past Surgical History:  Procedure Laterality Date  . LEG SURGERY  1988   8 surgeries-hips and legs for walking after getting polio- last  one 1988  . WRIST SURGERY     right        Home Medications    Prior to Admission medications   Medication Sig Start Date End Date Taking? Authorizing Provider  doxycycline (VIBRAMYCIN) 100 MG capsule Take 1 capsule (100 mg total) by mouth 2 (two) times daily. One po bid x 7 days 05/29/18   Domenic Moras, PA-C  ibuprofen (ADVIL,MOTRIN) 600 MG tablet Take 1 tablet (600 mg total) by mouth every 6 (six) hours as needed. 05/29/18   Domenic Moras, PA-C    Family History Family History  Problem Relation Age of Onset  . Cancer Mother        Breast  . Arthritis Mother   . Alcohol abuse Father   . Arthritis Father   . Hypertension Father   . Cancer Maternal Grandmother        Breast  . Hypertension Maternal Grandmother   . Hypertension Maternal Grandfather   . Hypertension Paternal Grandmother   . Hypertension Paternal Grandfather   . Drug abuse Maternal Uncle     Social History Social History   Tobacco Use  . Smoking status: Current Every Day Smoker    Packs/day: 2.00    Years: 25.00    Pack years: 50.00    Types: Cigarettes  . Smokeless tobacco: Never  Used  Substance Use Topics  . Alcohol use: Yes    Alcohol/week: 70.0 standard drinks    Types: 70 Cans of beer per week    Comment: 2 40 ounces per day   . Drug use: No     Allergies   Patient has no known allergies.   Review of Systems Review of Systems  Constitutional: Negative for activity change and appetite change.  HENT: Negative for congestion and rhinorrhea.   Respiratory: Negative for chest tightness and shortness of breath.   Cardiovascular: Negative for chest pain.  Gastrointestinal: Negative for abdominal pain, nausea and vomiting.  Genitourinary: Negative for dysuria and hematuria.  Musculoskeletal: Positive for arthralgias and myalgias.  Neurological: Negative for dizziness, weakness and headaches.   all other systems are negative except as noted in the HPI and PMH.     Physical Exam Updated Vital  Signs BP 136/81   Pulse (!) 105   Resp 17   SpO2 98%   Physical Exam Vitals signs and nursing note reviewed.  Constitutional:      General: He is not in acute distress.    Appearance: He is well-developed.  HENT:     Head: Normocephalic and atraumatic.     Mouth/Throat:     Pharynx: No oropharyngeal exudate.  Eyes:     Conjunctiva/sclera: Conjunctivae normal.     Pupils: Pupils are equal, round, and reactive to light.  Neck:     Musculoskeletal: Normal range of motion and neck supple.     Comments: No meningismus. Cardiovascular:     Rate and Rhythm: Normal rate and regular rhythm.     Heart sounds: Normal heart sounds. No murmur.  Pulmonary:     Effort: Pulmonary effort is normal. No respiratory distress.     Breath sounds: Normal breath sounds.  Abdominal:     Palpations: Abdomen is soft.     Tenderness: There is no abdominal tenderness. There is no guarding or rebound.  Musculoskeletal: Normal range of motion.        General: Tenderness present.     Comments: Ecchymosis to L great toe without breaks in the skin. TTP. INtact DP and PT pulses. No pain with ROM of ankle or knee  Skin:    General: Skin is warm.     Capillary Refill: Capillary refill takes less than 2 seconds.     Findings: Bruising present.  Neurological:     Mental Status: He is alert and oriented to person, place, and time.     Cranial Nerves: No cranial nerve deficit.     Motor: No abnormal muscle tone.     Coordination: Coordination normal.     Comments: No ataxia on finger to nose bilaterally. No pronator drift. 5/5 strength throughout. CN 2-12 intact.Equal grip strength. Sensation intact.   Psychiatric:        Behavior: Behavior normal.      ED Treatments / Results  Labs (all labs ordered are listed, but only abnormal results are displayed) Labs Reviewed - No data to display  EKG None  Radiology Dg Toe Great Left  Result Date: 06/30/2018 CLINICAL DATA:  Acute onset of left great toe  pain. Caught left toe while walking. Bruising about the left great toe. Initial encounter. EXAM: LEFT GREAT TOE COMPARISON:  Left foot radiographs performed 05/28/2018 FINDINGS: There is no evidence of fracture or dislocation. Visualized joint spaces are preserved. No definite soft tissue abnormalities are characterized on radiograph. IMPRESSION: No evidence of fracture or dislocation.  Electronically Signed   By: Garald Balding M.D.   On: 06/30/2018 00:42    Procedures Procedures (including critical care time)  Medications Ordered in ED Medications - No data to display   Initial Impression / Assessment and Plan / ED Course  I have reviewed the triage vital signs and the nursing notes.  Pertinent labs & imaging results that were available during my care of the patient were reviewed by me and considered in my medical decision making (see chart for details).    L great toe injury. Neurovascular intact.  No head or neck injury.  X-rays negative for fracture.  Patient will be given supportive care with cast shoe, ice, elevation, follow-up with PCP and podiatry.  Return precautions discussed Final Clinical Impressions(s) / ED Diagnoses   Final diagnoses:  Contusion of left great toe without damage to nail, initial encounter    ED Discharge Orders    None       Koriana Stepien, Annie Main, MD 06/30/18 [phone]

## 2018-09-05 ENCOUNTER — Ambulatory Visit (HOSPITAL_COMMUNITY)
Admission: EM | Admit: 2018-09-05 | Discharge: 2018-09-05 | Disposition: A | Discharge status: Home / Self Care | Payer: Medicare Other | Attending: Family Medicine | Admitting: Family Medicine

## 2018-09-05 ENCOUNTER — Ambulatory Visit (INDEPENDENT_AMBULATORY_CARE_PROVIDER_SITE_OTHER): Payer: Medicare Other

## 2018-09-05 ENCOUNTER — Encounter (HOSPITAL_COMMUNITY): Payer: Self-pay | Admitting: Emergency Medicine

## 2018-09-05 ENCOUNTER — Other Ambulatory Visit: Payer: Self-pay

## 2018-09-05 DIAGNOSIS — S8991XA Unspecified injury of right lower leg, initial encounter: Secondary | ICD-10-CM | POA: Diagnosis not present

## 2018-09-05 DIAGNOSIS — S82034A Nondisplaced transverse fracture of right patella, initial encounter for closed fracture: Secondary | ICD-10-CM

## 2018-09-05 DIAGNOSIS — W19XXXA Unspecified fall, initial encounter: Secondary | ICD-10-CM

## 2018-09-05 DIAGNOSIS — S82091A Other fracture of right patella, initial encounter for closed fracture: Secondary | ICD-10-CM | POA: Diagnosis not present

## 2018-09-05 MED ORDER — KETOROLAC TROMETHAMINE 60 MG/2ML IM SOLN
60.0000 mg | Freq: Once | INTRAMUSCULAR | Status: AC
  Administered 2018-09-05: 60 mg via INTRAMUSCULAR

## 2018-09-05 MED ORDER — HYDROCODONE-ACETAMINOPHEN 5-325 MG PO TABS: 2.0000 | ORAL_TABLET | ORAL | 0 refills | Status: DC | PRN

## 2018-09-05 MED ORDER — KETOROLAC TROMETHAMINE 60 MG/2ML IM SOLN
INTRAMUSCULAR | Status: AC
  Filled 2018-09-05: qty 2

## 2018-09-05 NOTE — ED Triage Notes (Signed)
Pt presents to [facility] after her tripped Tuesday and fall, his knee bent.  Pt has a hx of polio from childhood, states that its always been his bad leg.  Swelling noted to right knee.

## 2018-09-05 NOTE — ED Provider Notes (Signed)
[facility]    CSN: [id number] Arrival date & time: 09/05/18  1206     History   Chief Complaint Chief Complaint  Patient presents with  . Knee Pain    HPI Keith Parker is a 43 y.o. male.   Patient has history of polio and falls.  Yesterday he fell directly onto his right knee.  States he did not feel pain immediately but when he tried to stand up today felt pain.  Pain is over the kneecap.  HPI  Past Medical History:  Diagnosis Date  . Allergy   . Arthritis   . Depression   . Elevated liver enzymes 10/29/2017  . GERD (gastroesophageal reflux disease)   . Hearing loss, conductive, bilateral 12/31/2017  . Hypertension   . Polio   . Seizures ([place])    last seizure about 7 years ago.   . Substance abuse ([place])    Alcohol abuse    Patient Active Problem List   Diagnosis Date Noted  . Hearing loss, conductive, bilateral 12/31/2017  . Elevated liver enzymes 10/29/2017  . Heavy cigarette smoker 07/04/2017  . Elevated blood pressure reading without diagnosis of hypertension 07/04/2017  . Insomnia 07/04/2017  . 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 Chronic right hip pain 07/04/2017  . Generalized anxiety disorder 02/01/2015  . Seasonal allergies 02/01/2015  . Rash and nonspecific skin eruption 02/01/2015  . Alcohol abuse 02/01/2015    Past Surgical History:  Procedure Laterality Date  . LEG SURGERY  1988   8 surgeries-hips and legs for walking after getting polio- last one 1988  . WRIST SURGERY     right       Home Medications    Prior to Admission medications   Medication Sig Start Date End Date Taking? Authorizing Provider  doxycycline (VIBRAMYCIN) 100 MG capsule Take 1 capsule (100 mg total) by mouth 2 (two) times daily. One po bid x 7 days 05/29/18   Domenic Moras, PA-C  ibuprofen (ADVIL,MOTRIN) 600 MG tablet Take 1 tablet (600 mg total) by mouth every 6 (six) hours as needed. 05/29/18   Domenic Moras, PA-C  naproxen (NAPROSYN) 500 MG tablet Take 1 tablet  (500 mg total) by mouth 2 (two) times daily as needed. 06/30/18   Ezequiel Essex, MD    Family History Family History  Problem Relation Age of Onset  . Cancer Mother        Breast  . Arthritis Mother   . Alcohol abuse Father   . Arthritis Father   . Hypertension Father   . Cancer Maternal Grandmother        Breast  . Hypertension Maternal Grandmother   . Hypertension Maternal Grandfather   . Hypertension Paternal Grandmother   . Hypertension Paternal Grandfather   . Drug abuse Maternal Uncle     Social History Social History   Tobacco Use  . Smoking status: Current Every Day Smoker    Packs/day: 2.00    Years: 25.00    Pack years: 50.00    Types: Cigarettes  . Smokeless tobacco: Never Used  Substance Use Topics  . Alcohol use: Yes    Alcohol/week: 70.0 standard drinks    Types: 70 Cans of beer per week    Comment: 2 40 ounces per day   . Drug use: No     Allergies   Patient has no known allergies.   Review of Systems Review of Systems  Musculoskeletal: Positive for arthralgias and gait problem.  All other systems  reviewed and are negative.    Physical Exam Triage Vital Signs ED Triage Vitals  Enc Vitals Group     BP 09/05/18 1240 (!) 168/102     Pulse Rate 09/05/18 1240 78     Resp 09/05/18 1240 18     Temp 09/05/18 1240 98.5 F (36.9 C)     Temp Source 09/05/18 1240 Temporal     SpO2 09/05/18 1240 100 %     Weight --      Height --      Head Circumference --      Peak Flow --      Pain Score 09/05/18 1241 10     Pain Loc --      Pain Edu? --      Excl. in [place]? --    No data found.  Updated Vital Signs BP (!) 168/102 (BP Location: Right Arm)   Pulse 78   Temp 98.5 F (36.9 C) (Temporal)   Resp 18   SpO2 100%   Visual Acuity Right Eye Distance:   Left Eye Distance:   Bilateral Distance:    Right Eye Near:   Left Eye Near:    Bilateral Near:     Physical Exam Constitutional:      Appearance: Normal appearance.  Musculoskeletal:      Comments: Right knee: There is some soft tissue swelling over the patella and there is tenderness to palpation.  Patient is unable to extend his lower leg from knee but that is chronic due to polio X-ray shows incomplete fracture through the patella  Neurological:     Mental Status: He is alert.      UC Treatments / Results  Labs (all labs ordered are listed, but only abnormal results are displayed) Labs Reviewed - No data to display  EKG None  Radiology Dg Knee Complete 4 Views Right  Result Date: 09/05/2018 CLINICAL DATA:  Fall several days ago with persistent anterior knee pain, initial encounter EXAM: RIGHT KNEE - COMPLETE 4+ VIEW COMPARISON:  11/29/2013 FINDINGS: There is a minimally displaced patellar fracture identified in the mid body with displacement of the fracture fragments of approximately 4 mm. Small joint effusion is noted. Anterior soft tissue swelling is seen as well. IMPRESSION: Mildly displaced patellar fracture. Electronically Signed   By: Inez Catalina M.D.   On: 09/05/2018 13:21    Procedures Procedures (including critical care time)  Medications Ordered in UC Medications  ketorolac (TORADOL) injection 60 mg (has no administration in time range)   Fracture patella.  Will place in knee immobilizer with follow-up by orthopedics Initial Impression / Assessment and Plan / UC Course  I have reviewed the triage vital signs and the nursing notes.  Pertinent labs & imaging results that were available during my care of the patient were reviewed by me and considered in my medical decision making (see chart for details).     Fracture patella.  Will place in knee immobilizer with follow-up by orthopedics Final Clinical Impressions(s) / UC Diagnoses   Final diagnoses:  None   Discharge Instructions   None    ED Prescriptions    None     Controlled Substance Prescriptions [place] Controlled Substance Registry consulted? Yes, I have consulted the [place]  Controlled Substances Registry for this patient, and feel the risk/benefit ratio today is favorable for proceeding with this prescription for a controlled substance.   Wardell Honour, MD 09/05/18 1331

## 2018-09-10 DIAGNOSIS — M25561 Pain in right knee: Secondary | ICD-10-CM | POA: Diagnosis not present

## 2019-05-01 IMAGING — CR DG TOE GREAT 2+V*L*
3 series · 3 of 3 positions shown · non-contrast
Comparison: Left foot radiographs performed 05/28/2018

CLINICAL DATA: Acute onset of left great toe pain. Caught left toe
while walking. Bruising about the left great toe. Initial encounter.

EXAM:
LEFT GREAT TOE

[x toes ap left]
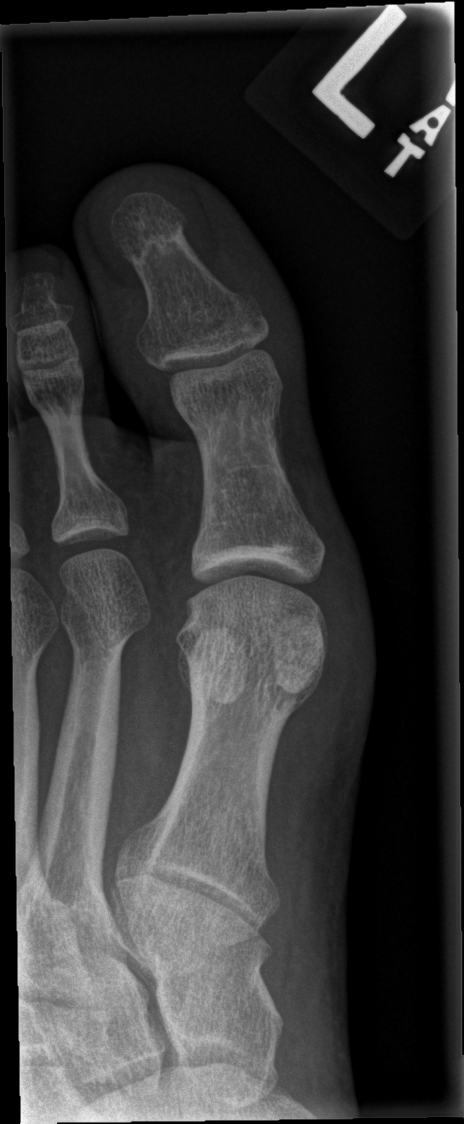

[x toes obl left]
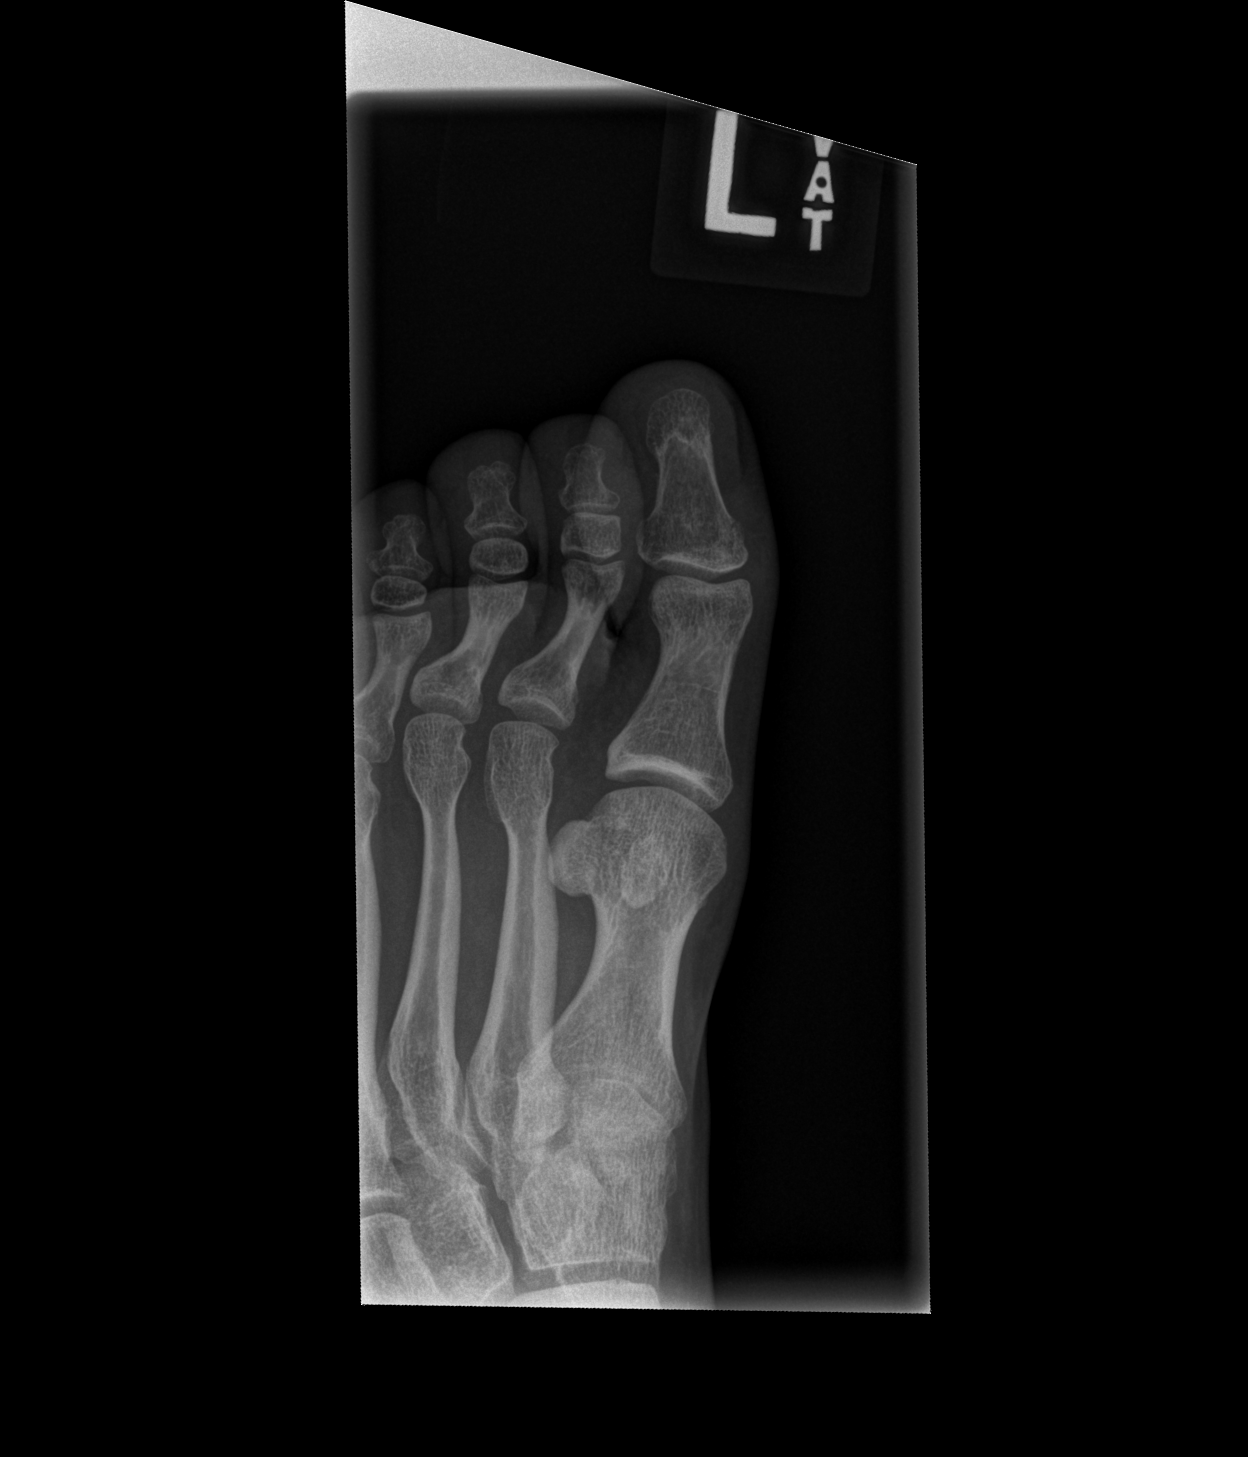

[x toes lat left]
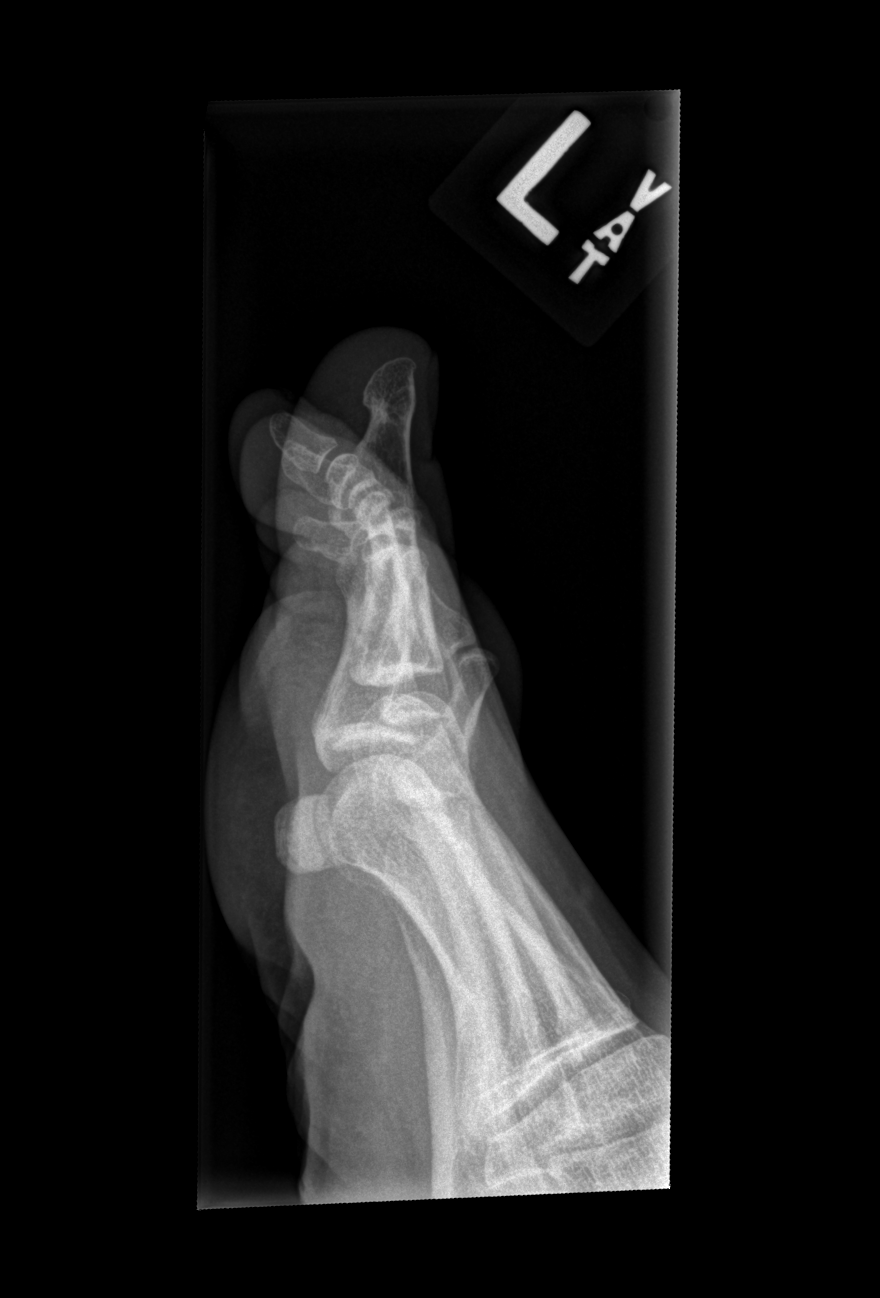

[3 of 3 positions shown; findings below may reference images not displayed]

FINDINGS: There is no evidence of fracture or dislocation. Visualized joint
spaces are preserved. No definite soft tissue abnormalities are
characterized on radiograph.
IMPRESSION: No evidence of fracture or dislocation.

## 2019-11-11 ENCOUNTER — Other Ambulatory Visit: Payer: Self-pay

## 2019-11-11 ENCOUNTER — Emergency Department: Payer: Medicare Other

## 2019-11-11 ENCOUNTER — Emergency Department
Admission: EM | Admit: 2019-11-11 | Discharge: 2019-11-11 | Disposition: A | Discharge status: Home / Self Care | Payer: Medicare Other | Attending: Emergency Medicine | Admitting: Emergency Medicine

## 2019-11-11 DIAGNOSIS — L0231 Cutaneous abscess of buttock: Secondary | ICD-10-CM | POA: Diagnosis not present

## 2019-11-11 DIAGNOSIS — F17210 Nicotine dependence, cigarettes, uncomplicated: Secondary | ICD-10-CM | POA: Insufficient documentation

## 2019-11-11 DIAGNOSIS — S3992XA Unspecified injury of lower back, initial encounter: Secondary | ICD-10-CM | POA: Diagnosis not present

## 2019-11-11 DIAGNOSIS — Y939 Activity, unspecified: Secondary | ICD-10-CM | POA: Diagnosis not present

## 2019-11-11 DIAGNOSIS — W010XXA Fall on same level from slipping, tripping and stumbling without subsequent striking against object, initial encounter: Secondary | ICD-10-CM | POA: Insufficient documentation

## 2019-11-11 DIAGNOSIS — Y929 Unspecified place or not applicable: Secondary | ICD-10-CM | POA: Diagnosis not present

## 2019-11-11 DIAGNOSIS — Y999 Unspecified external cause status: Secondary | ICD-10-CM | POA: Insufficient documentation

## 2019-11-11 DIAGNOSIS — S39012A Strain of muscle, fascia and tendon of lower back, initial encounter: Secondary | ICD-10-CM | POA: Insufficient documentation

## 2019-11-11 DIAGNOSIS — I10 Essential (primary) hypertension: Secondary | ICD-10-CM | POA: Insufficient documentation

## 2019-11-11 DIAGNOSIS — L0291 Cutaneous abscess, unspecified: Secondary | ICD-10-CM

## 2019-11-11 MED ORDER — LIDOCAINE-PRILOCAINE 2.5-2.5 % EX CREA
TOPICAL_CREAM | Freq: Once | CUTANEOUS | Status: AC
  Filled 2019-11-11: qty 5

## 2019-11-11 MED ORDER — OXYCODONE-ACETAMINOPHEN 5-325 MG PO TABS: 1.0000 | ORAL_TABLET | Freq: Four times a day (QID) | ORAL | 0 refills | Status: AC | PRN

## 2019-11-11 MED ORDER — LIDOCAINE HCL (PF) 1 % IJ SOLN
5.0000 mL | Freq: Once | INTRAMUSCULAR | Status: AC
  Administered 2019-11-11: 5 mL via INTRADERMAL
  Filled 2019-11-11: qty 5

## 2019-11-11 MED ORDER — OXYCODONE HCL 5 MG PO TABS
5.0000 mg | ORAL_TABLET | Freq: Once | ORAL | Status: AC
  Administered 2019-11-11: 5 mg via ORAL
  Filled 2019-11-11: qty 1

## 2019-11-11 MED ORDER — SULFAMETHOXAZOLE-TRIMETHOPRIM 800-160 MG PO TABS: 1.0000 | ORAL_TABLET | Freq: Two times a day (BID) | ORAL | 0 refills | Status: DC

## 2019-11-11 NOTE — ED Notes (Signed)
See triage note  States he fell couple of weeks ago  Landed on right hip area and lower back   States he has had intermittent sharp pain to that side  Also states he may have a possible abscess area to groin

## 2019-11-11 NOTE — ED Triage Notes (Signed)
Pt c/o left lower back pain since falling a couple weeks ago . Also c/o abscess on the upper inner left thigh for a week, states it has opened and drainage.

## 2019-11-11 NOTE — ED Provider Notes (Signed)
[facility] Emergency Department Provider Note ____________________________________________   First MD Initiated Contact with Patient 11/11/19 1043     (approximate)  I have reviewed the triage vital signs and the nursing notes.   HISTORY  Chief Complaint Back Pain  HPI Keith Parker is a 44 y.o. male who presents to the emergency department for treatment and evaluation of lower back pain after a mechanical, non-syncopal fall 2 weeks ago. No relief with pain patches, Epson Salt bath, and Blue Emu.  He also has what he believes to be an abscess on his left buttock. He states that he popped it, but it has come back and is worse. Some clear/white drainage yesterday and today.        Past Medical History:  Diagnosis Date   Allergy    Arthritis    Depression    Elevated liver enzymes 10/29/2017   GERD (gastroesophageal reflux disease)    Hearing loss, conductive, bilateral 12/31/2017   Hypertension    Polio    Seizures ([place])    last seizure about 7 years ago.    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 Authorizing Provider  oxyCODONE-acetaminophen (PERCOCET) 5-325 MG tablet Take 1  tablet by mouth every 6 (six) hours as needed. 11/11/19 11/10/20  Aitanna Haubner, Johnette Abraham B, FNP  sulfamethoxazole-trimethoprim (BACTRIM DS) 800-160 MG tablet Take 1 tablet by mouth 2 (two) times daily. 11/11/19   Victorino Dike, FNP    Allergies Patient has no known allergies.  Family History  Problem Relation Age of Onset   Cancer Mother        Breast   Arthritis Mother    Alcohol abuse Father    Arthritis Father    Hypertension Father    Cancer Maternal Grandmother        Breast   Hypertension Maternal Grandmother    Hypertension Maternal Grandfather    Hypertension Paternal Grandmother    Hypertension Paternal Grandfather    Drug abuse Maternal Uncle     Social History Social History   Tobacco Use   Smoking status: Current Every Day Smoker    Packs/day: 2.00    Years: 25.00    Pack years: 50.00    Types: Cigarettes   Smokeless tobacco: Never Used  Substance Use Topics   Alcohol use: Yes    Alcohol/week: 70.0 standard drinks    Types: 70 Cans of beer per week    Comment: 2 40 ounces per day    Drug use: No    Review of Systems  Constitutional: No fever/chills Eyes: No visual changes. ENT: No sore throat. Cardiovascular: Denies chest pain. Respiratory: Denies shortness of breath. Gastrointestinal: No abdominal pain.  No nausea, no vomiting.  No diarrhea.  No constipation. Genitourinary: Negative for dysuria. Musculoskeletal: Positive for back pain. Skin: Negative for rash. Neurological: Negative for headaches, focal weakness or numbness. ____________________________________________   PHYSICAL EXAM:  VITAL SIGNS: ED Triage Vitals  Enc Vitals Group     BP 11/11/19 1008 (!) 140/93     Pulse Rate 11/11/19 1008 100     Resp 11/11/19 1008 17     Temp 11/11/19 1008 98.4 F (36.9 C)     Temp Source 11/11/19 1008 Oral     SpO2 11/11/19 1008 98 %     Weight 11/11/19 1010 125 lb (56.7 kg)     Height 11/11/19 1010 5\' 2"  (1.575 m)     Head  Circumference --      Peak Flow --      Pain Score 11/11/19 1014 10     Pain Loc --      Pain Edu? --      Excl. in [place]? --     Constitutional: Alert and oriented. Well appearing and in no acute distress. Eyes: Conjunctivae are normal. Head: Atraumatic. Nose: No congestion/rhinnorhea. Mouth/Throat: Mucous membranes are moist.  Oropharynx non-erythematous. Neck: No stridor.   Hematological/Lymphatic/Immunilogical: No cervical lymphadenopathy. Cardiovascular: Normal rate, regular rhythm. Grossly normal heart sounds.  Good peripheral circulation. Respiratory: Normal respiratory effort.  No retractions. Lungs CTAB. Gastrointestinal: Soft and nontender. No distention. No abdominal bruits. No CVA tenderness. Genitourinary:  Musculoskeletal: Left lower transverse lumbar/sacral pain. No lower extremity tenderness nor edema.  No joint effusions. Neurologic:  Normal speech and language. No gross focal neurologic deficits are appreciated. No gait instability. Skin:  Abscess to skin fold between left testicle and buttock with fluctuance. No obvious surrounding cellulitis. Psychiatric: Mood and affect are normal. Speech and behavior are normal.  ____________________________________________   LABS (all labs ordered are listed, but only abnormal results are displayed)  Labs Reviewed - No data to display ____________________________________________  EKG  Not indicated. ____________________________________________  RADIOLOGY  ED MD interpretation:    Image of the lumbar spine shows a mild L5-S1 disc height loss without any acute osseous abnormality.  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  I, Sherrie George, personally viewed and evaluated these images (plain radiographs) as part of my medical decision making, as well as reviewing the written report by the radiologist.  Official radiology report(s): DG Lumbar Spine 2-3 Views  Result Date: 11/11/2019 CLINICAL DATA:   Back pain after fall 10 days ago EXAM: LUMBAR SPINE - 2-3 VIEW COMPARISON:  11/25/2004, 01/09/2013 FINDINGS: Five lumbar type vertebral segments. Vertebral body heights are maintained without evidence of fracture. 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 Similar degree of mild L5-S1 intervertebral disc height loss. No advanced facet arthropathy. Bilateral SI joints appear intact and unremarkable. IMPRESSION: 1. No acute osseous abnormality. 2. Mild L5-S1 disc height loss. 3. 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 Electronically Signed   By: Davina Poke D.O.   On: 11/11/2019 11:38    ____________________________________________   PROCEDURES  Procedure(s) performed (including Critical Care):  Marland KitchenMarland KitchenIncision and Drainage  Date/Time: 11/11/2019 11:17 AM Performed by: Victorino Dike, FNP Authorized by: Victorino Dike, FNP   Consent:    Consent obtained:  Verbal   Consent given by:  Patient   Risks discussed:  Incomplete drainage and pain Location:    Type:  Abscess   Location:  Anogenital   Anogenital location: skin fold of scrotum  and buttock. Pre-procedure details:    Skin preparation:  Betadine Anesthesia (see MAR for exact dosages):    Anesthesia method:  Topical application and local infiltration   Topical anesthetic:  EMLA cream   Local anesthetic:  Lidocaine 1% WITH epi Procedure type:    Complexity:  Complex Procedure details:    Incision types:  Single straight   Incision depth:  Dermal   Scalpel blade:  11   Wound management:  Probed and deloculated   Drainage amount:  Moderate   Wound treatment:  Wound left open   Packing materials:  None Post-procedure details:    Patient tolerance of procedure:  Tolerated well, no immediate complications    ____________________________________________   INITIAL IMPRESSION / ASSESSMENT AND PLAN     44 year old male presenting to the emergency department for treatment and  evaluation after sustaining a mechanical, nonsyncopal fall 2 weeks ago while building a walkway at home.  See HPI for further details.  He also has an abscess to the left buttock.  No previous skin infections.  Plan will be to get images of the lumbar spine and apply EMLA cream to the abscess prior to incision and drainage.  DIFFERENTIAL DIAGNOSIS  Abscess; lumbar strain; disc disease  ED COURSE  Image of the lumbar spine is reassuring.  No acute findings.  Abscess incision and drainage performed as above.  Abscess was not deep enough to hold iodoform.  He will be placed on Bactrim and given Percocet. ____________________________________________   FINAL CLINICAL IMPRESSION(S) / ED DIAGNOSES  Final diagnoses:  Abscess  Lumbar strain, initial encounter     ED Discharge Orders         Ordered    oxyCODONE-acetaminophen (PERCOCET) 5-325 MG tablet  Every 6 hours PRN     11/11/19 1157    sulfamethoxazole-trimethoprim (BACTRIM DS) 800-160 MG tablet  2 times daily     11/11/19 1157           ZURICH ARRON was evaluated in Emergency Department on 11/11/2019 for the symptoms described in the history of present illness. He was evaluated in the context of the global COVID-19 pandemic, which necessitated consideration that the patient might be at risk for infection with the SARS-CoV-2 virus that causes COVID-19. Institutional protocols and algorithms that pertain to the evaluation of patients at risk for COVID-19 are in a state of rapid change based on information released by regulatory bodies including the CDC and federal and state organizations. These policies and algorithms were followed during the patient's care in the ED.   Note:  This document was prepared using Dragon voice recognition software and may include unintentional dictation errors.   Victorino Dike, FNP 11/11/19 1829    Earleen Newport, MD 11/12/19 [phone]

## 2019-11-11 NOTE — Discharge Instructions (Signed)
Follow up with primary care if not improving over the next 2-3 days.  Return to the ER for symptoms that change or worsen if unable to schedule an appointment.

## 2020-04-30 ENCOUNTER — Telehealth: Payer: Self-pay | Admitting: Internal Medicine

## 2020-04-30 NOTE — Telephone Encounter (Signed)
Pt called back and says he sees a doctor closer to home and just had a visit yesterday with them

## 2020-04-30 NOTE — Telephone Encounter (Signed)
Per vickie pt is overdue for an appt.   I have called and left a message for pt to call back to schedule appt

## 2020-05-06 ENCOUNTER — Encounter: Payer: Self-pay | Admitting: Family Medicine

## 2021-04-20 ENCOUNTER — Encounter: Payer: Self-pay | Admitting: Family Medicine

## 2021-06-22 ENCOUNTER — Ambulatory Visit (INDEPENDENT_AMBULATORY_CARE_PROVIDER_SITE_OTHER): Payer: Medicare Other | Admitting: Gastroenterology

## 2021-06-22 ENCOUNTER — Encounter: Payer: Self-pay | Admitting: Gastroenterology

## 2021-06-22 VITALS — BP 136/96 | HR 123 | Temp 98.1°F | Ht 61.0 in | Wt 108.0 lb

## 2021-06-22 DIAGNOSIS — Z1211 Encounter for screening for malignant neoplasm of colon: Secondary | ICD-10-CM | POA: Diagnosis not present

## 2021-06-22 DIAGNOSIS — R748 Abnormal levels of other serum enzymes: Secondary | ICD-10-CM

## 2021-06-22 DIAGNOSIS — R634 Abnormal weight loss: Secondary | ICD-10-CM | POA: Diagnosis not present

## 2021-06-22 DIAGNOSIS — R112 Nausea with vomiting, unspecified: Secondary | ICD-10-CM | POA: Diagnosis not present

## 2021-06-22 MED ORDER — CLENPIQ 10-3.5-12 MG-GM -GM/160ML PO SOLN: 1.0000 | Freq: Once | ORAL | 0 refills | Status: AC

## 2021-06-22 NOTE — Progress Notes (Signed)
Keith Parker [address]  [place]  [place], [place] [address]  Main: [phone]  Fax: [phone]   Gastroenterology Consultation  Referring Provider:     Marguerita Merles, MD Primary Care Physician:  No primary care provider on file. Reason for Consultation:    weight loss        HPI:    Chief Complaint  Patient presents with   New Patient (Initial Visit)    Pt reports diarrhea, vomiting and weight loss x 1 year   Weight Loss    Keith Parker is a 45 y.o. y/o male referred for consultation & management  by Dr. Rayne Du primary care provider on file..  Patient referred for significant unintentional weight loss over the last year.  Patient presents with her family member who states that patient does not like to go to doctors and it has been a struggle to get him to come to our clinic as well.  Patient reports having diarrhea 4-5 times a day for the last year, and nausea vomiting almost on a daily basis as well.  No hematemesis.  No dysphagia.  No prior EGD.  Describes having a remote colonoscopy over 10 years ago for blood in his stool and states a polyp was found at that time.  Procedure report not available thinks it was done in [facility].  Scan labs under media reviewed and show elevated liver enzymes, and low platelets, but normal hemoglobin  Patient describes daily alcohol use for years.  No known history of cirrhosis.  October 2022 scanned labs Hemoglobin 13.8 Platelets 125  alkaline phosphatase 174 AST 43 ALT 27 Hep C antibody nonreactive  Past Medical History:  Diagnosis Date   Allergy    Arthritis    Depression    Elevated liver enzymes 10/29/2017   GERD (gastroesophageal reflux disease)    Hearing loss, conductive, bilateral 12/31/2017   Hypertension    Polio    Seizures ([place])    last seizure about 7 years ago.    Substance abuse ([place])    Alcohol abuse    Past Surgical History:  Procedure Laterality Date   LEG SURGERY  1988   8  surgeries-hips and legs for walking after getting polio- last one 1988   WRIST SURGERY     right    Prior to Admission medications   Medication Sig Start Date End Date Taking? Authorizing Provider  buprenorphine (BUTRANS) 7.5 MCG/HR 1 patch once a week. 05/30/21  Yes [provider]  Sod Picosulfate-Mag Ox-Cit Acd (CLENPIQ) 10-3.5-12 MG-GM -GM/160ML SOLN Take 1 kit by mouth once for 1 dose. 06/22/21 06/22/21 Yes [name], MD  DULoxetine (CYMBALTA) 30 MG capsule Take 30 mg by mouth at bedtime. Patient not taking: Reported on 06/22/2021 05/30/21   [provider]    Family History  Problem Relation Age of Onset   Cancer Mother        Breast   Arthritis Mother    Alcohol abuse Father    Arthritis Father    Hypertension Father    Cancer Maternal Grandmother        Breast   Hypertension Maternal Grandmother    Hypertension Maternal Grandfather    Hypertension Paternal Grandmother    Hypertension Paternal Grandfather    Drug abuse Maternal Uncle      Social History   Tobacco Use   Smoking status: Every Day    Packs/day: 2.00    Years: 25.00    Pack  years: 50.00    Types: Cigarettes   Smokeless tobacco: Never  Vaping Use   Vaping Use: Never used  Substance Use Topics   Alcohol use: Yes    Alcohol/week: 70.0 standard drinks    Types: 70 Cans of beer per week    Comment: 2 40 ounces per day    Drug use: No    Allergies as of 06/22/2021   (No Known Allergies)    Review of Systems:    All systems reviewed and negative except where noted in HPI.   Physical Exam:  Constitutional: General:   Alert,  Well-developed, well-nourished, pleasant and cooperative in NAD BP (!) 136/96    Pulse (!) 123    Temp 98.1 F (36.7 C) (Oral)    Ht '5\' 1"'  (1.549 m)    Wt 108 lb (49 kg)    BMI 20.41 kg/m   Eyes:  Sclera clear, no icterus.   Conjunctiva pink. PERRLA  Ears:  No scars, lesions or masses, Normal auditory acuity. Nose:  No deformity, discharge, or  lesions. Mouth:  No deformity or lesions, oropharynx pink & moist.  Neck:  Supple; no masses or thyromegaly.  Respiratory: Normal respiratory effort, Normal percussion  Gastrointestinal: Soft, non-tender and non-distended without masses, hepatosplenomegaly or hernias noted.  No guarding or rebound tenderness.     Cardiac: No clubbing or edema.  No cyanosis. Normal posterior tibial pedal pulses noted.  Lymphatic:  No significant cervical or axillary adenopathy.  Psych:  Alert and cooperative. Normal mood and affect.  Musculoskeletal:  Normal gait. Head normocephalic, atraumatic. Symmetrical without gross deformities. 5/5 Upper and Lower extremity strength bilaterally.  Skin: Warm. Intact without significant lesions or rashes. No jaundice.  Neurologic:  Face symmetrical, tongue midline, Normal sensation to touch;  grossly normal neurologically.  Psych:  Alert and oriented x3, Alert and cooperative. Normal mood and affect.   Labs: CBC    Component Value Date/Time   WBC 5.8 02/08/2018 1023   WBC 5.7 07/04/2017 1126   RBC 4.49 02/08/2018 1023   RBC 4.65 07/04/2017 1126   HGB 14.8 02/08/2018 1023   HCT 42.7 02/08/2018 1023   PLT 107 (L) 02/08/2018 1023   MCV 95 02/08/2018 1023   MCH 33.0 02/08/2018 1023   MCH 33.5 (H) 07/04/2017 1126   MCHC 34.7 02/08/2018 1023   MCHC 35.1 07/04/2017 1126   RDW 13.4 02/08/2018 1023   LYMPHSABS 1.5 02/08/2018 1023   MONOABS 0.7 01/14/2015 1205   EOSABS 0.1 02/08/2018 1023   BASOSABS 0.0 02/08/2018 1023   CMP     Component Value Date/Time   NA 132 (L) 02/08/2018 1023   K 4.6 02/08/2018 1023   CL 97 02/08/2018 1023   CO2 21 02/08/2018 1023   GLUCOSE 82 02/08/2018 1023   GLUCOSE 60 (L) 07/04/2017 1126   BUN 4 (L) 02/08/2018 1023   CREATININE 0.64 (L) 02/08/2018 1023   CREATININE 0.73 07/04/2017 1126   CALCIUM 9.1 02/08/2018 1023   PROT 6.4 02/08/2018 1023   ALBUMIN 4.2 02/08/2018 1023   AST 101 (H) 02/08/2018 1023   ALT 71 (H)  02/08/2018 1023   ALKPHOS 95 02/08/2018 1023   BILITOT 0.3 02/08/2018 1023   GFRNONAA 119 02/08/2018 1023   GFRAA 138 02/08/2018 1023    Imaging Studies: No results found.  Assessment and Plan:   Keith Parker is a 45 y.o. y/o male has been referred for unintentional weight loss  Patient is unintentional weight loss along with nausea  vomiting and diarrhea are certainly concerning an EGD and colonoscopy is recommended.  EGD for nausea vomiting, colonoscopy is also due for screening  His elevated liver enzymes are likely due to alcohol use and he was advised to seek assistance from [facility] Anonymous or his PCP to try and cut down and eventually abstain from all alcohol use and risks of cirrhosis and complications from cirrhosis were discussed  His low platelets may be due to underlying cirrhosis.  Will obtain further labs for this, and right upper quadrant ultrasound  EGD would also allow for variceal screening  I have discussed alternative options, risks & benefits,  which include, but are not limited to, bleeding, infection, perforation,respiratory complication & drug reaction.  The patient agrees with this plan & written consent will be obtained.       Dr Keith Parker  Speech recognition software was used to dictate the above note.

## 2021-06-23 LAB — PROTIME-INR
INR: 1 (ref 0.9–1.2)
Prothrombin Time: 11 s (ref 9.1–12.0)

## 2021-06-23 LAB — IRON AND TIBC
Iron Saturation: 84 % (ref 15–55)
Iron: 132 ug/dL (ref 38–169)
Total Iron Binding Capacity: 158 ug/dL — ABNORMAL LOW (ref 250–450)
UIBC: 26 ug/dL — ABNORMAL LOW (ref 111–343)

## 2021-06-23 NOTE — Addendum Note (Signed)
Addended by: Vonda Antigua on: 06/23/2021 08:57 AM   Modules accepted: Orders

## 2021-06-24 ENCOUNTER — Other Ambulatory Visit: Payer: Self-pay

## 2021-06-24 ENCOUNTER — Emergency Department
Admission: EM | Admit: 2021-06-24 | Discharge: 2021-06-24 | Disposition: A | Discharge status: Home / Self Care | Payer: Medicare Other | Attending: Emergency Medicine | Admitting: Emergency Medicine

## 2021-06-24 DIAGNOSIS — R799 Abnormal finding of blood chemistry, unspecified: Secondary | ICD-10-CM | POA: Diagnosis present

## 2021-06-24 DIAGNOSIS — Z5321 Procedure and treatment not carried out due to patient leaving prior to being seen by health care provider: Secondary | ICD-10-CM | POA: Diagnosis not present

## 2021-06-24 LAB — HEPATITIS B CORE ANTIBODY, TOTAL: Hep B Core Total Ab: NEGATIVE

## 2021-06-24 LAB — CBC
HCT: 35 % — ABNORMAL LOW (ref 39.0–52.0)
Hemoglobin: 11.9 g/dL — ABNORMAL LOW (ref 13.0–17.0)
MCH: 35.7 pg — ABNORMAL HIGH (ref 26.0–34.0)
MCHC: 34 g/dL (ref 30.0–36.0)
MCV: 105.1 fL — ABNORMAL HIGH (ref 80.0–100.0)
Platelets: 114 10*3/uL — ABNORMAL LOW (ref 150–400)
RBC: 3.33 MIL/uL — ABNORMAL LOW (ref 4.22–5.81)
RDW: 14.8 % (ref 11.5–15.5)
WBC: 3.9 10*3/uL — ABNORMAL LOW (ref 4.0–10.5)
nRBC: 0 % (ref 0.0–0.2)

## 2021-06-24 LAB — COMPREHENSIVE METABOLIC PANEL
ALT: 164 IU/L — ABNORMAL HIGH (ref 0–44)
ALT: 141 U/L — ABNORMAL HIGH (ref 0–44)
AST: 524 IU/L (ref 0–40)
AST: 326 U/L — ABNORMAL HIGH (ref 15–41)
Albumin/Globulin Ratio: 1.1 — ABNORMAL LOW (ref 1.2–2.2)
Albumin: 3.3 g/dL — ABNORMAL LOW (ref 4.0–5.0)
Albumin: 2.7 g/dL — ABNORMAL LOW (ref 3.5–5.0)
Alkaline Phosphatase: 630 IU/L — ABNORMAL HIGH (ref 44–121)
Alkaline Phosphatase: 465 U/L — ABNORMAL HIGH (ref 38–126)
Anion gap: 7 (ref 5–15)
BUN/Creatinine Ratio: 10 (ref 9–20)
BUN: 4 mg/dL — ABNORMAL LOW (ref 6–24)
BUN: 5 mg/dL — ABNORMAL LOW (ref 6–20)
Bilirubin Total: 1.3 mg/dL — ABNORMAL HIGH (ref 0.0–1.2)
CO2: 20 mmol/L (ref 20–29)
CO2: 27 mmol/L (ref 22–32)
Calcium: 8.2 mg/dL — ABNORMAL LOW (ref 8.7–10.2)
Calcium: 8.4 mg/dL — ABNORMAL LOW (ref 8.9–10.3)
Chloride: 99 mmol/L (ref 96–106)
Chloride: 105 mmol/L (ref 98–111)
Creatinine, Ser: 0.41 mg/dL — ABNORMAL LOW (ref 0.76–1.27)
Creatinine, Ser: 0.48 mg/dL — ABNORMAL LOW (ref 0.61–1.24)
GFR, Estimated: >60 mL/min (ref >60)
Globulin, Total: 2.9 g/dL (ref 1.5–4.5)
Glucose, Bld: 100 mg/dL — ABNORMAL HIGH (ref 70–99)
Glucose: 86 mg/dL (ref 70–99)
Potassium: 3.8 mmol/L (ref 3.5–5.2)
Potassium: 3.8 mmol/L (ref 3.5–5.1)
Sodium: 139 mmol/L (ref 134–144)
Sodium: 139 mmol/L (ref 135–145)
Total Bilirubin: 0.9 mg/dL (ref 0.3–1.2)
Total Protein: 6.2 g/dL (ref 6.0–8.5)
Total Protein: 6.3 g/dL — ABNORMAL LOW (ref 6.5–8.1)
eGFR: 134 mL/min/{1.73_m2} (ref >59)

## 2021-06-24 LAB — ANA: ANA Titer 1: NEGATIVE

## 2021-06-24 LAB — HEPATITIS B SURFACE ANTIBODY,QUALITATIVE: Hep B Surface Ab, Qual: NONREACTIVE

## 2021-06-24 LAB — MITOCHONDRIAL/SMOOTH MUSCLE AB PNL
Mitochondrial Ab: <20 Units (ref 0.0–20.0)
Smooth Muscle Ab: 11 Units (ref 0–19)

## 2021-06-24 LAB — FERRITIN: Ferritin: 2956 ng/mL — ABNORMAL HIGH (ref 30–400)

## 2021-06-24 LAB — IGG: IgG (Immunoglobin G), Serum: 1540 mg/dL (ref 603–1613)

## 2021-06-24 LAB — HEPATITIS A ANTIBODY, TOTAL: hep A Total Ab: NEGATIVE

## 2021-06-24 LAB — HEPATITIS B SURFACE ANTIGEN: Hepatitis B Surface Ag: NEGATIVE

## 2021-06-24 LAB — ANTI-MICROSOMAL ANTIBODY LIVER / KIDNEY: LKM1 Ab: 1.2 Units (ref 0.0–20.0)

## 2021-06-24 LAB — CERULOPLASMIN: Ceruloplasmin: 15.8 mg/dL — ABNORMAL LOW (ref 16.0–31.0)

## 2021-06-24 NOTE — ED Triage Notes (Addendum)
Pt comes with c/o abnormal labs. Pt states his GI doc called and said he needed to get his liver tests completed. Pt denies any new pain.   Pt states he does drink on daily basis. Pt states he hasn't drank today. Pt states he normally drinks 2-40s a day.

## 2021-06-29 ENCOUNTER — Ambulatory Visit
Admission: RE | Admit: 2021-06-29 | Discharge: 2021-06-29 | Disposition: A | Discharge status: Home / Self Care | Payer: Medicare Other | Source: Ambulatory Visit | Attending: Gastroenterology | Admitting: Gastroenterology

## 2021-06-29 ENCOUNTER — Other Ambulatory Visit: Payer: Self-pay

## 2021-06-29 ENCOUNTER — Ambulatory Visit
Admission: RE | Admit: 2021-06-29 | Discharge: 2021-06-29 | Disposition: A | Discharge status: Home / Self Care | Payer: Medicare Other | Source: Ambulatory Visit | Attending: Physical Medicine and Rehabilitation | Admitting: Physical Medicine and Rehabilitation

## 2021-06-29 ENCOUNTER — Other Ambulatory Visit: Payer: Self-pay | Admitting: Gastroenterology

## 2021-06-29 ENCOUNTER — Ambulatory Visit
Admission: RE | Admit: 2021-06-29 | Discharge: 2021-06-29 | Disposition: A | Discharge status: Home / Self Care | Payer: Medicare Other | Source: Ambulatory Visit | Attending: *Deleted | Admitting: *Deleted

## 2021-06-29 ENCOUNTER — Other Ambulatory Visit: Payer: Self-pay | Admitting: Physical Medicine and Rehabilitation

## 2021-06-29 DIAGNOSIS — M545 Low back pain, unspecified: Secondary | ICD-10-CM | POA: Diagnosis present

## 2021-06-29 DIAGNOSIS — R748 Abnormal levels of other serum enzymes: Secondary | ICD-10-CM | POA: Insufficient documentation

## 2021-07-13 ENCOUNTER — Ambulatory Visit: Payer: Medicare Other | Admitting: Anesthesiology

## 2021-07-13 ENCOUNTER — Ambulatory Visit
Admission: RE | Admit: 2021-07-13 | Discharge: 2021-07-13 | Disposition: A | Discharge status: Home / Self Care | Payer: Medicare Other | Attending: Gastroenterology | Admitting: Gastroenterology

## 2021-07-13 ENCOUNTER — Encounter: Payer: Self-pay | Admitting: Gastroenterology

## 2021-07-13 ENCOUNTER — Encounter
Admission: RE | Disposition: A | Discharge status: Home / Self Care | Payer: Self-pay | Source: Home / Self Care | Attending: Gastroenterology

## 2021-07-13 DIAGNOSIS — K229 Disease of esophagus, unspecified: Secondary | ICD-10-CM | POA: Insufficient documentation

## 2021-07-13 DIAGNOSIS — D122 Benign neoplasm of ascending colon: Secondary | ICD-10-CM | POA: Insufficient documentation

## 2021-07-13 DIAGNOSIS — Z1211 Encounter for screening for malignant neoplasm of colon: Secondary | ICD-10-CM | POA: Diagnosis present

## 2021-07-13 DIAGNOSIS — Z8669 Personal history of other diseases of the nervous system and sense organs: Secondary | ICD-10-CM | POA: Insufficient documentation

## 2021-07-13 DIAGNOSIS — F419 Anxiety disorder, unspecified: Secondary | ICD-10-CM | POA: Diagnosis not present

## 2021-07-13 DIAGNOSIS — R634 Abnormal weight loss: Secondary | ICD-10-CM

## 2021-07-13 DIAGNOSIS — D128 Benign neoplasm of rectum: Secondary | ICD-10-CM | POA: Diagnosis not present

## 2021-07-13 DIAGNOSIS — K219 Gastro-esophageal reflux disease without esophagitis: Secondary | ICD-10-CM | POA: Diagnosis not present

## 2021-07-13 DIAGNOSIS — F17210 Nicotine dependence, cigarettes, uncomplicated: Secondary | ICD-10-CM | POA: Diagnosis not present

## 2021-07-13 DIAGNOSIS — K2970 Gastritis, unspecified, without bleeding: Secondary | ICD-10-CM | POA: Diagnosis not present

## 2021-07-13 DIAGNOSIS — R112 Nausea with vomiting, unspecified: Secondary | ICD-10-CM

## 2021-07-13 DIAGNOSIS — D123 Benign neoplasm of transverse colon: Secondary | ICD-10-CM | POA: Diagnosis not present

## 2021-07-13 DIAGNOSIS — F32A Depression, unspecified: Secondary | ICD-10-CM | POA: Diagnosis not present

## 2021-07-13 DIAGNOSIS — I10 Essential (primary) hypertension: Secondary | ICD-10-CM | POA: Diagnosis not present

## 2021-07-13 DIAGNOSIS — D126 Benign neoplasm of colon, unspecified: Secondary | ICD-10-CM | POA: Diagnosis not present

## 2021-07-13 HISTORY — PX: ESOPHAGOGASTRODUODENOSCOPY: SHX5428

## 2021-07-13 HISTORY — PX: COLONOSCOPY WITH PROPOFOL: SHX5780

## 2021-07-13 LAB — KOH PREP

## 2021-07-13 SURGERY — COLONOSCOPY WITH PROPOFOL
Anesthesia: General

## 2021-07-13 MED ORDER — PROPOFOL 500 MG/50ML IV EMUL
INTRAVENOUS | Status: AC
  Filled 2021-07-13: qty 50

## 2021-07-13 MED ORDER — SODIUM CHLORIDE 0.9 % IV SOLN: INTRAVENOUS | Status: DC

## 2021-07-13 MED ORDER — PROPOFOL 500 MG/50ML IV EMUL
INTRAVENOUS | Status: DC | PRN
  Administered 2021-07-13: 175 ug/kg/min via INTRAVENOUS
  Administered 2021-07-13: 70 mg via INTRAVENOUS

## 2021-07-13 NOTE — Anesthesia Postprocedure Evaluation (Signed)
Anesthesia Post Note  Patient: Keith Parker  Procedure(s) Performed: COLONOSCOPY WITH PROPOFOL ESOPHAGOGASTRODUODENOSCOPY (EGD)  Patient location during evaluation: PACU Anesthesia Type: General Level of consciousness: awake and alert, oriented and patient cooperative Pain management: pain level controlled Vital Signs Assessment: post-procedure vital signs reviewed and stable Respiratory status: spontaneous breathing, nonlabored ventilation and respiratory function stable Cardiovascular status: blood pressure returned to baseline and stable Postop Assessment: adequate PO intake Anesthetic complications: no   No notable events documented.   Last Vitals:  Vitals:   07/13/21 0952 07/13/21 0955  BP: (!) 130/92 (!) 134/92  Pulse: 82 82  Resp: (!) 21 15  Temp:    SpO2: 99% 100%    Last Pain:  Vitals:   07/13/21 0935  TempSrc: Temporal  PainSc:                  Darrin Nipper

## 2021-07-13 NOTE — Op Note (Signed)
[facility] Patient Name: Keith Parker Procedure Date: 07/13/2021 8:47 AM MRN: [id number] Account #: [id number] Date of Birth: 09-23-1975 Admit Type: Outpatient Age: 46 Room: [facility] ENDO ROOM 3 Gender: Male Note Status: Finalized Instrument Name: Upper Endoscope [id number] Procedure:             Upper GI endoscopy Indications:           Nausea with vomiting Providers:             [name] MD, MD Medicines:             Monitored Anesthesia Care Complications:         No immediate complications. Procedure:             Pre-Anesthesia Assessment:                        - Prior to the procedure, a History and Physical was                         performed, and patient medications, allergies and                         sensitivities were reviewed. The patient's tolerance                         of previous anesthesia was reviewed.                        - The risks and benefits of the procedure and the                         sedation options and risks were discussed with the                         patient. All questions were answered and informed                         consent was obtained.                        - ASA Grade Assessment: II - A patient with mild                         systemic disease.                        After obtaining informed consent, the endoscope was                         passed under direct vision. Throughout the procedure,                         the patient's blood pressure, pulse, and oxygen                         saturations were monitored continuously. The Endoscope                         was introduced through the mouth, and advanced to the  third part of duodenum. The upper GI endoscopy was                         accomplished with ease. The patient tolerated the                         procedure well. Findings:      The examined duodenum was normal.      Localized mild inflammation  characterized by congestion (edema) and       erythema was found at the incisura and in the gastric antrum. Biopsies       were taken with a cold forceps for histology.      Diffuse, white plaques were found in the entire esophagus. Brushings for       KOH prep were obtained in the entire esophagus.      The cardia and gastric fundus were normal on retroflexion. Impression:            - Normal examined duodenum.                        - Gastritis. Biopsied.                        - Esophageal plaques were found, suspicious for                         candidiasis. Brushings performed. Recommendation:        - Await pathology results.                        - Perform a colonoscopy today. Procedure Code(s):     --- Professional ---                        [phone], Esophagogastroduodenoscopy, flexible,                         transoral; with biopsy, single or multiple Diagnosis Code(s):     --- Professional ---                        K29.70, Gastritis, unspecified, without bleeding                        K22.9, Disease of esophagus, unspecified                        R11.2, Nausea with vomiting, unspecified CPT copyright 2019 American Medical Association. All rights reserved. The codes documented in this report are preliminary and upon coder review may  be revised to meet current compliance requirements. [name], MD [name] MD, MD 07/13/2021 9:06:40 AM This report has been signed electronically. Number of Addenda: 0 Note Initiated On: 07/13/2021 8:47 AM Estimated Blood Loss:  Estimated blood loss: none.      [facility]

## 2021-07-13 NOTE — Anesthesia Preprocedure Evaluation (Signed)
Anesthesia Evaluation  Patient identified by MRN, date of birth, ID band Patient awake    Reviewed: Allergy & Precautions, NPO status , Patient's Chart, lab work & pertinent test results  History of Anesthesia Complications Negative for: history of anesthetic complications  Airway Mallampati: I   Neck ROM: Full    Dental   Missing upper front teeth:   Pulmonary Current Smoker (1 ppd)Patient did not abstain from smoking.,    Pulmonary exam normal breath sounds clear to auscultation       Cardiovascular hypertension, Normal cardiovascular exam Rhythm:Regular Rate:Normal     Neuro/Psych Seizures - (last sz 10 years ago),  PSYCHIATRIC DISORDERS Anxiety Depression Alcohol use disorder, 40 oz beer daily, last intake 07/13/21; HOH    GI/Hepatic GERD  ,  Endo/Other  negative endocrine ROS  Renal/GU negative Renal ROS     Musculoskeletal  (+) Arthritis ,   Abdominal   Peds  Hematology negative hematology ROS (+)   Anesthesia Other Findings   Reproductive/Obstetrics                             Anesthesia Physical Anesthesia Plan  ASA: 3  Anesthesia Plan: General   Post-op Pain Management:    Induction: Intravenous  PONV Risk Score and Plan: 1 and Propofol infusion, TIVA and Treatment may vary due to age or medical condition  Airway Management Planned: Natural Airway  Additional Equipment:   Intra-op Plan:   Post-operative Plan:   Informed Consent: I have reviewed the patients History and Physical, chart, labs and discussed the procedure including the risks, benefits and alternatives for the proposed anesthesia with the patient or authorized representative who has indicated his/her understanding and acceptance.       Plan Discussed with: CRNA  Anesthesia Plan Comments: (LMA/GETA backup discussed.  Patient consented for risks of anesthesia including but not limited to:  - adverse  reactions to medications - damage to eyes, teeth, lips or other oral mucosa - nerve damage due to positioning  - sore throat or hoarseness - damage to heart, brain, nerves, lungs, other parts of body or loss of life  Informed patient about role of CRNA in peri- and intra-operative care.  Patient voiced understanding.)        Anesthesia Quick Evaluation

## 2021-07-13 NOTE — Progress Notes (Signed)
Inform brushing show candida infection of esophagus . Commence on diflucan 200 mg daily for 10 days. Avoid alcohol while on medication

## 2021-07-13 NOTE — Transfer of Care (Signed)
Immediate Anesthesia Transfer of Care Note  Patient: Keith Parker  Procedure(s) Performed: COLONOSCOPY WITH PROPOFOL ESOPHAGOGASTRODUODENOSCOPY (EGD)  Patient Location: PACU  Anesthesia Type:General  Level of Consciousness: awake and alert   Airway & Oxygen Therapy: Patient Spontanous Breathing  Post-op Assessment: Report given to RN and Post -op Vital signs reviewed and stable  Post vital signs: Reviewed and stable  Last Vitals:  Vitals Value Taken Time  BP 97/75 07/13/21 0935  Temp 36 C 07/13/21 0935  Pulse 85 07/13/21 0936  Resp 16 07/13/21 0936  SpO2 97 % 07/13/21 0936  Vitals shown include unvalidated device data.  Last Pain:  Vitals:   07/13/21 0935  TempSrc: Temporal  PainSc:          Complications: No notable events documented.

## 2021-07-13 NOTE — H&P (Signed)
[name], MD [address], [place], [place], [place], [address] [address], [place], [place], [place], [address] Phone: [phone]  Fax: [phone]  Primary Care Physician:  Marguerita Merles, MD   Pre-Procedure History & Physical: HPI:  Keith Parker is a 46 y.o. male is here for an endoscopy and colonoscopy    Past Medical History:  Diagnosis Date   Allergy    Arthritis    Depression    Elevated liver enzymes 10/29/2017   GERD (gastroesophageal reflux disease)    Hearing loss, conductive, bilateral 12/31/2017   Hypertension    Polio    Seizures ([place])    last seizure about 7 years ago.    Substance abuse ([place])    Alcohol abuse    Past Surgical History:  Procedure Laterality Date   LEG SURGERY  1988   8 surgeries-hips and legs for walking after getting polio- last one 1988   WRIST SURGERY     right    Prior to Admission medications   Medication Sig Start Date End Date Taking? Authorizing Provider  buprenorphine (BUTRANS) 7.5 MCG/HR 1 patch once a week. 05/30/21  Yes [provider]  DULoxetine (CYMBALTA) 30 MG capsule Take 30 mg by mouth at bedtime. 05/30/21  Yes [provider]    Allergies as of 06/23/2021   (No Known Allergies)    Family History  Problem Relation Age of Onset   Cancer Mother        Breast   Arthritis Mother    Alcohol abuse Father    Arthritis Father    Hypertension Father    Cancer Maternal Grandmother        Breast   Hypertension Maternal Grandmother    Hypertension Maternal Grandfather    Hypertension Paternal Grandmother    Hypertension Paternal Grandfather    Drug abuse Maternal Uncle     Social History   Socioeconomic History   Marital status: Single    Spouse name: Not on file   Number of children: 2   Years of education: 12   Highest education level: Not on file  Occupational History   Occupation: Disabled    Comment: Post polio syndrome / Depression  Tobacco Use   Smoking status: Every  Day    Packs/day: 2.00    Years: 25.00    Pack years: 50.00    Types: Cigarettes   Smokeless tobacco: Never  Vaping Use   Vaping Use: Never used  Substance and Sexual Activity   Alcohol use: Yes    Alcohol/week: 70.0 standard drinks    Types: 70 Cans of beer per week    Comment: 1 40 ounces per day   Drug use: No   Sexual activity: Not on file  Other Topics Concern   Not on file  Social History Narrative   Fun: Workout   Denies religious beliefs effecting health care.    Social Determinants of Health   Financial Resource Strain: Not on file  Food Insecurity: Not on file  Transportation Needs: Not on file  Physical Activity: Not on file  Stress: Not on file  Social Connections: Not on file  Intimate Partner Violence: Not on file    Review of Systems: See HPI, otherwise negative ROS  Physical Exam: BP 117/85    Pulse (!) 108    Temp (!) 97 F (36.1 C) (Temporal)    Resp 20    Ht 5\' 2"  (1.575 m)  Wt 46.7 kg    SpO2 100%    BMI 18.84 kg/m  General:   Alert,  pleasant and cooperative in NAD Head:  Normocephalic and atraumatic. Neck:  Supple; no masses or thyromegaly. Lungs:  Clear throughout to auscultation, normal respiratory effort.    Heart:  +S1, +S2, Regular rate and rhythm, No edema. Abdomen:  Soft, nontender and nondistended. Normal bowel sounds, without guarding, and without rebound.   Neurologic:  Alert and  oriented x4;  grossly normal neurologically.  Impression/Plan: Keith Parker is here for an endoscopy and colonoscopy  to be performed for  evaluation of nausea, vomiting and colon cancer screening     Risks, benefits, limitations, and alternatives regarding endoscopy have been reviewed with the patient.  Questions have been answered.  All parties agreeable.   [name], MD  07/13/2021, 8:46 AM

## 2021-07-13 NOTE — Op Note (Signed)
[facility] Gastroenterology Patient Name: Keith Parker Procedure Date: 07/13/2021 8:46 AM MRN: [id number] Account #: [id number] Date of Birth: March 19, 1976 Admit Type: Outpatient Age: 46 Room: [facility] ENDO ROOM 3 Gender: Male Note Status: Finalized Instrument Name: Park Meo [id number] Procedure:             Colonoscopy Indications:           Screening for colorectal malignant neoplasm Providers:             [name] MD, MD Medicines:             Monitored Anesthesia Care Complications:         No immediate complications. Procedure:             Pre-Anesthesia Assessment:                        - Prior to the procedure, a History and Physical was                         performed, and patient medications, allergies and                         sensitivities were reviewed. The patient's tolerance                         of previous anesthesia was reviewed.                        - The risks and benefits of the procedure and the                         sedation options and risks were discussed with the                         patient. All questions were answered and informed                         consent was obtained.                        - ASA Grade Assessment: II - A patient with mild                         systemic disease.                        After obtaining informed consent, the colonoscope was                         passed under direct vision. Throughout the procedure,                         the patient's blood pressure, pulse, and oxygen                         saturations were monitored continuously. The                         Colonoscope was introduced through the anus and  advanced to the the cecum, identified by the                         appendiceal orifice. The colonoscopy was performed                         with ease. The patient tolerated the procedure well.                         The quality of the bowel preparation was  fair. Findings:      The perianal and digital rectal examinations were normal.      Two sessile polyps were found in the rectum. The polyps were 10 to 13 mm       in size. Preparations were made for mucosal resection. Saline was       injected to raise the lesion. Snare mucosal resection was performed.       Resection and retrieval were complete. To prevent bleeding after mucosal       resection, three hemostatic clips were successfully placed. There was no       bleeding at the end of the procedure. Borders of polyp marked using NBI       or blue light      Three sessile polyps were found in the transverse colon and ascending       colon. The polyps were 4 to 5 mm in size. These polyps were removed with       a cold snare. Resection and retrieval were complete.      The exam was otherwise without abnormality on direct and retroflexion       views. Impression:            - Preparation of the colon was fair.                        - Two 10 to 13 mm polyps in the rectum, removed with                         mucosal resection. Resected and retrieved. Clips were                         placed.                        - Three 4 to 5 mm polyps in the transverse colon and                         in the ascending colon, removed with a cold snare.                         Resected and retrieved.                        - The examination was otherwise normal on direct and                         retroflexion views.                        - Mucosal resection was performed. Resection and  retrieval were complete. Recommendation:        - Discharge patient to home (with escort).                        - Resume previous diet.                        - Continue present medications.                        - Await pathology results.                        - Repeat colonoscopy in 8 months because the bowel                         preparation was suboptimal. Procedure Code(s):     ---  Professional ---                        [phone], Colonoscopy, flexible; with endoscopic mucosal                         resection                        [id number], 17, Colonoscopy, flexible; with removal of                         tumor(s), polyp(s), or other lesion(s) by snare                         technique Diagnosis Code(s):     --- Professional ---                        Z12.11, Encounter for screening for malignant neoplasm                         of colon                        K62.1, Rectal polyp                        K63.5, Polyp of colon CPT copyright 2019 American Medical Association. All rights reserved. The codes documented in this report are preliminary and upon coder review may  be revised to meet current compliance requirements. [name], MD [name] MD, MD 07/13/2021 9:33:59 AM This report has been signed electronically. Number of Addenda: 0 Note Initiated On: 07/13/2021 8:46 AM Scope Withdrawal Time: 0 hours 18 minutes 30 seconds  Total Procedure Duration: 0 hours 22 minutes 39 seconds  Estimated Blood Loss:  Estimated blood loss: none.      [facility]

## 2021-07-14 ENCOUNTER — Encounter: Payer: Self-pay | Admitting: Gastroenterology

## 2021-07-14 LAB — SURGICAL PATHOLOGY

## 2021-07-15 ENCOUNTER — Telehealth: Payer: Self-pay

## 2021-07-15 MED ORDER — FLUCONAZOLE 200 MG PO TABS: 200.0000 mg | ORAL_TABLET | Freq: Every day | ORAL | 0 refills | Status: DC

## 2021-07-15 NOTE — Telephone Encounter (Signed)
Called patient to let him know the below information. Patient agreed on taking the antibiotics. Patient was also informed to finish all of the antibiotics and not to drink alcohol while on medication. Patient agreed.

## 2021-07-15 NOTE — Telephone Encounter (Signed)
-----   Message from [name], MD sent at 07/13/2021 11:23 AM EST ----- Inform brushing show candida infection of esophagus . Commence on diflucan 200 mg daily for 10 days. Avoid alcohol while on medication

## 2021-07-28 ENCOUNTER — Encounter: Payer: Self-pay | Admitting: Gastroenterology

## 2021-08-30 ENCOUNTER — Other Ambulatory Visit: Payer: Self-pay | Admitting: Family Medicine

## 2021-08-30 DIAGNOSIS — R188 Other ascites: Secondary | ICD-10-CM

## 2021-08-30 DIAGNOSIS — F1020 Alcohol dependence, uncomplicated: Secondary | ICD-10-CM

## 2021-09-05 ENCOUNTER — Other Ambulatory Visit: Payer: Self-pay | Admitting: Family Medicine

## 2021-09-05 DIAGNOSIS — F1020 Alcohol dependence, uncomplicated: Secondary | ICD-10-CM

## 2021-09-05 DIAGNOSIS — R188 Other ascites: Secondary | ICD-10-CM

## 2021-09-05 DIAGNOSIS — R634 Abnormal weight loss: Secondary | ICD-10-CM

## 2021-09-05 DIAGNOSIS — R7989 Other specified abnormal findings of blood chemistry: Secondary | ICD-10-CM

## 2021-09-06 ENCOUNTER — Other Ambulatory Visit: Payer: Self-pay

## 2021-09-06 ENCOUNTER — Ambulatory Visit
Admission: RE | Admit: 2021-09-06 | Discharge: 2021-09-06 | Disposition: A | Discharge status: Home / Self Care | Payer: Medicare Other | Source: Ambulatory Visit | Attending: Family Medicine | Admitting: Family Medicine

## 2021-09-06 DIAGNOSIS — R634 Abnormal weight loss: Secondary | ICD-10-CM | POA: Insufficient documentation

## 2021-09-06 DIAGNOSIS — R7989 Other specified abnormal findings of blood chemistry: Secondary | ICD-10-CM | POA: Insufficient documentation

## 2021-09-06 DIAGNOSIS — R188 Other ascites: Secondary | ICD-10-CM | POA: Diagnosis present

## 2021-09-06 DIAGNOSIS — F1020 Alcohol dependence, uncomplicated: Secondary | ICD-10-CM | POA: Diagnosis present

## 2021-09-07 ENCOUNTER — Other Ambulatory Visit: Payer: Self-pay | Admitting: Family Medicine

## 2021-09-07 ENCOUNTER — Ambulatory Visit
Admission: RE | Admit: 2021-09-07 | Discharge: 2021-09-07 | Disposition: A | Discharge status: Home / Self Care | Payer: Medicare Other | Source: Ambulatory Visit | Attending: Family Medicine | Admitting: Family Medicine

## 2021-09-07 DIAGNOSIS — R634 Abnormal weight loss: Secondary | ICD-10-CM

## 2021-09-19 ENCOUNTER — Ambulatory Visit: Payer: Medicare Other | Admitting: Dermatology

## 2021-09-20 ENCOUNTER — Other Ambulatory Visit: Payer: Self-pay

## 2021-09-20 ENCOUNTER — Emergency Department: Payer: Medicare Other

## 2021-09-20 ENCOUNTER — Encounter: Payer: Self-pay | Admitting: Emergency Medicine

## 2021-09-20 ENCOUNTER — Inpatient Hospital Stay
Admission: EM | Admit: 2021-09-20 | Discharge: 2021-09-22 | DRG: 433 | Disposition: A | Discharge status: Home Health | Payer: Medicare Other | Attending: Internal Medicine | Admitting: Internal Medicine

## 2021-09-20 DIAGNOSIS — K760 Fatty (change of) liver, not elsewhere classified: Secondary | ICD-10-CM | POA: Diagnosis present

## 2021-09-20 DIAGNOSIS — M549 Dorsalgia, unspecified: Secondary | ICD-10-CM | POA: Diagnosis present

## 2021-09-20 DIAGNOSIS — E8770 Fluid overload, unspecified: Secondary | ICD-10-CM | POA: Diagnosis present

## 2021-09-20 DIAGNOSIS — D539 Nutritional anemia, unspecified: Secondary | ICD-10-CM

## 2021-09-20 DIAGNOSIS — R601 Generalized edema: Secondary | ICD-10-CM

## 2021-09-20 DIAGNOSIS — E871 Hypo-osmolality and hyponatremia: Secondary | ICD-10-CM | POA: Diagnosis present

## 2021-09-20 DIAGNOSIS — F32A Depression, unspecified: Secondary | ICD-10-CM | POA: Diagnosis present

## 2021-09-20 DIAGNOSIS — G4700 Insomnia, unspecified: Secondary | ICD-10-CM | POA: Diagnosis present

## 2021-09-20 DIAGNOSIS — Z809 Family history of malignant neoplasm, unspecified: Secondary | ICD-10-CM

## 2021-09-20 DIAGNOSIS — Z811 Family history of alcohol abuse and dependence: Secondary | ICD-10-CM

## 2021-09-20 DIAGNOSIS — K219 Gastro-esophageal reflux disease without esophagitis: Secondary | ICD-10-CM | POA: Diagnosis present

## 2021-09-20 DIAGNOSIS — J302 Other seasonal allergic rhinitis: Secondary | ICD-10-CM | POA: Diagnosis present

## 2021-09-20 DIAGNOSIS — F17210 Nicotine dependence, cigarettes, uncomplicated: Secondary | ICD-10-CM | POA: Diagnosis present

## 2021-09-20 DIAGNOSIS — F1020 Alcohol dependence, uncomplicated: Secondary | ICD-10-CM | POA: Diagnosis present

## 2021-09-20 DIAGNOSIS — F411 Generalized anxiety disorder: Secondary | ICD-10-CM | POA: Diagnosis present

## 2021-09-20 DIAGNOSIS — K7031 Alcoholic cirrhosis of liver with ascites: Principal | ICD-10-CM | POA: Diagnosis present

## 2021-09-20 DIAGNOSIS — G8929 Other chronic pain: Secondary | ICD-10-CM | POA: Diagnosis present

## 2021-09-20 DIAGNOSIS — Z8249 Family history of ischemic heart disease and other diseases of the circulatory system: Secondary | ICD-10-CM

## 2021-09-20 DIAGNOSIS — K7030 Alcoholic cirrhosis of liver without ascites: Secondary | ICD-10-CM

## 2021-09-20 DIAGNOSIS — Z20822 Contact with and (suspected) exposure to covid-19: Secondary | ICD-10-CM | POA: Diagnosis present

## 2021-09-20 DIAGNOSIS — E878 Other disorders of electrolyte and fluid balance, not elsewhere classified: Secondary | ICD-10-CM | POA: Diagnosis present

## 2021-09-20 DIAGNOSIS — N5089 Other specified disorders of the male genital organs: Secondary | ICD-10-CM | POA: Diagnosis present

## 2021-09-20 LAB — CBC
HCT: 37.6 % — ABNORMAL LOW (ref 39.0–52.0)
Hemoglobin: 13 g/dL (ref 13.0–17.0)
MCH: 34.5 pg — ABNORMAL HIGH (ref 26.0–34.0)
MCHC: 34.6 g/dL (ref 30.0–36.0)
MCV: 99.7 fL (ref 80.0–100.0)
Platelets: 163 10*3/uL (ref 150–400)
RBC: 3.77 MIL/uL — ABNORMAL LOW (ref 4.22–5.81)
RDW: 13.4 % (ref 11.5–15.5)
WBC: 9.8 10*3/uL (ref 4.0–10.5)
nRBC: 0 % (ref 0.0–0.2)

## 2021-09-20 LAB — COMPREHENSIVE METABOLIC PANEL
ALT: 41 U/L (ref 0–44)
AST: 82 U/L — ABNORMAL HIGH (ref 15–41)
Albumin: 2.3 g/dL — ABNORMAL LOW (ref 3.5–5.0)
Alkaline Phosphatase: 244 U/L — ABNORMAL HIGH (ref 38–126)
Anion gap: 9 (ref 5–15)
BUN: 5 mg/dL — ABNORMAL LOW (ref 6–20)
CO2: 29 mmol/L (ref 22–32)
Calcium: 8.5 mg/dL — ABNORMAL LOW (ref 8.9–10.3)
Chloride: 85 mmol/L — ABNORMAL LOW (ref 98–111)
Creatinine, Ser: 0.48 mg/dL — ABNORMAL LOW (ref 0.61–1.24)
GFR, Estimated: >60 mL/min (ref >60)
Glucose, Bld: 95 mg/dL (ref 70–99)
Potassium: 3.5 mmol/L (ref 3.5–5.1)
Sodium: 123 mmol/L — ABNORMAL LOW (ref 135–145)
Total Bilirubin: 1.5 mg/dL — ABNORMAL HIGH (ref 0.3–1.2)
Total Protein: 6.2 g/dL — ABNORMAL LOW (ref 6.5–8.1)

## 2021-09-20 LAB — PROTIME-INR
INR: 1.1 (ref 0.8–1.2)
Prothrombin Time: 14.5 seconds (ref 11.4–15.2)

## 2021-09-20 LAB — BRAIN NATRIURETIC PEPTIDE: B Natriuretic Peptide: 67.3 pg/mL (ref 0.0–100.0)

## 2021-09-20 MED ORDER — TRAZODONE HCL 100 MG PO TABS: 100.0000 mg | ORAL_TABLET | Freq: Every day | ORAL | Status: DC

## 2021-09-20 MED ORDER — LORAZEPAM 1 MG PO TABS: 1.0000 mg | ORAL_TABLET | ORAL | Status: DC | PRN

## 2021-09-20 MED ORDER — NICOTINE 21 MG/24HR TD PT24
21.0000 mg | MEDICATED_PATCH | Freq: Every day | TRANSDERMAL | Status: DC
  Administered 2021-09-20 – 2021-09-22 (×3): 21 mg via TRANSDERMAL
  Filled 2021-09-20 (×3): qty 1

## 2021-09-20 MED ORDER — BUPRENORPHINE 7.5 MCG/HR TD PTWK
1.0000 | MEDICATED_PATCH | TRANSDERMAL | Status: DC
  Administered 2021-09-22: 1 via TRANSDERMAL
  Filled 2021-09-20: qty 1

## 2021-09-20 MED ORDER — ENOXAPARIN SODIUM 40 MG/0.4ML IJ SOSY
40.0000 mg | PREFILLED_SYRINGE | INTRAMUSCULAR | Status: DC
  Administered 2021-09-20 – 2021-09-21 (×2): 40 mg via SUBCUTANEOUS
  Filled 2021-09-20 (×2): qty 0.4

## 2021-09-20 MED ORDER — THIAMINE HCL 100 MG PO TABS
100.0000 mg | ORAL_TABLET | Freq: Every day | ORAL | Status: DC
  Administered 2021-09-20 – 2021-09-22 (×3): 100 mg via ORAL
  Filled 2021-09-20 (×3): qty 1

## 2021-09-20 MED ORDER — FOLIC ACID 1 MG PO TABS
1.0000 mg | ORAL_TABLET | Freq: Every day | ORAL | Status: DC
  Administered 2021-09-20 – 2021-09-22 (×3): 1 mg via ORAL
  Filled 2021-09-20 (×3): qty 1

## 2021-09-20 MED ORDER — TRAMADOL HCL 50 MG PO TABS
100.0000 mg | ORAL_TABLET | Freq: Four times a day (QID) | ORAL | Status: DC | PRN
  Administered 2021-09-20: 100 mg via ORAL
  Filled 2021-09-20: qty 2

## 2021-09-20 MED ORDER — ADULT MULTIVITAMIN W/MINERALS CH
1.0000 | ORAL_TABLET | Freq: Every day | ORAL | Status: DC
  Administered 2021-09-20 – 2021-09-22 (×3): 1 via ORAL
  Filled 2021-09-20 (×3): qty 1

## 2021-09-20 MED ORDER — SPIRONOLACTONE 25 MG PO TABS
25.0000 mg | ORAL_TABLET | Freq: Every day | ORAL | Status: DC
  Administered 2021-09-20 – 2021-09-21 (×2): 25 mg via ORAL
  Filled 2021-09-20 (×2): qty 1

## 2021-09-20 MED ORDER — IBUPROFEN 400 MG PO TABS
400.0000 mg | ORAL_TABLET | Freq: Four times a day (QID) | ORAL | Status: DC | PRN
  Administered 2021-09-21: 400 mg via ORAL
  Filled 2021-09-20: qty 1

## 2021-09-20 MED ORDER — FUROSEMIDE 10 MG/ML IJ SOLN
40.0000 mg | Freq: Two times a day (BID) | INTRAMUSCULAR | Status: DC
  Administered 2021-09-21: 40 mg via INTRAVENOUS
  Filled 2021-09-20: qty 4

## 2021-09-20 MED ORDER — TRAZODONE HCL 50 MG PO TABS
200.0000 mg | ORAL_TABLET | Freq: Every day | ORAL | Status: DC
  Administered 2021-09-20 – 2021-09-21 (×2): 200 mg via ORAL
  Filled 2021-09-20 (×2): qty 4

## 2021-09-20 MED ORDER — THIAMINE HCL 100 MG/ML IJ SOLN: 100.0000 mg | Freq: Every day | INTRAMUSCULAR | Status: DC

## 2021-09-20 MED ORDER — SENNOSIDES-DOCUSATE SODIUM 8.6-50 MG PO TABS: 1.0000 | ORAL_TABLET | Freq: Every evening | ORAL | Status: DC | PRN

## 2021-09-20 MED ORDER — POTASSIUM CHLORIDE CRYS ER 20 MEQ PO TBCR
20.0000 meq | EXTENDED_RELEASE_TABLET | Freq: Every day | ORAL | Status: DC
  Administered 2021-09-20 – 2021-09-22 (×3): 20 meq via ORAL
  Filled 2021-09-20 (×3): qty 1

## 2021-09-20 MED ORDER — LORAZEPAM 2 MG/ML IJ SOLN: 1.0000 mg | INTRAMUSCULAR | Status: DC | PRN

## 2021-09-20 MED ORDER — FUROSEMIDE 10 MG/ML IJ SOLN
40.0000 mg | Freq: Once | INTRAMUSCULAR | Status: AC
  Administered 2021-09-20: 40 mg via INTRAVENOUS
  Filled 2021-09-20: qty 4

## 2021-09-20 NOTE — ED Triage Notes (Signed)
Pt to ED via POV with c/o testicle swelling for the last week and half. He reports that it started out in his legs and has moved up to his groin. He was put on a water pill to help with the legs, but it has traveled up to his testicles. It is hard for him to ambulate because of swelling.  ?

## 2021-09-20 NOTE — Assessment & Plan Note (Addendum)
-   Recent diagnosis, appears was started on oral Lasix but has had progressive fluid retention and worsening ascites, leg swelling ?-Continued on Lasix and spironolactone at discharge.  Patient informed will take quite some time for his edema to improve ?-Outpatient follow-up with PCP and GI ?- Continue low-sodium diet ?-Paracentesis performed on 09/21/2021.  4.3 L ascites fluid removed.  Negative for SBP. Cytology negative for malignancy on ascites fluid too  ?- 2D echo shows EF 60-65%, Gr 1 DD ?-Liver Doppler shows patent portal venous system with hepatopetal flow ?- outpatient follow up with GI for: repeat EGD to re-eval for varices and further follow up regarding elevated ferritin/low ceruloplasmin and Hep A/B vaccinations ?

## 2021-09-20 NOTE — ED Provider Notes (Signed)
? ?[facility] ?Provider Note ? ? ? Event Date/Time  ? First MD Initiated Contact with Patient 09/20/21 1459   ?  (approximate) ? ? ?History  ? ?Groin Swelling ? ? ?HPI ? ?Keith Parker is a 46 y.o. male gentleman with a history of polysubstance abuse and alcohol dependence with recent ultrasound of the abdomen with findings suggestive of early cirrhosis presents to the ER for worsening leg swelling that is progressively worsened over the past few weeks now with swelling into his groin causing irritation on his scrotum.  Denies any fevers.  Does feel like his abdomen is more distended.  Denies any history of congestive heart failure.  States he is never been diagnosed with cirrhosis. ?  ? ? ?Physical Exam  ? ?Triage Vital Signs: ?ED Triage Vitals  ?Enc Vitals Group  ?   BP 09/20/21 1351 131/69  ?   Pulse Rate 09/20/21 1351 (!) 111  ?   Resp 09/20/21 1351 20  ?   Temp 09/20/21 1351 98.7 ?F (37.1 ?C)  ?   Temp Source 09/20/21 1351 Oral  ?   SpO2 09/20/21 1351 100 %  ?   Weight 09/20/21 1352 125 lb (56.7 kg)  ?   Height 09/20/21 1352 '5\' 2"'$  (1.575 m)  ?   Head Circumference --   ?   Peak Flow --   ?   Pain Score 09/20/21 1351 10  ?   Pain Loc --   ?   Pain Edu? --   ?   Excl. in [place]? --   ? ? ?Most recent vital signs: ?Vitals:  ? 09/20/21 1351  ?BP: 131/69  ?Pulse: (!) 111  ?Resp: 20  ?Temp: 98.7 ?F (37.1 ?C)  ?SpO2: 100%  ? ? ? ?Constitutional: Alert  ?Eyes: Conjunctivae are normal.  ?Head: Atraumatic. ?Nose: No congestion/rhinnorhea. ?Mouth/Throat: Mucous membranes are moist.   ?Neck: Painless ROM.  ?Cardiovascular:   Good peripheral circulation. No m/g/r ?Respiratory: Normal respiratory effort.  No retractions.  ?Gastrointestinal: Soft and nontender.  Pitting edema.  Significant amount of scrotal edema no blistering does have some findings of skin irritation no cellulitic changes not consistent with Fournier's ?Musculoskeletal: Bilateral 3+ pitting edema the entire leg with some bilateral lower  extremity venous stasis changes. ?Neurologic:  MAE spontaneously. No gross focal neurologic deficits are appreciated.  ?Skin:  Skin is warm, dry and intact. No rash noted. ?Psychiatric: Mood and affect are normal. Speech and behavior are normal. ? ? ? ?ED Results / Procedures / Treatments  ? ?Labs ?(all labs ordered are listed, but only abnormal results are displayed) ?Labs Reviewed  ?COMPREHENSIVE METABOLIC PANEL - Abnormal; Notable for the following components:  ?    Result Value  ? Sodium 123 (*)   ? Chloride 85 (*)   ? BUN 5 (*)   ? Creatinine, Ser 0.48 (*)   ? Calcium 8.5 (*)   ? Total Protein 6.2 (*)   ? Albumin 2.3 (*)   ? AST 82 (*)   ? Alkaline Phosphatase 244 (*)   ? Total Bilirubin 1.5 (*)   ? All other components within normal limits  ?CBC - Abnormal; Notable for the following components:  ? RBC 3.77 (*)   ? HCT 37.6 (*)   ? MCH 34.5 (*)   ? All other components within normal limits  ?PROTIME-INR  ?BRAIN NATRIURETIC PEPTIDE  ?HIV ANTIBODY (ROUTINE TESTING W REFLEX)  ? ? ? ?EKG ? ? ? ? ?RADIOLOGY ?Please  see ED Course for my review and interpretation. ? ?I personally reviewed all radiographic images ordered to evaluate for the above acute complaints and reviewed radiology reports and findings.  These findings were personally discussed with the patient.  Please see medical record for radiology report. ? ? ? ?PROCEDURES: ? ?Critical Care performed: Yes, see critical care procedure note(s) ? ?.Critical Care ?Performed by: Merlyn Lot, MD ?Authorized by: Merlyn Lot, MD  ? ?Critical care provider statement:  ?  Critical care time (minutes):  35 ?  Critical care was necessary to treat or prevent imminent or life-threatening deterioration of the following conditions:  Hepatic failure and metabolic crisis ?  Critical care was time spent personally by me on the following activities:  Ordering and performing treatments and interventions, ordering and review of laboratory studies, ordering and review of  radiographic studies, pulse oximetry, re-evaluation of patient's condition, review of old charts, obtaining history from patient or surrogate, examination of patient, evaluation of patient's response to treatment, discussions with primary provider, discussions with consultants and development of treatment plan with patient or surrogate ? ? ?MEDICATIONS ORDERED IN ED: ?Medications  ?furosemide (LASIX) injection 40 mg (has no administration in time range)  ?enoxaparin (LOVENOX) injection 40 mg (has no administration in time range)  ?ibuprofen (ADVIL) tablet 400 mg (has no administration in time range)  ?senna-docusate (Senokot-S) tablet 1 tablet (has no administration in time range)  ? ? ? ?IMPRESSION / MDM / ASSESSMENT AND PLAN / ED COURSE  ?I reviewed the triage vital signs and the nursing notes. ?             ?               ? ?Differential diagnosis includes, but is not limited to, cirrhosis, anasarca, chf, arf, electroltyte abn, cellulitis, hernia ? ?Patient presenting with significant swelling as described above.  Seems more consistent with probable decompensated cirrhosis.  Patient is still drinking alcohol daily.  No hypoxia mildly tachycardic normotensive no fever no white count.  Blood work sent for above differential.  Exam does not seem consistent with hernia, cellulitis or testicular mass or torsion.  Not consistent with Fournier's. ? ?Clinical Course as of 09/20/21 1617  ?Tue Sep 20, 2021  ?1555 Chest x-ray by my review and interpretation does not show any evidence of CHF or infiltrate. [PR]  ?1603 Patient with evidence of acute hyponatremia hypoalbuminemia.  Presentation consistent with anasarca in the setting of cirrhosis likely secondary to alcohol use.  Given his extent of edema and hypervolemia do feel he will require hospitalization for further medical stabilization.  Will consult hospitalist. [PR]  ?1607 Meld Score 9 [PR]  ?1616 Patient Case discussed in consultation with hospitalist who agreed  admit patient to hospital.  Will give dose of Lasix given his edema.  Patient updated as to plan. [PR]  ?  ?Clinical Course User Index ?[PR] Merlyn Lot, MD  ? ? ? ?FINAL CLINICAL IMPRESSION(S) / ED DIAGNOSES  ? ?Final diagnoses:  ?Anasarca  ?Alcoholic cirrhosis, unspecified whether ascites present ([place])  ?Hyponatremia  ? ? ? ?Rx / DC Orders  ? ?ED Discharge Orders   ? ? None  ? ?  ? ? ? ?Note:  This document was prepared using Dragon voice recognition software and may include unintentional dictation errors. ? ?  ?Merlyn Lot, MD ?09/20/21 1617 ? ?

## 2021-09-20 NOTE — Assessment & Plan Note (Addendum)
Pain control as needed. ?Mobility as tolerated. ?Out of bed in chair ?-Database reviewed, home buprenorphine patch continued.  Changed weekly on Thursdays ?

## 2021-09-20 NOTE — Assessment & Plan Note (Addendum)
Sodium 123 on admission, hypervolemic in the setting of decompensated cirrhosis. ?- continue diuretics  ?-Sodium improved to 127 at discharge ?

## 2021-09-20 NOTE — Assessment & Plan Note (Addendum)
-   Nicotine patch ordered.  Patient reports being down from 3 packs/day to 1 pack/day.  ?-Smoking cessation counseling provided on admission ?

## 2021-09-20 NOTE — ED Notes (Signed)
Pt has +4 pitting edema in bilateral legs, scrotum, buttocks and abdomen  ?

## 2021-09-20 NOTE — H&P (Signed)
?History and Physical  ? ? ?Patient: Keith Parker BDZ:[id number] DOB: [birth year] ?DOA: 09/20/2021 ?DOS: the patient was seen and examined on 09/20/2021 ?PCP: Marguerita Merles, MD  ?Patient coming from: Home ? ?Chief Complaint:  ?Chief Complaint  ?Patient presents with  ? Groin Swelling  ? ?HPI: Keith Parker") is a 45 y.o. male with medical history significant of polysubstance abuse, alcohol dependence but currently consuming 1 drink per day, depression and anxiety with panic, postpolio syndrome and chronic pain who presented to the ED this afternoon with worsening leg and abdominal swelling.  He had a abdominal ultrasound in January after is found to have elevated LFTs, it showed likely cirrhosis.  He reports severe swelling in both legs, and abdomen so distended he is not able to bend over and reach his feet.  Denies any fevers chills or abdominal pain.  Does admit to some bloating and gassiness.  No recent illnesses. ? ?Reports difficulty sleeping which is chronic.  Also reports diarrhea for about the past month no nausea vomiting.  No respiratory symptoms including cough shortness of breath or sore throat or congestion.  No dysuria or urinary frequency.  No unilateral weakness numbness tingling or other neurologic changes.  Reports drinking he is down to only 1 alcoholic beverage per day.  Does admit to history of alcohol withdrawal symptoms including prior seizure.  Continues to smoke but down from 3 to 1 pack/day. ? ? ?Review of Systems: As mentioned in the history of present illness. All other systems reviewed and are negative. ?Past Medical History:  ?Diagnosis Date  ? Allergy   ? Arthritis   ? Depression   ? Elevated liver enzymes 10/29/2017  ? GERD (gastroesophageal reflux disease)   ? Hearing loss, conductive, bilateral 12/31/2017  ? Hypertension   ? Polio   ? Seizures ([place])   ? last seizure about 7 years ago.   ? Substance abuse ([place])   ? Alcohol abuse  ? ?Past Surgical History:  ?Procedure Laterality Date   ? COLONOSCOPY WITH PROPOFOL N/A 07/13/2021  ? Procedure: COLONOSCOPY WITH PROPOFOL;  Surgeon: [name], MD;  Location: [facility] ENDOSCOPY;  Service: Gastroenterology;  Laterality: N/A;  ? ESOPHAGOGASTRODUODENOSCOPY N/A 07/13/2021  ? Procedure: ESOPHAGOGASTRODUODENOSCOPY (EGD);  Surgeon: [name], MD;  Location: [facility] ENDOSCOPY;  Service: Gastroenterology;  Laterality: N/A;  ? LEG SURGERY  1988  ? 8 surgeries-hips and legs for walking after getting polio- last one 1988  ? WRIST SURGERY    ? right  ? ?Social History:  reports that he has been smoking cigarettes. He has a 50.00 pack-year smoking history. He has never used smokeless tobacco. He reports current alcohol use of about 70.0 standard drinks per week. He reports that he does not use drugs. ? ?No Known Allergies ? ?Family History  ?Problem Relation Age of Onset  ? Cancer Mother   ?     Breast  ? Arthritis Mother   ? Alcohol abuse Father   ? Arthritis Father   ? Hypertension Father   ? Cancer Maternal Grandmother   ?     Breast  ? Hypertension Maternal Grandmother   ? Hypertension Maternal Grandfather   ? Hypertension Paternal Grandmother   ? Hypertension Paternal Grandfather   ? Drug abuse Maternal Uncle   ? ? ?Prior to Admission medications   ?Medication Sig Start Date End Date Taking? Authorizing Provider  ?buprenorphine (BUTRANS) 7.5 MCG/HR 1 patch once a week. 05/30/21   [provider]  ?DULoxetine (CYMBALTA)  30 MG capsule Take 30 mg by mouth at bedtime. 05/30/21   [provider]  ?fluconazole (DIFLUCAN) 200 MG tablet Take 1 tablet (200 mg total) by mouth daily. ?Patient not taking: Reported on 09/20/2021 07/15/21   [name], MD  ?furosemide (LASIX) 40 MG tablet Take 80 mg by mouth every morning. 09/12/21   [provider]  ?Potassium Chloride ER 20 MEQ TBCR Take 20 mEq by mouth daily. 08/26/21   [provider]  ?traZODone (DESYREL) 100 MG tablet Take 200 mg by mouth at bedtime. 09/08/21   [provider]   ? ? ?Physical Exam: ?Vitals:  ? 09/20/21 1351 09/20/21 1352  ?BP: 131/69   ?Pulse: (!) 111   ?Resp: 20   ?Temp: 98.7 ?F (37.1 ?C)   ?TempSrc: Oral   ?SpO2: 100%   ?Weight:  56.7 kg  ?Height:  _0  (1.575 m)  ? ?General exam: awake, alert, no acute distress, malnourished ?HEENT:, Clear conjunctiva moist mucus membranes, hearing grossly normal  ?Respiratory system: CTAB, no wheezes, rales or rhonchi, normal respiratory effort. ?Cardiovascular system: normal S1/S2, RRR, 3+ bilateral lower extremity pitting edema ?Gastrointestinal system: Tensely distended abdomen, nontender. ?Central nervous system: A&O x3. no gross focal neurologic deficits, normal speech ?Extremities: Bilateral lower extremities with 3+ pitting edema and symmetric erythema without differential warmth, no edema, normal tone ?Skin: dry, intact, normal temperature, erythematous lower extremities without differential warmth ?Psychiatry: normal mood, congruent affect, judgement and insight appear normal ? ? ?Data Reviewed: ? ?Labs reviewed and notable for metabolic panel with sodium 123, chloride 85, BUN 5, creatinine 0.48, calcium 8.5, albumin 2.3, alk phos 244, AST 82, total bili 1.5.  BNP normal 67.3.  CBC essentially unremarkable.  INR 1.19 PT 14.5 pulse normal. ? ?Chest x-ray negative for any acute cardiopulmonary disease. ? ?Abdominal ultrasound from 09/06/2021: ?1. Mild gallbladder sludge. No sonographic evidence of acute ?cholecystitis. ?2. Moderate to high-grade increased echogenicity and attenuation of ?the liver parenchyma compatible with hepatic steatosis or other ?diffuse hepatocellular disease. Minimally nodular liver contour is ?unchanged, compatible with cirrhosis. ?3. Hyperechoic mass within the right renal midpole is unchanged from ?prior, compatible with an angiomyolipoma. ? ? ? ?Assessment and Plan: ?* Hyponatremia ?Sodium 123 on admission, hypervolemic in the setting of decompensated cirrhosis. ?-- Diuresis as outlined ?-- Follow  BMP ?-- Further evaluation if not improving ? ?Alcoholic cirrhosis of liver with ascites ([place]) ?Recent diagnosis, appears was started on oral Lasix but has had progressive fluid retention and worsening ascites, leg swelling. ?--IV Lasix 40 mg twice daily for now ?-- Start spironolactone 25 mg daily, titrate up as BP allows ?-- GI consulted ?-- Strict I/O's and daily weights ?-- Low-sodium diet with fluid restriction ?-- Echocardiogram  ?-- Paracentesis with fluid studies and culture ?-- No abdominal tenderness or fever, doubt SBP, defer antibiotics and monitor ? ?Hypochloremia ?Chloride 85 on admission.  Monitor ? ?Chronic right hip pain ?Pain control as needed. ?Mobility as tolerated. ?Out of bed in chair ? ?Insomnia ?Trazodone at bedtime ? ?Heavy cigarette smoker ?Nicotine patch ordered.  Patient reports being down from 3 packs/day to 1 pack/day.  Counseled on the importance of cessation. ? ?Generalized anxiety disorder ?Continue home medications pending med history ? ? ? ? ? Advance Care Planning:   Code Status: Full Code  ? ?Consults: Gastroenterology ? ?Family Communication: Patient's roommate at bedside during encounter ? ?Severity of Illness: ?The appropriate patient status for this patient is OBSERVATION. Observation status is judged to be reasonable and necessary  in order to provide the required intensity of service to ensure the patient's safety. The patient's presenting symptoms, physical exam findings, and initial radiographic and laboratory data in the context of their medical condition is felt to place them at decreased risk for further clinical deterioration. Furthermore, it is anticipated that the patient will be medically stable for discharge from the hospital within 2 midnights of admission.  ? ?Author: ?Ezekiel Slocumb, DO ?09/20/2021 4:59 PM ? ?For on call review [email or web address].  ?

## 2021-09-20 NOTE — Assessment & Plan Note (Addendum)
-   Per med rec no longer on Cymbalta ?

## 2021-09-20 NOTE — Assessment & Plan Note (Signed)
Trazodone at bedtime

## 2021-09-20 NOTE — Assessment & Plan Note (Signed)
Chloride 85 on admission.  Monitor ?

## 2021-09-21 ENCOUNTER — Observation Stay (HOSPITAL_COMMUNITY)
Admit: 2021-09-21 | Discharge: 2021-09-21 | Disposition: A | Discharge status: Home / Self Care | Payer: Medicare Other | Attending: Internal Medicine | Admitting: Internal Medicine

## 2021-09-21 ENCOUNTER — Observation Stay: Payer: Medicare Other

## 2021-09-21 DIAGNOSIS — G8929 Other chronic pain: Secondary | ICD-10-CM | POA: Diagnosis present

## 2021-09-21 DIAGNOSIS — Z8249 Family history of ischemic heart disease and other diseases of the circulatory system: Secondary | ICD-10-CM | POA: Diagnosis not present

## 2021-09-21 DIAGNOSIS — D539 Nutritional anemia, unspecified: Secondary | ICD-10-CM

## 2021-09-21 DIAGNOSIS — F1020 Alcohol dependence, uncomplicated: Secondary | ICD-10-CM | POA: Diagnosis present

## 2021-09-21 DIAGNOSIS — R601 Generalized edema: Secondary | ICD-10-CM | POA: Diagnosis present

## 2021-09-21 DIAGNOSIS — I8510 Secondary esophageal varices without bleeding: Secondary | ICD-10-CM | POA: Insufficient documentation

## 2021-09-21 DIAGNOSIS — E871 Hypo-osmolality and hyponatremia: Secondary | ICD-10-CM | POA: Diagnosis present

## 2021-09-21 DIAGNOSIS — E878 Other disorders of electrolyte and fluid balance, not elsewhere classified: Secondary | ICD-10-CM | POA: Diagnosis present

## 2021-09-21 DIAGNOSIS — F17210 Nicotine dependence, cigarettes, uncomplicated: Secondary | ICD-10-CM | POA: Diagnosis present

## 2021-09-21 DIAGNOSIS — N5089 Other specified disorders of the male genital organs: Secondary | ICD-10-CM | POA: Diagnosis present

## 2021-09-21 DIAGNOSIS — K760 Fatty (change of) liver, not elsewhere classified: Secondary | ICD-10-CM | POA: Diagnosis present

## 2021-09-21 DIAGNOSIS — Z20822 Contact with and (suspected) exposure to covid-19: Secondary | ICD-10-CM | POA: Diagnosis present

## 2021-09-21 DIAGNOSIS — E8770 Fluid overload, unspecified: Secondary | ICD-10-CM | POA: Diagnosis present

## 2021-09-21 DIAGNOSIS — K7030 Alcoholic cirrhosis of liver without ascites: Secondary | ICD-10-CM | POA: Diagnosis not present

## 2021-09-21 DIAGNOSIS — F411 Generalized anxiety disorder: Secondary | ICD-10-CM | POA: Diagnosis present

## 2021-09-21 DIAGNOSIS — G4700 Insomnia, unspecified: Secondary | ICD-10-CM | POA: Diagnosis present

## 2021-09-21 DIAGNOSIS — Z811 Family history of alcohol abuse and dependence: Secondary | ICD-10-CM | POA: Diagnosis not present

## 2021-09-21 DIAGNOSIS — J302 Other seasonal allergic rhinitis: Secondary | ICD-10-CM | POA: Diagnosis present

## 2021-09-21 DIAGNOSIS — Z809 Family history of malignant neoplasm, unspecified: Secondary | ICD-10-CM | POA: Diagnosis not present

## 2021-09-21 DIAGNOSIS — K219 Gastro-esophageal reflux disease without esophagitis: Secondary | ICD-10-CM | POA: Diagnosis present

## 2021-09-21 DIAGNOSIS — K7460 Unspecified cirrhosis of liver: Secondary | ICD-10-CM | POA: Insufficient documentation

## 2021-09-21 DIAGNOSIS — F32A Depression, unspecified: Secondary | ICD-10-CM | POA: Diagnosis present

## 2021-09-21 DIAGNOSIS — K7031 Alcoholic cirrhosis of liver with ascites: Secondary | ICD-10-CM | POA: Diagnosis present

## 2021-09-21 LAB — CBC
HCT: 36.7 % — ABNORMAL LOW (ref 39.0–52.0)
Hemoglobin: 12.7 g/dL — ABNORMAL LOW (ref 13.0–17.0)
MCH: 34.4 pg — ABNORMAL HIGH (ref 26.0–34.0)
MCHC: 34.6 g/dL (ref 30.0–36.0)
MCV: 99.5 fL (ref 80.0–100.0)
Platelets: 153 10*3/uL (ref 150–400)
RBC: 3.69 MIL/uL — ABNORMAL LOW (ref 4.22–5.81)
RDW: 13.4 % (ref 11.5–15.5)
WBC: 6.7 10*3/uL (ref 4.0–10.5)
nRBC: 0 % (ref 0.0–0.2)

## 2021-09-21 LAB — ECHOCARDIOGRAM COMPLETE
AR max vel: 2.96 cm2
AV Area VTI: 3.7 cm2
AV Area mean vel: 2.89 cm2
AV Mean grad: 2.5 mmHg
AV Peak grad: 3.8 mmHg
Ao pk vel: 0.97 m/s
Area-P 1/2: 6.65 cm2
Height: 62 in
MV VTI: 3 cm2
S' Lateral: 2 cm
Weight: 2430.35 oz

## 2021-09-21 LAB — COMPREHENSIVE METABOLIC PANEL
ALT: 36 U/L (ref 0–44)
AST: 70 U/L — ABNORMAL HIGH (ref 15–41)
Albumin: 2.2 g/dL — ABNORMAL LOW (ref 3.5–5.0)
Alkaline Phosphatase: 226 U/L — ABNORMAL HIGH (ref 38–126)
Anion gap: 6 (ref 5–15)
BUN: 7 mg/dL (ref 6–20)
CO2: 33 mmol/L — ABNORMAL HIGH (ref 22–32)
Calcium: 8.2 mg/dL — ABNORMAL LOW (ref 8.9–10.3)
Chloride: 90 mmol/L — ABNORMAL LOW (ref 98–111)
Creatinine, Ser: 0.53 mg/dL — ABNORMAL LOW (ref 0.61–1.24)
GFR, Estimated: >60 mL/min (ref >60)
Glucose, Bld: 98 mg/dL (ref 70–99)
Potassium: 3.9 mmol/L (ref 3.5–5.1)
Sodium: 129 mmol/L — ABNORMAL LOW (ref 135–145)
Total Bilirubin: 1.7 mg/dL — ABNORMAL HIGH (ref 0.3–1.2)
Total Protein: 5.8 g/dL — ABNORMAL LOW (ref 6.5–8.1)

## 2021-09-21 LAB — FOLATE: Folate: 15.3 ng/mL (ref >5.9)

## 2021-09-21 LAB — BODY FLUID CELL COUNT WITH DIFFERENTIAL
Eos, Fluid: 0 %
Lymphs, Fluid: 21 %
Monocyte-Macrophage-Serous Fluid: 38 %
Neutrophil Count, Fluid: 41 %
Total Nucleated Cell Count, Fluid: 218 cu mm

## 2021-09-21 LAB — GLUCOSE, CAPILLARY
Glucose-Capillary: 124 mg/dL — ABNORMAL HIGH (ref 70–99)
Glucose-Capillary: 104 mg/dL — ABNORMAL HIGH (ref 70–99)

## 2021-09-21 LAB — VITAMIN B12: Vitamin B-12: 1426 pg/mL — ABNORMAL HIGH (ref 180–914)

## 2021-09-21 LAB — HIV ANTIBODY (ROUTINE TESTING W REFLEX): HIV Screen 4th Generation wRfx: NONREACTIVE

## 2021-09-21 MED ORDER — FUROSEMIDE 40 MG PO TABS
40.0000 mg | ORAL_TABLET | Freq: Every day | ORAL | Status: DC
  Administered 2021-09-22: 40 mg via ORAL
  Filled 2021-09-21: qty 1

## 2021-09-21 MED ORDER — SPIRONOLACTONE 25 MG PO TABS
100.0000 mg | ORAL_TABLET | Freq: Every day | ORAL | Status: DC
  Administered 2021-09-22: 100 mg via ORAL
  Filled 2021-09-21: qty 4

## 2021-09-21 MED ORDER — OXYCODONE HCL 5 MG PO TABS
5.0000 mg | ORAL_TABLET | Freq: Once | ORAL | Status: AC
  Administered 2021-09-21: 5 mg via ORAL
  Filled 2021-09-21: qty 1

## 2021-09-21 NOTE — Procedures (Signed)
PROCEDURE SUMMARY: ? ?Successful ultrasound guided paracentesis from the RUQ. ?Yielded 4.3 L of clear yellow fluid.  ?No immediate complications.  ?The patient tolerated the procedure well.  ? ?Specimen was sent for labs. ? ?EBL <  54m ? ?If the patient eventually requires >/=2 paracenteses in a 30 day period, candidacy for formal evaluation by the [facility] will be assessed. ? ?MTsosie BillingD, PA-C ?09/21/2021, 1:49 PM ? ? ? ? ?

## 2021-09-21 NOTE — Assessment & Plan Note (Addendum)
-   Suspect due to underlying chronic alcohol abuse ?- Check folate and B12 levels (both adequate) ?

## 2021-09-21 NOTE — Progress Notes (Signed)
*  PRELIMINARY RESULTS* ?Echocardiogram ?2D Echocardiogram has been performed. ? ?Enzio Buchler, [place] Side ?09/21/2021, 10:14 AM ?

## 2021-09-21 NOTE — Progress Notes (Signed)
?Progress Note ? ? ? ?Keith Parker   ?MHD:[id number]  ?DOB: 1976-06-26  ?DOA: 09/20/2021     0 ?PCP: Marguerita Merles, MD ? ?Initial CC: swelling, abd distension ? ?Hospital Course: ?Keith Parker is a 46 yo male with PMH ongoing alcohol abuse, polio as a child, remote seizure history (not on chronic medications), depression, HTN who presented to the hospital with generalized swelling and progressively worsening swelling involving his abdomen and scrotum. ?He had a recent ultrasound in January with associated elevated LFTs and concern for newly developed cirrhosis. ?He has been cutting back on his drinking stating that he is down to 1 drink per day.  He also stated that he has been cutting back on his tobacco use as well. ?He was found to have severe edema involving his lower extremities up to his hip as well as significant abdominal distention and scrotal swelling.  He was admitted for further work-up and diuresis.  He underwent paracentesis on 09/21/2021 removing 4.3 L fluid.  Studies were negative for evidence of SBP.  GI was also consulted on admission for further evaluation. ? ?Interval History:  ?Seen in his room this morning after paracentesis.  His abdomen was feeling better after drainage.  He is motivated for quitting drinking he says and also cutting back further on smoking.  He understands he may need serial paracenteses also. ? ?Assessment and Plan: ?* Alcoholic cirrhosis of liver with ascites ([place]) ?- Recent diagnosis, appears was started on oral Lasix but has had progressive fluid retention and worsening ascites, leg swelling ?-Continue IV Lasix and spironolactone; will adjust ratio prior to discharge ?- GI consulted, follow-up recommendations, appreciate assistance ?- Continue low-sodium diet ?-Paracentesis performed on 09/21/2021.  4.3 L ascites fluid removed.  Negative for SBP ?-He denies any signs of confusion/lethargy outpatient.  Stools are already almost diarrheal in nature.  No concern for encephalopathy  at this time. ?-Follow-up echo results as well ?- followup liver doppler  ?- strict I&O ?- outpatient follow up with GI for: repeat EGD to re-eval for varices and further follow up regarding elevated ferritin/low ceruloplasmin and Hep A/B vaccinations ? ?Hyponatremia ?Sodium 123 on admission, hypervolemic in the setting of decompensated cirrhosis. ?- continue diuretics  ? ?Macrocytic anemia ?- Suspect due to underlying chronic alcohol abuse ?- Check folate and B12 levels ? ?Hypochloremia ?Chloride 85 on admission.  Monitor ? ?Chronic right hip pain ?Pain control as needed. ?Mobility as tolerated. ?Out of bed in chair ?-Database reviewed, home buprenorphine patch continued.  Changed weekly on Thursdays ? ?Insomnia ?Trazodone at bedtime ? ?Heavy cigarette smoker ?- Nicotine patch ordered.  Patient reports being down from 3 packs/day to 1 pack/day.  ?-Smoking cessation counseling provided on admission ? ?Generalized anxiety disorder ?- Per med rec no longer on Cymbalta ? ? ? ?Old records reviewed in assessment of this patient ? ?Antimicrobials: ? ? ?DVT prophylaxis:  ?enoxaparin (LOVENOX) injection 40 mg Start: 09/20/21 2200 ? ? ?Code Status:   Code Status: Full Code ? ?Disposition Plan: Home in 1 to 2 days ?Status is: Inpatient ? ?Objective: ?Blood pressure 94/71, pulse 100, temperature 97.6 ?F (36.4 ?C), resp. rate 18, height '5\' 2"'$  (1.575 m), weight 68.9 kg, SpO2 100 %.  ?Examination:  ?Physical Exam ?Constitutional:   ?   General: He is not in acute distress. ?   Appearance: Normal appearance.  ?HENT:  ?   Head: Normocephalic and atraumatic.  ?   Mouth/Throat:  ?   Mouth: Mucous membranes are moist.  ?  Eyes:  ?   Extraocular Movements: Extraocular movements intact.  ?Cardiovascular:  ?   Rate and Rhythm: Normal rate and regular rhythm.  ?   Heart sounds: Normal heart sounds.  ?Pulmonary:  ?   Effort: Pulmonary effort is normal. No respiratory distress.  ?   Breath sounds: Normal breath sounds. No wheezing.   ?Abdominal:  ?   General: Bowel sounds are normal. There is distension.  ?   Palpations: Abdomen is soft.  ?   Tenderness: There is no abdominal tenderness.  ?   Comments: Mild distention with no tenderness to palpation  ?Musculoskeletal:     ?   General: Normal range of motion.  ?   Cervical back: Normal range of motion and neck supple.  ?   Comments: 2-3+ bilateral lower extremity pitting edema up to hips.  Scrotal swelling appreciated approximately grapefruit sized  ?Skin: ?   Comments: Chronic stasis changes noted in bilateral lower extremities from swelling  ?Neurological:  ?   General: No focal deficit present.  ?   Mental Status: He is alert.  ?   Comments: No asterixis or confusion.  AOx4  ?Psychiatric:     ?   Mood and Affect: Mood normal.     ?   Behavior: Behavior normal.  ?  ? ?Consultants:  ?GI ? ?Procedures:  ?Paracentesis, 09/21/2021 ? ?Data Reviewed: ?Results for orders placed or performed during the hospital encounter of 09/20/21 (from the past 24 hour(s))  ?Protime-INR     Status: None  ? Collection Time: 09/20/21  3:20 PM  ?Result Value Ref Range  ? Prothrombin Time 14.5 11.4 - 15.2 seconds  ? INR 1.1 0.8 - 1.2  ?Brain natriuretic peptide     Status: None  ? Collection Time: 09/20/21  3:20 PM  ?Result Value Ref Range  ? B Natriuretic Peptide 67.3 0.0 - 100.0 pg/mL  ?Comprehensive metabolic panel     Status: Abnormal  ? Collection Time: 09/20/21  3:20 PM  ?Result Value Ref Range  ? Sodium 123 (L) 135 - 145 mmol/L  ? Potassium 3.5 3.5 - 5.1 mmol/L  ? Chloride 85 (L) 98 - 111 mmol/L  ? CO2 29 22 - 32 mmol/L  ? Glucose, Bld 95 70 - 99 mg/dL  ? BUN 5 (L) 6 - 20 mg/dL  ? Creatinine, Ser 0.48 (L) 0.61 - 1.24 mg/dL  ? Calcium 8.5 (L) 8.9 - 10.3 mg/dL  ? Total Protein 6.2 (L) 6.5 - 8.1 g/dL  ? Albumin 2.3 (L) 3.5 - 5.0 g/dL  ? AST 82 (H) 15 - 41 U/L  ? ALT 41 0 - 44 U/L  ? Alkaline Phosphatase 244 (H) 38 - 126 U/L  ? Total Bilirubin 1.5 (H) 0.3 - 1.2 mg/dL  ? GFR, Estimated >60 >60 mL/min  ? Anion gap 9 5  - 15  ?CBC     Status: Abnormal  ? Collection Time: 09/20/21  3:20 PM  ?Result Value Ref Range  ? WBC 9.8 4.0 - 10.5 K/uL  ? RBC 3.77 (L) 4.22 - 5.81 MIL/uL  ? Hemoglobin 13.0 13.0 - 17.0 g/dL  ? HCT 37.6 (L) 39.0 - 52.0 %  ? MCV 99.7 80.0 - 100.0 fL  ? MCH 34.5 (H) 26.0 - 34.0 pg  ? MCHC 34.6 30.0 - 36.0 g/dL  ? RDW 13.4 11.5 - 15.5 %  ? Platelets 163 150 - 400 K/uL  ? nRBC 0.0 0.0 - 0.2 %  ?HIV Antibody (routine testing w  rflx)     Status: None  ? Collection Time: 09/20/21  7:52 PM  ?Result Value Ref Range  ? HIV Screen 4th Generation wRfx Non Reactive Non Reactive  ?Comprehensive metabolic panel     Status: Abnormal  ? Collection Time: 09/21/21  5:40 AM  ?Result Value Ref Range  ? Sodium 129 (L) 135 - 145 mmol/L  ? Potassium 3.9 3.5 - 5.1 mmol/L  ? Chloride 90 (L) 98 - 111 mmol/L  ? CO2 33 (H) 22 - 32 mmol/L  ? Glucose, Bld 98 70 - 99 mg/dL  ? BUN 7 6 - 20 mg/dL  ? Creatinine, Ser 0.53 (L) 0.61 - 1.24 mg/dL  ? Calcium 8.2 (L) 8.9 - 10.3 mg/dL  ? Total Protein 5.8 (L) 6.5 - 8.1 g/dL  ? Albumin 2.2 (L) 3.5 - 5.0 g/dL  ? AST 70 (H) 15 - 41 U/L  ? ALT 36 0 - 44 U/L  ? Alkaline Phosphatase 226 (H) 38 - 126 U/L  ? Total Bilirubin 1.7 (H) 0.3 - 1.2 mg/dL  ? GFR, Estimated >60 >60 mL/min  ? Anion gap 6 5 - 15  ?CBC     Status: Abnormal  ? Collection Time: 09/21/21  5:40 AM  ?Result Value Ref Range  ? WBC 6.7 4.0 - 10.5 K/uL  ? RBC 3.69 (L) 4.22 - 5.81 MIL/uL  ? Hemoglobin 12.7 (L) 13.0 - 17.0 g/dL  ? HCT 36.7 (L) 39.0 - 52.0 %  ? MCV 99.5 80.0 - 100.0 fL  ? MCH 34.4 (H) 26.0 - 34.0 pg  ? MCHC 34.6 30.0 - 36.0 g/dL  ? RDW 13.4 11.5 - 15.5 %  ? Platelets 153 150 - 400 K/uL  ? nRBC 0.0 0.0 - 0.2 %  ?Glucose, capillary     Status: Abnormal  ? Collection Time: 09/21/21  8:41 AM  ?Result Value Ref Range  ? Glucose-Capillary 124 (H) 70 - 99 mg/dL  ?Body fluid cell count with differential     Status: Abnormal  ? Collection Time: 09/21/21 11:20 AM  ?Result Value Ref Range  ? Fluid Type-FCT PERITONEAL   ? Color, Fluid YELLOW  YELLOW  ? Appearance, Fluid HAZY (A) CLEAR  ? Total Nucleated Cell Count, Fluid 218 cu mm  ? Neutrophil Count, Fluid 41 %  ? Lymphs, Fluid 21 %  ? Monocyte-Macrophage-Serous Fluid 38 %  ? Eos, Fluid 0 %  ?Glucose,

## 2021-09-21 NOTE — Progress Notes (Signed)
?   09/21/21 0900  ?Clinical Encounter Type  ?Visited With Patient  ?Visit Type Initial  ?Referral From Other (Comment) ?(Spiritual Consult for Advance Directive)  ? ?Chaplain explained and provided paperwork for Advance Directive ?

## 2021-09-21 NOTE — Consult Note (Signed)
?  ?[name] , MD ?[address] [place], [place], [place], [place], [address] ?[address] [place], [place], [place], [address] ?Phone: [phone]  ?Fax: [phone] ? Consultation ? ?Referring Provider:  Dr Arbutus Ped ?Primary Care Physician:  Marguerita Merles, MD ?Primary Gastroenterologist:  [facility]          ?Reason for Consultation:    Ascites  ? ?Date of Admission:  09/20/2021 ?Date of Consultation:  09/21/2021 ?       ? HPI:   ?Keith Parker is a 46 y.o. male who has previously been seen by Dr. Bonna Gains back in December 2022 for weight loss and diarrhea in addition was found to have elevated liver enzymes.  At that point of time the abnormal liver enzymes were attributed to excess alcohol use he was advised to cut down.  Cirrhosis of suspected.  He underwent an upper endoscopy on 07/13/2021 that showed white plaques in the entire esophagus no esophageal varices were seen.  He also underwent a colonoscopy on the same day and 2 polyps were found in the rectum that were resected.  Bowel prep was suboptimal brushings from the esophagus showed Candida.  And the polyps were adenomas.  He has history of polysubstance abuse chronic pain. ? ?Found to have low ceruloplasmin and high ferritin which requires further evaluation.  Iron percentage saturation was 84%.  He did not follow-up with hemochromatosis testing.  Autoimmune screen was negative and not immune to hepatitis B or a.  Alkaline phosphatase was 630 INR is 1.0.  Negative hepatitis C testing. ? ?06/29/2021 right upper quadrant ultrasound showed hepatic steatosis ?09/07/2021 ultrasound abdomen showed moderate to high degree 8 echogenicity compatible with hepatic steatosis nodular liver contour compatible with cirrhosis mild gallbladder sludge Common bile duct size is 4 mm. ? ? ?He presented emergency room with swelling of the groin.  Continues to smoke and consume 1 alcoholic beverage per day.  History of withdrawal symptoms.  Found to have ascites on admission.   Commenced on Aldactone and Lasix.He says his abdomen started swelling up recently , has been taking 2 goody powders daily for a while. Denies any abdominal pain. No fever . No other complaints ? ?09/20/2021: Hemoglobin 13 g MCV 99.7, creatinine 0.48 alkaline phosphatase 244 total bilirubin 1.5 AST 82 ALT 41 albumin 2.3 INR 1.1. ? ? ?Underwent abdominal paracentesis with cell count pending no fluid albumin has been sent. ?Past Medical History:  ?Diagnosis Date  ? Allergy   ? Arthritis   ? Depression   ? Elevated liver enzymes 10/29/2017  ? GERD (gastroesophageal reflux disease)   ? Hearing loss, conductive, bilateral 12/31/2017  ? Hypertension   ? Polio   ? Seizures ([place])   ? last seizure about 7 years ago.   ? Substance abuse ([place])   ? Alcohol abuse  ? ? ?Past Surgical History:  ?Procedure Laterality Date  ? COLONOSCOPY WITH PROPOFOL N/A 07/13/2021  ? Procedure: COLONOSCOPY WITH PROPOFOL;  Surgeon: [name], MD;  Location: [facility] ENDOSCOPY;  Service: Gastroenterology;  Laterality: N/A;  ? ESOPHAGOGASTRODUODENOSCOPY N/A 07/13/2021  ? Procedure: ESOPHAGOGASTRODUODENOSCOPY (EGD);  Surgeon: [name], MD;  Location: [facility] ENDOSCOPY;  Service: Gastroenterology;  Laterality: N/A;  ? LEG SURGERY  1988  ? 8 surgeries-hips and legs for walking after getting polio- last one 1988  ? WRIST SURGERY    ? right  ? ? ?Prior to Admission medications   ?Medication Sig Start Date End Date Taking? Authorizing Provider  ?furosemide (LASIX) 40 MG  tablet Take 80 mg by mouth every morning. 09/12/21  Yes [provider]  ?Potassium Chloride ER 20 MEQ TBCR Take 20 mEq by mouth daily. 08/26/21  Yes [provider]  ?traZODone (DESYREL) 100 MG tablet Take 200 mg by mouth at bedtime. 09/08/21  Yes [provider]  ?buprenorphine (BUTRANS) 7.5 MCG/HR 1 patch once a week. 05/30/21   [provider]  ?DULoxetine (CYMBALTA) 30 MG capsule Take 30 mg by mouth at bedtime. ?Patient not taking: Reported on 09/20/2021 05/30/21    [provider]  ?fluconazole (DIFLUCAN) 200 MG tablet Take 1 tablet (200 mg total) by mouth daily. ?Patient not taking: Reported on 09/20/2021 07/15/21   [name], MD  ? ? ?Family History  ?Problem Relation Age of Onset  ? Cancer Mother   ?     Breast  ? Arthritis Mother   ? Alcohol abuse Father   ? Arthritis Father   ? Hypertension Father   ? Cancer Maternal Grandmother   ?     Breast  ? Hypertension Maternal Grandmother   ? Hypertension Maternal Grandfather   ? Hypertension Paternal Grandmother   ? Hypertension Paternal Grandfather   ? Drug abuse Maternal Uncle   ?  ? ?Social History  ? ?Tobacco Use  ? Smoking status: Every Day  ?  Packs/day: 2.00  ?  Years: 25.00  ?  Pack years: 50.00  ?  Types: Cigarettes  ? Smokeless tobacco: Never  ?Vaping Use  ? Vaping Use: Never used  ?Substance Use Topics  ? Alcohol use: Yes  ?  Alcohol/week: 70.0 standard drinks  ?  Types: 70 Cans of beer per week  ?  Comment: 1 40 ounces per day  ? Drug use: No  ? ? ?Allergies as of 09/20/2021  ? (No Known Allergies)  ? ? ?Review of Systems:    ?All systems reviewed and negative except where noted in HPI. ? ? Physical Exam:  ?Vital signs in last 24 hours: ?Temp:  [97.5 ?F (36.4 ?C)-98.6 ?F (37 ?C)] 97.6 ?F (36.4 ?C) (03/29 1156) ?Pulse Rate:  [96-108] 100 (03/29 1156) ?Resp:  [18-20] 18 (03/29 1156) ?BP: (90-124)/(67-96) 94/71 (03/29 1156) ?SpO2:  [90 %-100 %] 100 % (03/29 1156) ?Weight:  [68.9 kg] 68.9 kg (03/29 0500) ?  ?General:  thin and cachectic ?Head:  Normocephalic and atraumatic. ?Eyes:   No icterus.   Conjunctiva pink. PERRLA. ?Ears:  Normal auditory acuity. ?Lungs: Respirations even and unlabored. Lungs clear to auscultation bilaterally.   No wheezes, crackles, or rhonchi.  ?Heart:  Regular rate and rhythm;  Without murmur, clicks, rubs or gallops ?Abdomen:  Soft, nondistended, nontender. Normal bowel sounds. No appreciable masses or hepatomegaly.  No rebound or guarding.  ?Neurologic:  Alert and oriented x3;   grossly normal neurologically. ?Psych:  Alert and cooperative. Normal affect. ? ?LAB RESULTS: ?Recent Labs  ?  09/20/21 ?1520 09/21/21 ?0540  ?WBC 9.8 6.7  ?HGB 13.0 12.7*  ?HCT 37.6* 36.7*  ?PLT 163 153  ? ?BMET ?Recent Labs  ?  09/20/21 ?1520 09/21/21 ?0540  ?NA 123* 129*  ?K 3.5 3.9  ?CL 85* 90*  ?CO2 29 33*  ?GLUCOSE 95 98  ?BUN 5* 7  ?CREATININE 0.48* 0.53*  ?CALCIUM 8.5* 8.2*  ? ?LFT ?Recent Labs  ?  09/21/21 ?0540  ?PROT 5.8*  ?ALBUMIN 2.2*  ?AST 70*  ?ALT 36  ?ALKPHOS 226*  ?BILITOT 1.7*  ? ?PT/INR ?Recent Labs  ?  09/20/21 ?1520  ?LABPROT 14.5  ?  INR 1.1  ? ? ?STUDIES: ?DG Chest Portable 1 View ? ?Result Date: 09/20/2021 ?CLINICAL DATA:  Edema.  Body swelling. EXAM: PORTABLE CHEST 1 VIEW COMPARISON:  Two-view chest x-ray 09/07/2021 FINDINGS: The heart size and mediastinal contours are within normal limits. Both lungs are clear. The visualized skeletal structures are unremarkable. IMPRESSION: No active disease. Electronically Signed   By: San Morelle M.D.   On: 09/20/2021 15:19   ? ? ? Impression / Plan:  ? ?TONG PIECZYNSKI is a 46 y.o. y/o male with a history of chronic liver disease attributed to chronic alcohol abuse likely has cirrhosis presents to the emergency room with swelling of the groin was found to have large volume ascites that has been drained yesterday labs have been sent to evaluate for SBP.  He has never had his SAAG gradient calculated, I do not see if a ascites fluid albumin that has been sent which would help to calculate the gradient.  Assuming that this is from portal hypertension would recommend the following. ? ?Plan ?1.  Low-salt diet ?2.  Limit use of narcotics as it can cause hepatic encephalopathy. ?3.  In terms of management of ascites I would stop all NSAIDs including ibuprofen which has been given yesterday as it can worsen ascites and or sometimes lead to hepatorenal syndrome. He has been taking goodys daily/  In addition I would increase the dose of Aldactone to 100 mg a  day and keep him on 40 mg of Lasix once a day.  The primary mechanism for fluid retention is hyperaldosteronism which responds better to Aldactone.  Follow-up fluid cell count to rule out SBP. ?4.  As an outp

## 2021-09-21 NOTE — Hospital Course (Addendum)
Keith Parker is a 46 yo male with PMH ongoing alcohol abuse, polio as a child, remote seizure history (not on chronic medications), depression, HTN who presented to the hospital with generalized swelling and progressively worsening swelling involving his abdomen and scrotum. ?He had a recent ultrasound in January with associated elevated LFTs and concern for newly developed cirrhosis. ?He has been cutting back on his drinking stating that he is down to 1 drink per day.  He also stated that he has been cutting back on his tobacco use as well. ?He was found to have severe edema involving his lower extremities up to his hip as well as significant abdominal distention and scrotal swelling.  He was admitted for further work-up and diuresis.  He underwent paracentesis on 09/21/2021 removing 4.3 L fluid.  Studies were negative for evidence of SBP.  GI was also consulted on admission for further evaluation. ?See below for further problem-based plan. ?

## 2021-09-22 ENCOUNTER — Inpatient Hospital Stay: Payer: Medicare Other

## 2021-09-22 ENCOUNTER — Other Ambulatory Visit (HOSPITAL_COMMUNITY): Payer: Self-pay

## 2021-09-22 LAB — CBC WITH DIFFERENTIAL/PLATELET
Abs Immature Granulocytes: 0.02 10*3/uL (ref 0.00–0.07)
Basophils Absolute: 0 10*3/uL (ref 0.0–0.1)
Basophils Relative: 1 %
Eosinophils Absolute: 0 10*3/uL (ref 0.0–0.5)
Eosinophils Relative: 1 %
HCT: 34.7 % — ABNORMAL LOW (ref 39.0–52.0)
Hemoglobin: 12 g/dL — ABNORMAL LOW (ref 13.0–17.0)
Immature Granulocytes: 0 %
Lymphocytes Relative: 28 %
Lymphs Abs: 2 10*3/uL (ref 0.7–4.0)
MCH: 34.6 pg — ABNORMAL HIGH (ref 26.0–34.0)
MCHC: 34.6 g/dL (ref 30.0–36.0)
MCV: 100 fL (ref 80.0–100.0)
Monocytes Absolute: 1 10*3/uL (ref 0.1–1.0)
Monocytes Relative: 13 %
Neutro Abs: 4.2 10*3/uL (ref 1.7–7.7)
Neutrophils Relative %: 57 %
Platelets: 142 10*3/uL — ABNORMAL LOW (ref 150–400)
RBC: 3.47 MIL/uL — ABNORMAL LOW (ref 4.22–5.81)
RDW: 13.3 % (ref 11.5–15.5)
WBC: 7.2 10*3/uL (ref 4.0–10.5)
nRBC: 0 % (ref 0.0–0.2)

## 2021-09-22 LAB — COMPREHENSIVE METABOLIC PANEL
ALT: 32 U/L (ref 0–44)
AST: 59 U/L — ABNORMAL HIGH (ref 15–41)
Albumin: 1.9 g/dL — ABNORMAL LOW (ref 3.5–5.0)
Alkaline Phosphatase: 191 U/L — ABNORMAL HIGH (ref 38–126)
Anion gap: 5 (ref 5–15)
BUN: 9 mg/dL (ref 6–20)
CO2: 31 mmol/L (ref 22–32)
Calcium: 7.8 mg/dL — ABNORMAL LOW (ref 8.9–10.3)
Chloride: 91 mmol/L — ABNORMAL LOW (ref 98–111)
Creatinine, Ser: 0.42 mg/dL — ABNORMAL LOW (ref 0.61–1.24)
GFR, Estimated: >60 mL/min (ref >60)
Glucose, Bld: 84 mg/dL (ref 70–99)
Potassium: 3.8 mmol/L (ref 3.5–5.1)
Sodium: 127 mmol/L — ABNORMAL LOW (ref 135–145)
Total Bilirubin: 1.4 mg/dL — ABNORMAL HIGH (ref 0.3–1.2)
Total Protein: 5.2 g/dL — ABNORMAL LOW (ref 6.5–8.1)

## 2021-09-22 LAB — MAGNESIUM: Magnesium: 1.8 mg/dL (ref 1.7–2.4)

## 2021-09-22 LAB — CYTOLOGY - NON PAP

## 2021-09-22 LAB — GLUCOSE, CAPILLARY: Glucose-Capillary: 88 mg/dL (ref 70–99)

## 2021-09-22 MED ORDER — SPIRONOLACTONE 100 MG PO TABS: 100.0000 mg | ORAL_TABLET | Freq: Every day | ORAL | 3 refills | Status: DC

## 2021-09-22 MED ORDER — FUROSEMIDE 40 MG PO TABS: 40.0000 mg | ORAL_TABLET | Freq: Every day | ORAL | 3 refills | Status: DC

## 2021-09-22 NOTE — Discharge Instructions (Signed)
Keith Parker out patient Physical Therapy; rolling walker given to pt at discharge ?

## 2021-09-22 NOTE — TOC Progression Note (Signed)
Transition of Care (TOC) - Progression Note  ? ? ?Patient Details  ?Name: Keith Parker ?MRN: [id number] ?Date of Birth: [birth year] ? ?Transition of Care (TOC) CM/SW Contact  ?Mackensie Pilson A Brynn Mulgrew, LCSW ?Phone Number: ?09/22/2021, 3:42 PM ? ?Clinical Narrative:  outpatient referral signed and faxed to [facility] (per pt's request). [employer] reminded pt is ready for dc.   ? ? ? ?Expected Discharge Plan: Home/Self Care ?Barriers to Discharge: No Barriers Identified ? ?Expected Discharge Plan and Services ?Expected Discharge Plan: Home/Self Care ?In-house Referral: NA ?  ?Post Acute Care Choice: NA ?Living arrangements for the past 2 months: [place] ?Expected Discharge Date: 09/22/21               ?DME Arranged: Gilford Rile ?DME Agency: [employer] ?Date DME Agency Contacted: 09/22/21 ?Time DME Agency Contacted: [phone] ?Representative spoke with at DME Agency: rhonda ?  ?  ?  ?  ?  ? ? ?Social Determinants of Health (SDOH) Interventions ?  ? ?Readmission Risk Interventions ?   ? View : No data to display.  ?  ?  ?  ? ? ?

## 2021-09-22 NOTE — TOC Initial Note (Signed)
Transition of Care (TOC) - Initial/Assessment Note  ? ? ?Patient Details  ?Name: Keith Parker ?MRN: [id number] ?Date of Birth: 08-Aug-1975 ? ?Transition of Care (TOC) CM/SW Contact:    ?Ytzel Gubler A Lux Skilton, LCSW ?Phone Number: ?09/22/2021, 3:32 PM ? ?Clinical Narrative:     CSW spoke with pt at bedside. Pt is agreeable to [facility] however, CSW has reached out to several [facility] agencies and no one can accept or staff a [place]. Pt is understanding and states he is agreeable to outpatient therapy. Pt states his roommate takes him to his appointments and he can take him to his therapy appointments.  CSW ordered a walker at 2:34. Pt is aware walker will be delivered at bedside . ? ?Expected Discharge Plan: Home/Self Care ?Barriers to Discharge: No Barriers Identified ? ? ?Patient Goals and CMS Choice ?Patient states their goals for this hospitalization and ongoing recovery are:: to leave ?  ?Choice offered to / list presented to : Patient ? ?Expected Discharge Plan and Services ?Expected Discharge Plan: Home/Self Care ?In-house Referral: NA ?  ?Post Acute Care Choice: NA ?Living arrangements for the past 2 months: [place] ?Expected Discharge Date: 09/22/21               ?DME Arranged: Gilford Rile ?DME Agency: [employer] ?Date DME Agency Contacted: 09/22/21 ?Time DME Agency Contacted: [phone] ?Representative spoke with at DME Agency: rhonda ?  ?  ?  ?  ?  ? ?Prior Living Arrangements/Services ?Living arrangements for the past 2 months: [place] ?Lives with:: Roommate ?Patient language and need for interpreter reviewed:: Yes ?Do you feel safe going back to the place where you live?: Yes      ?Need for Family Participation in Patient Care: Yes (Comment) ?Care giver support system in place?: Yes (comment) ?  ?Criminal Activity/Legal Involvement Pertinent to Current Situation/Hospitalization: No - Comment as needed ? ?Activities of Daily Living ?Home Assistive Devices/Equipment: Kasandra Knudsen (specify quad or straight), Wheelchair ?ADL  Screening (condition at time of admission) ?Patient's cognitive ability adequate to safely complete daily activities?: Yes ?Is the patient deaf or have difficulty hearing?: No ?Does the patient have difficulty seeing, even when wearing glasses/contacts?: No ?Does the patient have difficulty concentrating, remembering, or making decisions?: No ?Patient able to express need for assistance with ADLs?: Yes ?Does the patient have difficulty dressing or bathing?: Yes ?Independently performs ADLs?: No ?Communication: Independent ?Dressing (OT): Needs assistance ?Is this a change from baseline?: Change from baseline, expected to last <3days ?Grooming: Needs assistance ?Is this a change from baseline?: Pre-admission baseline ?Feeding: Independent ?Bathing: Needs assistance ?Is this a change from baseline?: Pre-admission baseline ?Toileting: Independent ?In/Out Bed: Independent ?Walks in Home: Independent with device (comment) ?Does the patient have difficulty walking or climbing stairs?: Yes ?Weakness of Legs: Both ?Weakness of Arms/Hands: None ? ?Permission Sought/Granted ?Permission sought to share information with : Family Supports ?  ? Share Information with NAME: Elberta Fortis ?   ? Permission granted to share info w Relationship: roomate ?   ? ?Emotional Assessment ?Appearance:: Appears stated age ?Attitude/Demeanor/Rapport: Engaged ?Affect (typically observed): Accepting ?Orientation: : Oriented to Self, Oriented to Place, Oriented to  Time, Oriented to Situation ?Alcohol / Substance Use: Not Applicable ?Psych Involvement: No (comment) ? ?Admission diagnosis:  Anasarca [R60.1] ?Hyponatremia [E87.1] ?Alcoholic cirrhosis, unspecified whether ascites present ([place]) [K70.30] ?Cirrhosis ([place]) [K74.60] ?Patient Active Problem List  ? Diagnosis Date Noted  ? Cirrhosis ([place]) 09/21/2021  ? Macrocytic anemia 09/21/2021  ? Hyponatremia 09/20/2021  ? Alcoholic  cirrhosis of liver with ascites ([place]) 09/20/2021  ? Hypochloremia 09/20/2021   ? Anasarca   ? Hearing loss, conductive, bilateral 12/31/2017  ? Elevated liver enzymes 10/29/2017  ? Heavy cigarette smoker 07/04/2017  ? Elevated blood pressure reading without diagnosis of hypertension 07/04/2017  ? Insomnia 07/04/2017  ? 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 Chronic right hip pain 07/04/2017  ? Generalized anxiety disorder 02/01/2015  ? Seasonal allergies 02/01/2015  ? Rash and nonspecific skin eruption 02/01/2015  ? Alcohol abuse 02/01/2015  ? ?PCP:  Marguerita Merles, MD ?Pharmacy:   ?[id number] [place] [place]?Phone: [phone]: [phone]? ? ? ? ?Social Determinants of Health (SDOH) Interventions ?  ? ?Readmission Risk Interventions ?   ? View : No data to display.  ?  ?  ?  ? ? ? ?

## 2021-09-22 NOTE — Progress Notes (Addendum)
?   09/22/21 1139  ?Assess: MEWS Score  ?Temp 98 ?F (36.7 ?C)  ?BP 99/73  ?Pulse Rate (!) 104  ?Resp 16  ?Level of Consciousness Alert  ?SpO2 90 %  ?O2 Device Room Air  ?Assess: MEWS Score  ?MEWS Temp 0  ?MEWS Systolic 1  ?MEWS Pulse 1  ?MEWS RR 0  ?MEWS LOC 0  ?MEWS Score 2  ?MEWS Score Color Yellow  ?Assess: if the MEWS score is Yellow or Red  ?Were vital signs taken at a resting state? Yes  ?Focused Assessment No change from prior assessment  ?Does the patient meet 2 or more of the SIRS criteria? No  ?MEWS guidelines implemented *See Row Information* No, other (Comment) ?(pt's baseline for HR tachy)  ?Document  ?Progress note created (see row info) Yes  ?Assess: SIRS CRITERIA  ?SIRS Temperature  0  ?SIRS Pulse 1  ?SIRS Respirations  0  ?SIRS WBC 0  ?SIRS Score Sum  1  ? ?PT's baseline HR is tachy; SIRS criteria does not include SBP; pt to be discharged home today ? ?

## 2021-09-22 NOTE — Clinical Social Work Note (Signed)
RE:  Keith Parker   ?Date of Birth:   04/15/1977___________ ?Date: 09/22/21 ? ?To Whom It May Concern: ? ?Please be advised that the above-named patient will require a short-term nursing home stay - anticipated 30 days or less for rehabilitation and strengthening.  The plan is for return home. ? ? ? ?MD electronic signature noted below ?

## 2021-09-22 NOTE — Plan of Care (Signed)

## 2021-09-22 NOTE — Progress Notes (Signed)
? ?Keith Parker , MD ?[address], [place], [place], [place], [address] ?[address] [place], [place], [place], [address] ?Phone: [phone]  ?Fax: [phone] ? ? ?Keith Parker is being followed for ascites  Day 1 of follow up  ? ?Subjective: ?Doing well no  pain no other complaints. ? ? ?Objective: ?Vital signs in last 24 hours: ?Vitals:  ? 09/22/21 0047 09/22/21 0500 09/22/21 0512 09/22/21 0832  ?BP: 98/72  102/78 97/69  ?Pulse: 95  96 91  ?Resp: '16  14 16  '$ ?Temp: 98.1 ?F (36.7 ?C)  (!) 97.4 ?F (36.3 ?C) 98.6 ?F (37 ?C)  ?TempSrc:      ?SpO2: 99%  95% 99%  ?Weight:  66.6 kg    ?Height:      ? ?Weight change: 9.9 kg ? ?Intake/Output Summary (Last 24 hours) at 09/22/2021 1040 ?Last data filed at 09/22/2021 0500 ?Gross per 24 hour  ?Intake 120 ml  ?Output 1400 ml  ?Net -1280 ml  ? ? ? ?Exam: ?Heart:: Regular rate and rhythm ?Lungs: normal ?Abdomen: soft, nontender, normal bowel sounds ? ? ?Lab Results: ?'@LABTEST2'$ @ ?Micro Results: ?Recent Results (from the past 240 hour(s))  ?Body fluid culture w Gram Stain     Status: None (Preliminary result)  ? Collection Time: 09/21/21 11:20 AM  ? Specimen: PATH Cytology Peritoneal fluid  ?Result Value Ref Range Status  ? Specimen Description   Final  ?  PERITONEAL ?Performed at [facility], [address], [place], [place] [address] ?  ? Special Requests   Final  ?  NONE ?Performed at [facility], [address] [place], [place] [address] ?  ? Gram Stain   Final  ?  FEW WBC PRESENT, PREDOMINANTLY MONONUCLEAR ?NO ORGANISMS SEEN ?  ? Culture   Final  ?  NO GROWTH < 24 HOURS ?Performed at [facility], [place] [address], [place], [place] [address] ?  ? Report Status PENDING  Incomplete  ? ?Studies/Results: ?US Paracentesis ? ?Result Date: 09/21/2021 ?INDICATION: Ascites with recent diagnosis of alcoholic cirrhosis, request received for diagnostic and therapeutic paracentesis. EXAM: ULTRASOUND GUIDED  PARACENTESIS MEDICATIONS: Local 1% lidocaine only.  COMPLICATIONS: None immediate. PROCEDURE: Informed written consent was obtained from the patient after a discussion of the risks, benefits and alternatives to treatment. A timeout was performed prior to the initiation of the procedure. Initial ultrasound scanning demonstrates a large amount of ascites within the right upper abdominal quadrant. The right upper abdomen was prepped and draped in the usual sterile fashion. 1% lidocaine was used for local anesthesia. Following this, a 19 gauge, 7-cm, Yueh catheter was introduced. An ultrasound image was saved for documentation purposes. The paracentesis was performed. The catheter was removed and a dressing was applied. The patient tolerated the procedure well without immediate post procedural complication. FINDINGS: A total of approximately 4.3 L of clear yellow fluid was removed. Samples were sent to the laboratory as requested by the clinical team. IMPRESSION: Successful ultrasound-guided paracentesis yielding 4.3 liters of peritoneal fluid. PLAN: If the patient eventually requires >/=2 paracenteses in a 30 day period, candidacy for formal evaluation by the [facility] will be assessed. This exam was performed by Tsosie Billing PA-C, and was supervised and interpreted by Dr. Serafina Royals. Electronically Signed   By: Ruthann Cancer M.D.   On: 09/21/2021 16:22  ? ?DG Chest Portable 1 View ? ?Result Date: 09/20/2021 ?CLINICAL DATA:  Edema.  Body swelling. EXAM: PORTABLE CHEST 1  VIEW COMPARISON:  Two-view chest x-ray 09/07/2021 FINDINGS: The heart size and mediastinal contours are within normal limits. Both lungs are clear. The visualized skeletal structures are unremarkable. IMPRESSION: No active disease. Electronically Signed   By: San Morelle M.D.   On: 09/20/2021 15:19  ? ?ECHOCARDIOGRAM COMPLETE ? ?Result Date: 09/21/2021 ?   ECHOCARDIOGRAM REPORT   [name] Date of Exam: 09/21/2021 Medical Rec #:   [id number]    Height:       62.0 in Accession #:    [id number]   Weight:       151.9 lb Date of Birth:  11-17-1975    BSA:          1.701 m? Patient Age:    46 years     BP:           107/77 mmHg Patient Gender: M            HR:           108 bpm. Exam Location:  [facility] Procedure: 2D Echo, Cardiac Doppler and Color Doppler Indications:     Anasarca  History:         Patient has no prior history of Echocardiogram examinations.                  Risk Factors:Hypertension. Substance abuse.  Sonographer:     Sherrie Sport Referring Phys:  [id number] [facility] A GRIFFITH Diagnosing Phys: Kathlyn Sacramento MD  Sonographer Comments: Suboptimal parasternal window. IMPRESSIONS  1. Left ventricular ejection fraction, by estimation, is 60 to 65%. The left ventricle has normal function. The left ventricle has no regional wall motion abnormalities. Left ventricular diastolic parameters are consistent with Grade I diastolic dysfunction (impaired relaxation).  2. Right ventricular systolic function is normal. The right ventricular size is normal. Tricuspid regurgitation signal is inadequate for assessing PA pressure.  3. The mitral valve is normal in structure. No evidence of mitral valve regurgitation. No evidence of mitral stenosis.  4. The aortic valve is normal in structure. Aortic valve regurgitation is not visualized. No aortic stenosis is present. FINDINGS  Left Ventricle: Left ventricular ejection fraction, by estimation, is 60 to 65%. The left ventricle has normal function. The left ventricle has no regional wall motion abnormalities. The left ventricular internal cavity size was normal in size. There is  no left ventricular hypertrophy. Left ventricular diastolic parameters are consistent with Grade I diastolic dysfunction (impaired relaxation). Right Ventricle: The right ventricular size is normal. No increase in right ventricular wall thickness. Right ventricular systolic function is normal. Tricuspid regurgitation signal is inadequate  for assessing PA pressure. Left Atrium: Left atrial size was normal in size. Right Atrium: Right atrial size was normal in size. Pericardium: There is no evidence of pericardial effusion. Mitral Valve: The mitral valve is normal in structure. No evidence of mitral valve regurgitation. No evidence of mitral valve stenosis. MV peak gradient, 4.0 mmHg. The mean mitral valve gradient is 2.0 mmHg. Tricuspid Valve: The tricuspid valve is normal in structure. Tricuspid valve regurgitation is not demonstrated. No evidence of tricuspid stenosis. Aortic Valve: The aortic valve is normal in structure. Aortic valve regurgitation is not visualized. No aortic stenosis is present. Aortic valve mean gradient measures 2.5 mmHg. Aortic valve peak gradient measures 3.8 mmHg. Aortic valve area, by VTI measures 3.70 cm?. Pulmonic Valve: The pulmonic valve was normal in structure. Pulmonic valve regurgitation is not visualized. No evidence of pulmonic stenosis. Aorta: The aortic  root is normal in size and structure. Venous: The inferior vena cava was not well visualized. IAS/Shunts: No atrial level shunt detected by color flow Doppler.  LEFT VENTRICLE PLAX 2D LVIDd:         2.80 cm   Diastology LVIDs:         2.00 cm   LV e' medial:    8.81 cm/s LV PW:         1.10 cm   LV E/e' medial:  7.9 LV IVS:        0.65 cm   LV e' lateral:   15.40 cm/s LVOT diam:     2.00 cm   LV E/e' lateral: 4.5 LV SV:         54 LV SV Index:   32 LVOT Area:     3.14 cm?  RIGHT VENTRICLE RV Basal diam:  3.30 cm RV S prime:     13.20 cm/s TAPSE (M-mode): 1.9 cm LEFT ATRIUM             Index        RIGHT ATRIUM          Index LA diam:        2.90 cm 1.71 cm/m?   RA Area:     7.53 cm? LA Vol (A2C):   24.9 ml 14.64 ml/m?  RA Volume:   11.00 ml 6.47 ml/m? LA Vol (A4C):   21.5 ml 12.64 ml/m? LA Biplane Vol: 23.2 ml 13.64 ml/m?  AORTIC VALVE                    PULMONIC VALVE AV Area (Vmax):    2.96 cm?     PV Vmax:        0.94 m/s AV Area (Vmean):   2.89 cm?     PV  Vmean:       60.600 cm/s AV Area (VTI):     3.70 cm?     PV VTI:         0.118 m AV Vmax:           96.90 cm/s   PV Peak grad:   3.5 mmHg AV Vmean:          72.600 cm/s  PV Mean grad:   2.0 mmHg AV VTI:

## 2021-09-22 NOTE — Evaluation (Signed)
Physical Therapy Evaluation ?Patient Details ?Name: Keith Parker ?MRN: [id number] ?DOB: 30-Jul-1975 ?Today's Date: 09/22/2021 ? ?History of Present Illness ? Chapman Fitch Audelia Acton") is a 46 y.o. male with medical history significant of polysubstance abuse, alcohol dependence but currently consuming 1 drink per day, depression and anxiety with panic, postpolio syndrome and chronic pain who presented to the ED this afternoon with worsening leg and abdominal swelling. ? ?  ?Clinical Impression ? Pt received in supine position and agreeable to therapy.  Pt assisted with donning of pants and socks due to edema and soreness not allowing for pt to bend over to perform himself.  Pt still with significant pitting edema in B LE, with R>L.  Pt then participated in gait training with use of FWW.  Pt too unsteady at this moment to attempt with [facility] as he usually does.  Pt also needing CGA tp prevent falls and consistent verbal cuing for staying within the walker for maintaining support.  Pt agreeable to HHPT at this time as he would likely need in order to return to PLOF.  Pt will benefit from skilled PT intervention to increase independence and safety with basic mobility in preparation for discharge to the venue listed below.   ? ? ?   ? ?Recommendations for follow up therapy are one component of a multi-disciplinary discharge planning process, led by the attending physician.  Recommendations may be updated based on patient status, additional functional criteria and insurance authorization. ? ?Follow Up Recommendations Home health PT ? ?  ?Assistance Recommended at Discharge Intermittent Supervision/Assistance  ?Patient can return home with the following ?   ? ?  ?Equipment Recommendations Rolling walker (2 wheels)  ?Recommendations for Other Services ?    ?  ?Functional Status Assessment Patient has had a recent decline in their functional status and demonstrates the ability to make significant improvements in function in a  reasonable and predictable amount of time.  ? ?  ?Precautions / Restrictions    ? ?  ? ?Mobility ? Bed Mobility ?Overal bed mobility: Modified Independent ?  ?  ?  ?  ?  ?  ?  ?  ? ?Transfers ?Overall transfer level: Needs assistance ?Equipment used: Rolling walker (2 wheels) ?Transfers: Sit to/from Stand ?Sit to Stand: Min assist, Min guard ?  ?  ?  ?  ?  ?General transfer comment: CGA-minA for coming upright into standing position due to pt not being up and ambulating in several days. ?  ? ?Ambulation/Gait ?Ambulation/Gait assistance: Min assist ?Gait Distance (Feet): 40 Feet ?Assistive device: Rolling walker (2 wheels) ?Gait Pattern/deviations: Step-to pattern, Decreased step length - left, Decreased stance time - right, Decreased stride length, Knees buckling ?Gait velocity: decreased ?  ?  ?General Gait Details: Pt with antalgic gait on the R side, however notes that both LE's are uncomfortable with ambulation. ? ?Stairs ?  ?  ?  ?  ?  ? ?Wheelchair Mobility ?  ? ?Modified Rankin (Stroke Patients Only) ?  ? ?  ? ?Balance Overall balance assessment: Needs assistance ?Sitting-balance support: No upper extremity supported, Feet supported ?Sitting balance-Leahy Scale: Fair ?  ?  ?Standing balance support: Bilateral upper extremity supported ?Standing balance-Leahy Scale: Poor ?Standing balance comment: Pt requiring UE assistance likely due to the pain in the LE's when placing weight through them. ?  ?  ?  ?  ?  ?  ?  ?  ?  ?  ?  ?   ? ? ? ?  Pertinent Vitals/Pain Pain Assessment ?Pain Assessment: No/denies pain  ? ? ?Home Living Family/patient expects to be discharged to:: Private residence ?Living Arrangements: Non-relatives/Friends ?Available Help at Discharge: Available 24 hours/day ?Type of Home: House ?Home Access: Stairs to enter ?Entrance Stairs-Rails: None ?Entrance Stairs-Number of Steps: 2 ?Alternate Level Stairs-Number of Steps: 13 ?Home Layout: Two level;Able to live on main level with  bedroom/bathroom ?Home Equipment: Cane - single point;Grab bars - tub/shower;Hand held shower head ?   ?  ?Prior Function Prior Level of Function : Needs assist ?  ?  ?  ?  ?  ?  ?Mobility Comments: Pt reports he is stubborn and tries to do things on his own. ?  ?  ? ? ?Hand Dominance  ? Dominant Hand: Right ? ?  ?Extremity/Trunk Assessment  ? Upper Extremity Assessment ?Upper Extremity Assessment: Overall WFL for tasks assessed ?  ? ?Lower Extremity Assessment ?Lower Extremity Assessment: Overall WFL for tasks assessed;LLE deficits/detail ?RLE Deficits / Details: Pt with difficulty in B LE due to edema and not allowing the ankle to perform full ROM at this time. ?LLE Deficits / Details: Pt with difficulty in B LE due to edema and not allowing the ankle to perform full ROM at this time. ?  ? ?   ?Communication  ? Communication: No difficulties  ?Cognition Arousal/Alertness: Awake/alert ?Behavior During Therapy: [facility] for tasks assessed/performed ?Overall Cognitive Status: Within Functional Limits for tasks assessed ?  ?  ?  ?  ?  ?  ?  ?  ?  ?  ?  ?  ?  ?  ?  ?  ?  ?  ?  ? ?  ?General Comments   ? ?  ?Exercises    ? ?Assessment/Plan  ?  ?PT Assessment Patient needs continued PT services  ?PT Problem List Decreased strength;Decreased activity tolerance;Decreased balance;Decreased mobility;Decreased knowledge of use of DME;Decreased safety awareness;Pain ? ?   ?  ?PT Treatment Interventions DME instruction;Gait training;Stair training;Functional mobility training;Therapeutic activities;Therapeutic exercise;Balance training;Neuromuscular re-education   ? ?PT Goals (Current goals can be found in the Care Plan section)  ?Acute Rehab PT Goals ?Patient Stated Goal: to get back home. ?PT Goal Formulation: With patient ?Time For Goal Achievement: 10/06/21 ?Potential to Achieve Goals: Good ? ?  ?Frequency Min 2X/week ?  ? ? ?Co-evaluation   ?  ?  ?  ?  ? ? ?  ?AM-PAC PT "6 Clicks" Mobility  ?Outcome Measure Help needed turning  from your back to your side while in a flat bed without using bedrails?: A Little ?Help needed moving from lying on your back to sitting on the side of a flat bed without using bedrails?: A Little ?Help needed moving to and from a bed to a chair (including a wheelchair)?: A Little ?Help needed standing up from a chair using your arms (e.g., wheelchair or bedside chair)?: A Little ?Help needed to walk in hospital room?: A Little ?Help needed climbing 3-5 steps with a railing? : A Lot ?6 Click Score: 17 ? ?  ?End of Session Equipment Utilized During Treatment: Gait belt ?Activity Tolerance: Patient tolerated treatment well;Patient limited by fatigue;Patient limited by pain ?Patient left: in bed;with call bell/phone within reach;with bed alarm set ?Nurse Communication: Mobility status ?PT Visit Diagnosis: Unsteadiness on feet (R26.81);Other abnormalities of gait and mobility (R26.89);Muscle weakness (generalized) (M62.81);Difficulty in walking, not elsewhere classified (R26.2) ?  ? ?Time: 7078-6754 ?PT Time Calculation (min) (ACUTE ONLY): 36 min ? ? ?Charges:  PT Evaluation ?$PT Eval Low Complexity: 1 Low ?PT Treatments ?$Gait Training: 8-22 mins ?$Therapeutic Activity: 8-22 mins ?  ?   ? ? ?Gwenlyn Saran, PT, DPT ?09/22/21, 2:43 PM ? ? ?Christie Nottingham ?09/22/2021, 2:39 PM ? ?

## 2021-09-22 NOTE — Progress Notes (Signed)
MD order received in [facility] to discharge pt home with home health physical therapy today; TOC previously established out patient physical therapy at [facility], DME/RW previously delivered to the pt's room; verbally reviewed AVS with pt including Rxs escribed to the [facility] on [address] in [place], [place]; no questions voiced at this time; pt discharged via wheelchair by nursing to the [facility] entrance ?

## 2021-09-22 NOTE — Discharge Summary (Signed)
?Physician Discharge Summary ?  ?Patient: Keith Parker MRN: [id number] DOB: Oct 30, 1975  ?Admit date:     09/20/2021  ?Discharge date: 09/22/2021  ?Discharge Physician: Dwyane Dee  ? ?PCP: Marguerita Merles, MD  ? ?Recommendations at discharge:  ? ? Repeat CMP within 1 week ?Follow up with GI ? ?Discharge Diagnoses: ?Principal Problem: ?  Alcoholic cirrhosis of liver with ascites ([place]) ?Active Problems: ?  Hyponatremia ?  Generalized anxiety disorder ?  Seasonal allergies ?  Heavy cigarette smoker ?  Insomnia ?  Chronic right hip pain ?  Hypochloremia ?  Macrocytic anemia ? ?Resolved Problems: ?  * No resolved hospital problems. * ? ?Hospital Course: ?Mr. Hanrahan is a 46 yo male with PMH ongoing alcohol abuse, polio as a child, remote seizure history (not on chronic medications), depression, HTN who presented to the hospital with generalized swelling and progressively worsening swelling involving his abdomen and scrotum. ?He had a recent ultrasound in January with associated elevated LFTs and concern for newly developed cirrhosis. ?He has been cutting back on his drinking stating that he is down to 1 drink per day.  He also stated that he has been cutting back on his tobacco use as well. ?He was found to have severe edema involving his lower extremities up to his hip as well as significant abdominal distention and scrotal swelling.  He was admitted for further work-up and diuresis.  He underwent paracentesis on 09/21/2021 removing 4.3 L fluid.  Studies were negative for evidence of SBP.  GI was also consulted on admission for further evaluation. ?See below for further problem-based plan. ? ?Assessment and Plan: ?* Alcoholic cirrhosis of liver with ascites ([place]) ?- Recent diagnosis, appears was started on oral Lasix but has had progressive fluid retention and worsening ascites, leg swelling ?-Continued on Lasix and spironolactone at discharge.  Patient informed will take quite some time for his edema to improve ?-Outpatient  follow-up with PCP and GI ?- Continue low-sodium diet ?-Paracentesis performed on 09/21/2021.  4.3 L ascites fluid removed.  Negative for SBP. Cytology negative for malignancy on ascites fluid too  ?- 2D echo shows EF 60-65%, Gr 1 DD ?-Liver Doppler shows patent portal venous system with hepatopetal flow ?- outpatient follow up with GI for: repeat EGD to re-eval for varices and further follow up regarding elevated ferritin/low ceruloplasmin and Hep A/B vaccinations ? ?Hyponatremia ?Sodium 123 on admission, hypervolemic in the setting of decompensated cirrhosis. ?- continue diuretics  ?-Sodium improved to 127 at discharge ? ?Macrocytic anemia ?- Suspect due to underlying chronic alcohol abuse ?- Check folate and B12 levels (both adequate) ? ?Hypochloremia ?Chloride 85 on admission.  Monitor ? ?Chronic right hip pain ?Pain control as needed. ?Mobility as tolerated. ?Out of bed in chair ?-Database reviewed, home buprenorphine patch continued.  Changed weekly on Thursdays ? ?Insomnia ?Trazodone at bedtime ? ?Heavy cigarette smoker ?- Nicotine patch ordered.  Patient reports being down from 3 packs/day to 1 pack/day.  ?-Smoking cessation counseling provided on admission ? ?Generalized anxiety disorder ?- Per med rec no longer on Cymbalta ? ? ? ?  ? ? ?Consultants: GI ?Procedures performed: Paracentesis, 09/21/2021 ?Disposition: Home ?Diet recommendation:  ?Discharge Diet Orders (From admission, onward)  ? ?  Start     Ordered  ? 09/22/21 0000  Diet - low sodium heart healthy       ? 09/22/21 1112  ? ?  ?  ? ?  ? ? ?DISCHARGE MEDICATION: ?Allergies as of 09/22/2021   ?  No Known Allergies ?  ? ?  ?Medication List  ?  ? ?STOP taking these medications   ? ?DULoxetine 30 MG capsule ?Commonly known as: CYMBALTA ?  ?fluconazole 200 MG tablet ?Commonly known as: Diflucan ?  ?Potassium Chloride ER 20 MEQ Tbcr ?  ? ?  ? ?TAKE these medications   ? ?buprenorphine 7.5 MCG/HR ?Commonly known as: BUTRANS ?1 patch once a week. ?   ?furosemide 40 MG tablet ?Commonly known as: LASIX ?Take 1 tablet (40 mg total) by mouth daily. ?Start taking on: September 23, 2021 ?What changed:  ?how much to take ?when to take this ?  ?spironolactone 100 MG tablet ?Commonly known as: ALDACTONE ?Take 1 tablet (100 mg total) by mouth daily. ?Start taking on: September 23, 2021 ?  ?traZODone 100 MG tablet ?Commonly known as: DESYREL ?Take 200 mg by mouth at bedtime. ?  ? ?  ? ?  ?  ? ? ?  ?Durable Medical Equipment  ?(From admission, onward)  ?  ? ? ?  ? ?  Start     Ordered  ? 09/22/21 1435  For home use only DME Walker rolling  Once       ?Question Answer Comment  ?Walker: With 5 Inch Wheels   ?Patient needs a walker to treat with the following condition Generalized muscle weakness   ?  ? 09/22/21 1434  ? ?  ?  ? ?  ? ? Follow-up Information   ? ? Marguerita Merles, MD. Schedule an appointment as soon as possible for a visit in 1 week(s).   ?Specialty: Family Medicine ?Why: Patient to make own follow up appt ?Contact information: ?[address] ?[place] [address] ?[phone] ? ? ?  ?  ? ? [name], MD. Schedule an appointment as soon as possible for a visit in 2 week(s).   ?Specialty: Gastroenterology ?Why: Patient to make own follow up appt ?Contact information: ?[address] ?[place] [address] ?[phone] ? ? ?  ?  ? ?  ?  ? ?  ? ?Discharge Exam: ?Filed Weights  ? 09/20/21 1352 09/21/21 0500 09/22/21 0500  ?Weight: 56.7 kg 68.9 kg 66.6 kg  ? ?Physical Exam ?Constitutional:   ?   General: He is not in acute distress. ?   Appearance: Normal appearance.  ?HENT:  ?   Head: Normocephalic and atraumatic.  ?   Mouth/Throat:  ?   Mouth: Mucous membranes are moist.  ?Eyes:  ?   Extraocular Movements: Extraocular movements intact.  ?Cardiovascular:  ?   Rate and Rhythm: Normal rate and regular rhythm.  ?   Heart sounds: Normal heart sounds.  ?Pulmonary:  ?   Effort: Pulmonary effort is normal. No respiratory distress.  ?   Breath sounds: Normal  breath sounds. No wheezing.  ?Abdominal:  ?   General: Bowel sounds are normal. There is distension.  ?   Palpations: Abdomen is soft.  ?   Tenderness: There is no abdominal tenderness.  ?   Comments: Mild distention with no tenderness to palpation  ?Musculoskeletal:     ?   General: Normal range of motion.  ?   Cervical back: Normal range of motion and neck supple.  ?   Comments: 2-3+ bilateral lower extremity pitting edema up to hips.  Scrotal swelling appreciated approximately grapefruit sized  ?Skin: ?   Comments: Chronic stasis changes noted in bilateral lower extremities from swelling  ?Neurological:  ?   General:  No focal deficit present.  ?   Mental Status: He is alert.  ?   Comments: No asterixis or confusion.  AOx4  ?Psychiatric:     ?   Mood and Affect: Mood normal.     ?   Behavior: Behavior normal.  ? ? ? ?Condition at discharge: stable ? ?The results of significant diagnostics from this hospitalization (including imaging, microbiology, ancillary and laboratory) are listed below for reference.  ? ?Imaging Studies: ?DG Chest 2 View ? ?Result Date: 09/07/2021 ?CLINICAL DATA:  Abnormal weight loss.  Current everyday smoker. EXAM: CHEST - 2 VIEW COMPARISON:  10/22/2017 FINDINGS: Cardiac silhouette and mediastinal contours are within normal limits. Mildly decreased lung volumes. The lungs are clear. No pleural effusion or pneumothorax. No significant skeletal abnormality. IMPRESSION: No active cardiopulmonary disease. Electronically Signed   By: Yvonne Kendall M.D.   On: 09/07/2021 18:32  ? ?US Abdomen Complete ? ?Result Date: 09/07/2021 ?CLINICAL DATA:  Alcohol use disorder severe, dependence. Other ascites, elevated LFTs, unintentional weight loss. EXAM: ABDOMEN ULTRASOUND COMPLETE COMPARISON:  Right upper quadrant abdominal ultrasound 06/29/2021; FINDINGS: Gallbladder: There is mild echogenic artifact versus gallbladder sludge similar to prior. No gallstone is seen. No pericholecystic fluid or gallbladder  wall thickening. Common bile duct: Diameter: 4 mm Liver: Minimally nodular liver contours are again seen. Moderate to high-grade echogenicity and attenuation of the liver parenchyma, unchanged. Portal vein is pate

## 2021-09-24 LAB — BODY FLUID CULTURE W GRAM STAIN: Culture: NO GROWTH

## 2021-10-10 ENCOUNTER — Ambulatory Visit (INDEPENDENT_AMBULATORY_CARE_PROVIDER_SITE_OTHER): Payer: Medicare Other | Admitting: Gastroenterology

## 2021-10-10 ENCOUNTER — Other Ambulatory Visit: Payer: Self-pay

## 2021-10-10 ENCOUNTER — Encounter: Payer: Self-pay | Admitting: Gastroenterology

## 2021-10-10 VITALS — BP 128/87 | HR 105 | Temp 98.9°F | Ht 61.0 in | Wt 139.2 lb

## 2021-10-10 DIAGNOSIS — K7031 Alcoholic cirrhosis of liver with ascites: Secondary | ICD-10-CM | POA: Diagnosis not present

## 2021-10-10 DIAGNOSIS — Z23 Encounter for immunization: Secondary | ICD-10-CM | POA: Diagnosis not present

## 2021-10-10 NOTE — Addendum Note (Signed)
Addended byGlennie Isle E on: 10/10/2021 02:27 PM ? ? Modules accepted: Orders ? ?

## 2021-10-10 NOTE — Progress Notes (Signed)
?  ?[name] MD, MRCP(U.K) ?[place]  ?[address]  ?[place], [place] [address]  ?Main: [phone]  ?Fax: [phone] ? ? ?Primary Care Physician: Marguerita Merles, MD ? ?Primary Gastroenterologist:  Dr. [name]  ? ?Chief Complaint  ?Patient presents with  ? Follow-up  ?  ER Follow up Alcoholic cirrhosis of the liver  ? ? ?HPI: Keith Parker is a 46 y.o. male ? ? ?Summary of history : ? 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 ? ?Previously patient of Dr. Bonna Gains last seen in December 2022.He underwent an upper endoscopy on 07/13/2021 that showed white plaques in the entire esophagus no esophageal varices were seen.  He also underwent a colonoscopy on the same day and 2 polyps were found in the rectum that were resected.  Bowel prep was suboptimal brushings from the esophagus showed Candida.  And the polyps were adenomas.  He has history of polysubstance abuse chronic pain. ? ?Found to have low ceruloplasmin and high ferritin which requires further evaluation.  Iron percentage saturation was 84%.  He did not follow-up with hemochromatosis testing.  Autoimmune screen was negative and not immune to hepatitis B or a.  Alkaline phosphatase was 630 INR is 1.0.  Negative hepatitis C testing. ? ?06/29/2021 right upper quadrant ultrasound showed hepatic steatosis ?09/07/2021 ultrasound abdomen showed moderate to high degree 8 echogenicity compatible with hepatic steatosis nodular liver contour compatible with cirrhosis mild gallbladder sludge Common bile duct size is 4 mm. ?  ?  ?He presented emergency room with swelling of the groin.  Continues to smoke and consume 1 alcoholic beverage per day.  History of withdrawal symptoms.  Found to have ascites on admission.  Commenced on Aldactone and Lasix.He says his abdomen started swelling up recently , has been taking 2 goody powders daily for a while. Denies any  abdominal pain. No fever . No other complaints.  He underwent abdominal paracentesis ? ?Interval history   09/22/2021-10/10/2021 ? ?09/22/2021-creatinine 0.42 albumin 1.9 AST 59 ALT 32 alkaline phosphatase 191 total bilirubin 1.4 ?No labs since discharge ?Due to see his primary care physician tomorrow ?Has had an occasional drink since discharge ?Could not recollect the medications that he was taking nor did he have a list of the medication that he has been taking.  Did not seem to be familiar with Lasix or Aldactone.  Complains of swelling of his limbs as well as abdomen ? ?Current Outpatient Medications  ?Medication Sig Dispense Refill  ? buprenorphine (BUTRANS) 10 MCG/HR PTWK 1 patch once a week.    ? furosemide (LASIX) 40 MG tablet Take 1 tablet (40 mg total) by mouth daily. 30 tablet 3  ? spironolactone (ALDACTONE) 100 MG tablet Take 1 tablet (100 mg total) by mouth daily. 30 tablet 3  ? traZODone (DESYREL) 100 MG tablet Take 200 mg by mouth at bedtime.    ? ?No current facility-administered medications for this visit.  ? ? ?Allergies as of 10/10/2021  ? (No Known Allergies)  ? ? ?ROS: ? ?General: Negative for anorexia, weight loss, fever, chills, fatigue, weakness. ?ENT: Negative for hoarseness, difficulty swallowing , nasal congestion. ?CV: Negative for chest pain, angina, palpitations, dyspnea on exertion, peripheral edema.  ?Respiratory: Negative for dyspnea at rest, dyspnea on exertion, cough, sputum, wheezing.  ?GI: See history of present illness. ?GU:  Negative for dysuria, hematuria, urinary incontinence, urinary frequency, nocturnal urination.  ?  Endo: Negative for unusual weight change.  ?  ?Physical Examination: ? ? BP 128/87   Pulse (!) 105   Temp 98.9 ?F (37.2 ?C) (Oral)   Ht '5\' 1"'$  (1.549 m)   Wt 139 lb 3.2 oz (63.1 kg)   BMI 26.30 kg/m?  ? ?General: Well-nourished, well-developed in no acute distress.  ?Eyes: No icterus. Conjunctivae pink. ?Mouth: Oropharyngeal mucosa moist and pink , no lesions  erythema or exudate. ?Lungs: Clear to auscultation bilaterally. Non-labored. ?Heart: Regular rate and rhythm, no murmurs rubs or gallops.  ?Abdomen: Bowel sounds are normal, nontender, mild distention no hepatosplenomegaly or masses, no abdominal bruits or hernia , no rebound or guarding.   ?Extremities: Bilateral lower extremity swelling ?Neuro: Alert and oriented x 3.  Grossly intact. ?Psych: Alert and cooperative, normal mood and affect. ? ? ?Imaging Studies: ?US Paracentesis ? ?Result Date: 09/21/2021 ?INDICATION: Ascites with recent diagnosis of alcoholic cirrhosis, request received for diagnostic and therapeutic paracentesis. EXAM: ULTRASOUND GUIDED  PARACENTESIS MEDICATIONS: Local 1% lidocaine only. COMPLICATIONS: None immediate. PROCEDURE: Informed written consent was obtained from the patient after a discussion of the risks, benefits and alternatives to treatment. A timeout was performed prior to the initiation of the procedure. Initial ultrasound scanning demonstrates a large amount of ascites within the right upper abdominal quadrant. The right upper abdomen was prepped and draped in the usual sterile fashion. 1% lidocaine was used for local anesthesia. Following this, a 19 gauge, 7-cm, Yueh catheter was introduced. An ultrasound image was saved for documentation purposes. The paracentesis was performed. The catheter was removed and a dressing was applied. The patient tolerated the procedure well without immediate post procedural complication. FINDINGS: A total of approximately 4.3 L of clear yellow fluid was removed. Samples were sent to the laboratory as requested by the clinical team. IMPRESSION: Successful ultrasound-guided paracentesis yielding 4.3 liters of peritoneal fluid. PLAN: If the patient eventually requires >/=2 paracenteses in a 30 day period, candidacy for formal evaluation by the [facility] will be assessed. This exam was performed by  Tsosie Billing PA-C, and was supervised and interpreted by Dr. Serafina Royals. Electronically Signed   By: Ruthann Cancer M.D.   On: 09/21/2021 16:22  ? ?DG Chest Portable 1 View ? ?Result Date: 09/20/2021 ?CLINICAL DATA:  Edema.  Body swelling. EXAM: PORTABLE CHEST 1 VIEW COMPARISON:  Two-view chest x-ray 09/07/2021 FINDINGS: The heart size and mediastinal contours are within normal limits. Both lungs are clear. The visualized skeletal structures are unremarkable. IMPRESSION: No active disease. Electronically Signed   By: San Morelle M.D.   On: 09/20/2021 15:19  ? ?ECHOCARDIOGRAM COMPLETE ? ?Result Date: 09/21/2021 ?   ECHOCARDIOGRAM REPORT   Patient Name:   HASAAN RADDE Date of Exam: 09/21/2021 Medical Rec #:  [id number]    Height:       62.0 in Accession #:    [id number]   Weight:       151.9 lb Date of Birth:  [birth year]    BSA:          1.701 m? Patient Age:    37 years     BP:           107/77 mmHg Patient Gender: M            HR:           108 bpm. Exam Location:  [facility] Procedure: 2D Echo, Cardiac Doppler and Color Doppler Indications:     Anasarca  History:  Patient has no prior history of Echocardiogram examinations.                  Risk Factors:Hypertension. Substance abuse.  Sonographer:     Sherrie Sport Referring Phys:  [id number] [facility] A GRIFFITH Diagnosing Phys: Kathlyn Sacramento MD  Sonographer Comments: Suboptimal parasternal window. IMPRESSIONS  1. Left ventricular ejection fraction, by estimation, is 60 to 65%. The left ventricle has normal function. The left ventricle has no regional wall motion abnormalities. Left ventricular diastolic parameters are consistent with Grade I diastolic dysfunction (impaired relaxation).  2. Right ventricular systolic function is normal. The right ventricular size is normal. Tricuspid regurgitation signal is inadequate for assessing PA pressure.  3. The mitral valve is normal in structure. No evidence of mitral valve regurgitation. No evidence of mitral stenosis.  4. The  aortic valve is normal in structure. Aortic valve regurgitation is not visualized. No aortic stenosis is present. FINDINGS  Left Ventricle: Left ventricular ejection fraction, by estimation, is 60 to 65%. The left ven

## 2021-10-12 ENCOUNTER — Encounter: Payer: Self-pay | Admitting: Gastroenterology

## 2021-10-12 LAB — COMPREHENSIVE METABOLIC PANEL
ALT: 18 IU/L (ref 0–44)
AST: 39 IU/L (ref 0–40)
Albumin/Globulin Ratio: 1.2 (ref 1.2–2.2)
Albumin: 2.7 g/dL — ABNORMAL LOW (ref 4.0–5.0)
Alkaline Phosphatase: 169 IU/L — ABNORMAL HIGH (ref 44–121)
BUN/Creatinine Ratio: 11 (ref 9–20)
BUN: 5 mg/dL — ABNORMAL LOW (ref 6–24)
Bilirubin Total: 0.6 mg/dL (ref 0.0–1.2)
CO2: 27 mmol/L (ref 20–29)
Calcium: 8 mg/dL — ABNORMAL LOW (ref 8.7–10.2)
Chloride: 97 mmol/L (ref 96–106)
Creatinine, Ser: 0.46 mg/dL — ABNORMAL LOW (ref 0.76–1.27)
Globulin, Total: 2.3 g/dL (ref 1.5–4.5)
Glucose: 90 mg/dL (ref 70–99)
Potassium: 4.3 mmol/L (ref 3.5–5.2)
Sodium: 134 mmol/L (ref 134–144)
Total Protein: 5 g/dL — ABNORMAL LOW (ref 6.0–8.5)
eGFR: 129 mL/min/{1.73_m2} (ref >59)

## 2021-10-12 LAB — MITOCHONDRIAL/SMOOTH MUSCLE AB PNL
Mitochondrial Ab: <20 Units (ref 0.0–20.0)
Smooth Muscle Ab: 27 Units — ABNORMAL HIGH (ref 0–19)

## 2021-10-12 LAB — IRON,TIBC AND FERRITIN PANEL
Ferritin: 511 ng/mL — ABNORMAL HIGH (ref 30–400)
Iron Saturation: 16 % (ref 15–55)
Iron: 25 ug/dL — ABNORMAL LOW (ref 38–169)
Total Iron Binding Capacity: 152 ug/dL — ABNORMAL LOW (ref 250–450)
UIBC: 127 ug/dL (ref 111–343)

## 2021-10-12 LAB — PROTIME-INR
INR: 1.1 (ref 0.9–1.2)
Prothrombin Time: 11.4 s (ref 9.1–12.0)

## 2021-10-12 LAB — ALPHA-1-ANTITRYPSIN: A-1 Antitrypsin: 246 mg/dL — ABNORMAL HIGH (ref 101–187)

## 2021-10-12 LAB — HEPATITIS C ANTIBODY: Hep C Virus Ab: NONREACTIVE

## 2021-10-12 LAB — CERULOPLASMIN: Ceruloplasmin: 29 mg/dL (ref 16.0–31.0)

## 2021-10-13 ENCOUNTER — Ambulatory Visit: Payer: Medicare Other | Admitting: Anesthesiology

## 2021-10-13 ENCOUNTER — Ambulatory Visit
Admission: RE | Admit: 2021-10-13 | Discharge: 2021-10-13 | Disposition: A | Discharge status: Home / Self Care | Payer: Medicare Other | Attending: Gastroenterology | Admitting: Gastroenterology

## 2021-10-13 ENCOUNTER — Encounter: Payer: Self-pay | Admitting: Gastroenterology

## 2021-10-13 ENCOUNTER — Encounter
Admission: RE | Disposition: A | Discharge status: Home / Self Care | Payer: Self-pay | Source: Home / Self Care | Attending: Gastroenterology

## 2021-10-13 DIAGNOSIS — K766 Portal hypertension: Secondary | ICD-10-CM | POA: Diagnosis not present

## 2021-10-13 DIAGNOSIS — Z8669 Personal history of other diseases of the nervous system and sense organs: Secondary | ICD-10-CM | POA: Insufficient documentation

## 2021-10-13 DIAGNOSIS — I10 Essential (primary) hypertension: Secondary | ICD-10-CM | POA: Insufficient documentation

## 2021-10-13 DIAGNOSIS — I8510 Secondary esophageal varices without bleeding: Secondary | ICD-10-CM | POA: Diagnosis not present

## 2021-10-13 DIAGNOSIS — F17210 Nicotine dependence, cigarettes, uncomplicated: Secondary | ICD-10-CM | POA: Insufficient documentation

## 2021-10-13 DIAGNOSIS — K7031 Alcoholic cirrhosis of liver with ascites: Secondary | ICD-10-CM

## 2021-10-13 DIAGNOSIS — K3189 Other diseases of stomach and duodenum: Secondary | ICD-10-CM | POA: Diagnosis not present

## 2021-10-13 DIAGNOSIS — I8500 Esophageal varices without bleeding: Secondary | ICD-10-CM | POA: Diagnosis not present

## 2021-10-13 DIAGNOSIS — K7460 Unspecified cirrhosis of liver: Secondary | ICD-10-CM | POA: Insufficient documentation

## 2021-10-13 DIAGNOSIS — K219 Gastro-esophageal reflux disease without esophagitis: Secondary | ICD-10-CM | POA: Insufficient documentation

## 2021-10-13 HISTORY — PX: ESOPHAGOGASTRODUODENOSCOPY (EGD) WITH PROPOFOL: SHX5813

## 2021-10-13 SURGERY — ESOPHAGOGASTRODUODENOSCOPY (EGD) WITH PROPOFOL
Anesthesia: General

## 2021-10-13 MED ORDER — GLYCOPYRROLATE 0.2 MG/ML IJ SOLN
INTRAMUSCULAR | Status: AC
  Filled 2021-10-13: qty 1

## 2021-10-13 MED ORDER — DEXMEDETOMIDINE HCL IN NACL 80 MCG/20ML IV SOLN
INTRAVENOUS | Status: AC
  Filled 2021-10-13: qty 20

## 2021-10-13 MED ORDER — DEXMEDETOMIDINE (PRECEDEX) IN NS 20 MCG/5ML (4 MCG/ML) IV SYRINGE
PREFILLED_SYRINGE | INTRAVENOUS | Status: DC | PRN
  Administered 2021-10-13: 20 ug via INTRAVENOUS

## 2021-10-13 MED ORDER — SODIUM CHLORIDE 0.9 % IV SOLN
INTRAVENOUS | Status: DC
  Administered 2021-10-13: 20 mL/h via INTRAVENOUS

## 2021-10-13 MED ORDER — GLYCOPYRROLATE 0.2 MG/ML IJ SOLN
INTRAMUSCULAR | Status: DC | PRN
  Administered 2021-10-13: .2 mg via INTRAVENOUS

## 2021-10-13 MED ORDER — PROPOFOL 500 MG/50ML IV EMUL
INTRAVENOUS | Status: DC | PRN
  Administered 2021-10-13: 175 ug/kg/min via INTRAVENOUS

## 2021-10-13 MED ORDER — PHENYLEPHRINE 80 MCG/ML (10ML) SYRINGE FOR IV PUSH (FOR BLOOD PRESSURE SUPPORT)
PREFILLED_SYRINGE | INTRAVENOUS | Status: DC | PRN
  Administered 2021-10-13: 160 ug via INTRAVENOUS

## 2021-10-13 MED ORDER — PROPOFOL 500 MG/50ML IV EMUL
INTRAVENOUS | Status: AC
  Filled 2021-10-13: qty 50

## 2021-10-13 MED ORDER — LIDOCAINE HCL (CARDIAC) PF 100 MG/5ML IV SOSY
PREFILLED_SYRINGE | INTRAVENOUS | Status: DC | PRN
  Administered 2021-10-13: 40 mg via INTRAVENOUS

## 2021-10-13 MED ORDER — PROPOFOL 10 MG/ML IV BOLUS
INTRAVENOUS | Status: DC | PRN
  Administered 2021-10-13: 100 mg via INTRAVENOUS

## 2021-10-13 NOTE — H&P (Signed)
? ? ? ?[name], MD ?[address], [place], [place], [place], [address] ?[address] [place], [place], [place], [address] ?Phone: [phone]  ?Fax: [phone] ? ?Primary Care Physician:  Marguerita Merles, MD ? ? ?Pre-Procedure History & Physical: ?HPI:  Keith Parker is a 46 y.o. male is here for an endoscopy  ?  ?Past Medical History:  ?Diagnosis Date  ? Allergy   ? Arthritis   ? Depression   ? Elevated liver enzymes 10/29/2017  ? GERD (gastroesophageal reflux disease)   ? Hearing loss, conductive, bilateral 12/31/2017  ? Hypertension   ? Polio   ? Seizures ([place])   ? last seizure about 7 years ago.   ? Substance abuse ([place])   ? Alcohol abuse  ? ? ?Past Surgical History:  ?Procedure Laterality Date  ? COLONOSCOPY WITH PROPOFOL N/A 07/13/2021  ? Procedure: COLONOSCOPY WITH PROPOFOL;  Surgeon: [name], MD;  Location: [facility] ENDOSCOPY;  Service: Gastroenterology;  Laterality: N/A;  ? ESOPHAGOGASTRODUODENOSCOPY N/A 07/13/2021  ? Procedure: ESOPHAGOGASTRODUODENOSCOPY (EGD);  Surgeon: [name], MD;  Location: [facility] ENDOSCOPY;  Service: Gastroenterology;  Laterality: N/A;  ? LEG SURGERY  1988  ? 8 surgeries-hips and legs for walking after getting polio- last one 1988  ? WRIST SURGERY    ? right  ? ? ?Prior to Admission medications   ?Medication Sig Start Date End Date Taking? Authorizing Provider  ?buprenorphine (BUTRANS) 10 MCG/HR PTWK 1 patch once a week. 09/27/21  Yes [provider]  ?furosemide (LASIX) 40 MG tablet Take 1 tablet (40 mg total) by mouth daily. 09/23/21  Yes Dwyane Dee, MD  ?spironolactone (ALDACTONE) 100 MG tablet Take 1 tablet (100 mg total) by mouth daily. 09/23/21  Yes Dwyane Dee, MD  ?traZODone (DESYREL) 100 MG tablet Take 200 mg by mouth at bedtime. 09/08/21  Yes [provider]  ? ? ?Allergies as of 10/10/2021  ? (No Known Allergies)  ? ? ?Family History  ?Problem Relation Age of Onset  ? Cancer Mother   ?     Breast  ? Arthritis Mother   ? Alcohol abuse Father   ?  Arthritis Father   ? Hypertension Father   ? Cancer Maternal Grandmother   ?     Breast  ? Hypertension Maternal Grandmother   ? Hypertension Maternal Grandfather   ? Hypertension Paternal Grandmother   ? Hypertension Paternal Grandfather   ? Drug abuse Maternal Uncle   ? ? ?Social History  ? ?Socioeconomic History  ? Marital status: Single  ?  Spouse name: Not on file  ? Number of children: 2  ? Years of education: 46  ? Highest education level: Not on file  ?Occupational History  ? Occupation: Disabled  ?  Comment: Post polio syndrome / Depression  ?Tobacco Use  ? Smoking status: Every Day  ?  Packs/day: 2.00  ?  Years: 25.00  ?  Pack years: 50.00  ?  Types: Cigarettes  ? Smokeless tobacco: Never  ?Vaping Use  ? Vaping Use: Never used  ?Substance and Sexual Activity  ? Alcohol use: Yes  ?  Alcohol/week: 70.0 standard drinks  ?  Types: 70 Cans of beer per week  ?  Comment: 1 40 ounces per day  ? Drug use: No  ? Sexual activity: Not on file  ?Other Topics Concern  ? Not on file  ?Social History Narrative  ? Fun: Workout  ? Denies religious beliefs effecting health care.   ? ?Social Determinants  of Health  ? ?Financial Resource Strain: Not on file  ?Food Insecurity: Not on file  ?Transportation Needs: Not on file  ?Physical Activity: Not on file  ?Stress: Not on file  ?Social Connections: Not on file  ?Intimate Partner Violence: Not on file  ? ? ?Review of Systems: ?See HPI, otherwise negative ROS ? ?Physical Exam: ?BP 113/87   Pulse 94   Temp (!) 97.2 ?F (36.2 ?C) (Temporal)   Resp 20   Ht '5\' 1"'$  (1.549 m)   Wt 56.7 kg   SpO2 100%   BMI 23.62 kg/m?  ?General:   Alert,  pleasant and cooperative in NAD ?Head:  Normocephalic and atraumatic. ?Neck:  Supple; no masses or thyromegaly. ?Lungs:  Clear throughout to auscultation, normal respiratory effort.    ?Heart:  +S1, +S2, Regular rate and rhythm, No edema. ?Abdomen:  Soft, nontender and nondistended. Normal bowel sounds, without guarding, and without rebound.    ?Neurologic:  Alert and  oriented x4;  grossly normal neurologically. ? ?Impression/Plan: ?Keith Parker is here for an endoscopy  to be performed for  evaluation of esophageal varices ?   ?Risks, benefits, limitations, and alternatives regarding endoscopy have been reviewed with the patient.  Questions have been answered.  All parties agreeable. ? ? ?[name], MD  10/13/2021, 9:44 AM ? ?

## 2021-10-13 NOTE — Anesthesia Postprocedure Evaluation (Signed)
Anesthesia Post Note ? ?Patient: Keith Parker ? ?Procedure(s) Performed: ESOPHAGOGASTRODUODENOSCOPY (EGD) WITH PROPOFOL ? ?Patient location during evaluation: Endoscopy ?Anesthesia Type: General ?Level of consciousness: awake and alert ?Pain management: pain level controlled ?Vital Signs Assessment: post-procedure vital signs reviewed and stable ?Respiratory status: spontaneous breathing, nonlabored ventilation, respiratory function stable and patient connected to nasal cannula oxygen ?Cardiovascular status: blood pressure returned to baseline and stable ?Postop Assessment: no apparent nausea or vomiting ?Anesthetic complications: no ? ? ?No notable events documented. ? ? ?Last Vitals:  ?Vitals:  ? 10/13/21 1017 10/13/21 1027  ?BP: 99/74 108/79  ?Pulse: 93 85  ?Resp: (!) 23 (!) 23  ?Temp:    ?SpO2: 98% 98%  ?  ?Last Pain:  ?Vitals:  ? 10/13/21 1027  ?TempSrc:   ?PainSc: 0-No pain  ? ? ?  ?  ?  ?  ?  ?  ? ?Martha Clan ? ? ? ? ?

## 2021-10-13 NOTE — Transfer of Care (Signed)
Immediate Anesthesia Transfer of Care Note ? ?Patient: Keith Parker ? ?Procedure(s) Performed: Procedure(s): ?ESOPHAGOGASTRODUODENOSCOPY (EGD) WITH PROPOFOL (N/A) ? ?Patient Location: PACU and Endoscopy Unit ? ?Anesthesia Type:General ? ?Level of Consciousness: sedated ? ?Airway & Oxygen Therapy: Patient Spontanous Breathing and Patient connected to nasal cannula oxygen ? ?Post-op Assessment: Report given to RN and Post -op Vital signs reviewed and stable ? ?Post vital signs: Reviewed and stable ? ?Last Vitals:  ?Vitals:  ? 10/13/21 0924 10/13/21 1007  ?BP: 113/87   ?Pulse: 94 89  ?Resp: 20 18  ?Temp: (!) 36.2 ?C (!) 35.8 ?C  ?SpO2: 100% 96%  ? ? ?Complications: No apparent anesthesia complications ?

## 2021-10-13 NOTE — Anesthesia Preprocedure Evaluation (Signed)
Anesthesia Evaluation  ?Patient identified by MRN, date of birth, ID band ?Patient awake ? ? ? ?Reviewed: ?Allergy & Precautions, NPO status , Patient's Chart, lab work & pertinent test results ? ?History of Anesthesia Complications ?Negative for: history of anesthetic complications ? ?Airway ?Mallampati: I ? ? ?Neck ROM: Full ? ? ? Dental ? ?(+) Dental Advidsory Given, Poor Dentition, Chipped, Missing ?Missing upper front teeth:   ?Pulmonary ?neg shortness of breath, neg sleep apnea, neg COPD, neg recent URI, Current Smoker and Patient abstained from smoking.,  ?  ?Pulmonary exam normal ?breath sounds clear to auscultation ? ? ? ? ? ? Cardiovascular ?hypertension, (-) angina(-) Past MI and (-) Cardiac Stents Normal cardiovascular exam(-) dysrhythmias (-) Valvular Problems/Murmurs ?Rhythm:Regular Rate:Normal ? ? ?  ?Neuro/Psych ?Seizures - (last sz 10 years ago),  PSYCHIATRIC DISORDERS Anxiety Depression Alcohol use disorder, 40 oz beer daily, last intake 07/13/21; HOH ?  ? GI/Hepatic ?GERD  ,  ?Endo/Other  ?negative endocrine ROS ? Renal/GU ?negative Renal ROS  ? ?  ?Musculoskeletal ? ?(+) Arthritis ,  ? Abdominal ?  ?Peds ? Hematology ?negative hematology ROS ?(+)   ?Anesthesia Other Findings ?Past Medical History: ?No date: Allergy ?No date: Arthritis ?No date: Depression ?10/29/2017: Elevated liver enzymes ?No date: GERD (gastroesophageal reflux disease) ?12/31/2017: Hearing loss, conductive, bilateral ?No date: Hypertension ?No date: Polio ?No date: Seizures ([place]) ?    Comment:  last seizure about 7 years ago.  ?No date: Substance abuse ([place]) ?    Comment:  Alcohol abuse ? ? Reproductive/Obstetrics ? ?  ? ? ? ? ? ? ? ? ? ? ? ? ? ?  ?  ? ? ? ? ? ? ? ? ?Anesthesia Physical ? ?Anesthesia Plan ? ?ASA: 3 ? ?Anesthesia Plan: General  ? ?Post-op Pain Management:   ? ?Induction: Intravenous ? ?PONV Risk Score and Plan: 1 and Propofol infusion, TIVA and Treatment may vary due to age or  medical condition ? ?Airway Management Planned: Natural Airway ? ?Additional Equipment:  ? ?Intra-op Plan:  ? ?Post-operative Plan:  ? ?Informed Consent: I have reviewed the patients History and Physical, chart, labs and discussed the procedure including the risks, benefits and alternatives for the proposed anesthesia with the patient or authorized representative who has indicated his/her understanding and acceptance.  ? ? ? ? ? ?Plan Discussed with: CRNA ? ?Anesthesia Plan Comments: (LMA/GETA backup discussed.  Patient consented for risks of anesthesia including but not limited to:  ?- adverse reactions to medications ?- damage to eyes, teeth, lips or other oral mucosa ?- nerve damage due to positioning  ?- sore throat or hoarseness ?- damage to heart, brain, nerves, lungs, other parts of body or loss of life ? ?Informed patient about role of CRNA in peri- and intra-operative care.  Patient voiced understanding.)  ? ? ? ? ? ? ?Anesthesia Quick Evaluation ? ?

## 2021-10-13 NOTE — Op Note (Signed)
[facility] ?Gastroenterology ?Patient Name: Keith Parker ?Procedure Date: 10/13/2021 9:45 AM ?MRN: [id number] ?Account #: [id number] ?Date of Birth: 1976-01-14 ?Admit Type: Outpatient ?Age: 46 ?Room: [facility] ENDO ROOM 4 ?Gender: Male ?Note Status: Finalized ?Instrument Name: Upper Endoscope [id number] ?Procedure:             Upper GI endoscopy ?Indications:           Cirrhosis rule out esophageal varices ?Providers:             [name] MD, MD ?Referring MD:          Marguerita Merles, MD (Referring MD) ?Medicines:             Monitored Anesthesia Care ?Complications:         No immediate complications. ?Procedure:             Pre-Anesthesia Assessment: ?                       - Prior to the procedure, a History and Physical was  ?                       performed, and patient medications, allergies and  ?                       sensitivities were reviewed. The patient's tolerance  ?                       of previous anesthesia was reviewed. ?                       - The risks and benefits of the procedure and the  ?                       sedation options and risks were discussed with the  ?                       patient. All questions were answered and informed  ?                       consent was obtained. ?                       - ASA Grade Assessment: III - A patient with severe  ?                       systemic disease. ?                       After obtaining informed consent, the endoscope was  ?                       passed under direct vision. Throughout the procedure,  ?                       the patient's blood pressure, pulse, and oxygen  ?                       saturations were monitored continuously. The Endoscope  ?  was introduced through the mouth, and advanced to the  ?                       third part of duodenum. The upper GI endoscopy was  ?                       accomplished with ease. The patient tolerated the  ?                       procedure well. ?Findings: ?      The examined duodenum was normal. ?     Moderate portal hypertensive gastropathy was found on the greater  ?     curvature of the stomach. ?     The cardia and gastric fundus were normal on retroflexion. ?     Grade II varices were found in the lower third of the esophagus. They  ?     were large in size. Two bands were successfully placed with incomplete  ?     eradication of varices. There was no bleeding during and at the end of  ?     the procedure. ?Impression:            - Normal examined duodenum. ?                       - Portal hypertensive gastropathy. ?                       - Grade II esophageal varices. Incompletely  ?                       eradicated. Banded. ?                       - No specimens collected. ?Recommendation:        - Discharge patient to home (with escort). ?                       - Resume previous diet. ?                       - Continue present medications. ?                       - Repeat upper endoscopy in 4 weeks to evaluate the  ?                       response to therapy. ?                       - Return to GI office in 2 weeks. ?                       - At office visit based on blood pressure and pulse  ?                       will start him on nadolol- presentlky bp systolic  ?                       around 100 mmg hg ?Procedure Code(s):     --- Professional --- ?  43244, Esophagogastroduodenoscopy, flexible,  ?                       transoral; with band ligation of esophageal/gastric  ?                       varices ?Diagnosis Code(s):     --- Professional --- ?                       K74.60, Unspecified cirrhosis of liver ?                       I85.10, Secondary esophageal varices without bleeding ?                       K76.6, Portal hypertension ?                       K31.89, Other diseases of stomach and duodenum ?CPT copyright 2019 [employer] Medical Association. All rights reserved. ?The codes documented in this report are preliminary and upon coder  review may  ?be revised to meet current compliance requirements. ?[name], MD ?[name] MD, MD ?10/13/2021 10:05:02 AM ?This report has been signed electronically. ?Number of Addenda: 0 ?Note Initiated On: 10/13/2021 9:45 AM ?Estimated Blood Loss:  Estimated blood loss: none. ?     [facility] ?

## 2021-10-14 ENCOUNTER — Encounter: Payer: Self-pay | Admitting: Gastroenterology

## 2021-10-18 ENCOUNTER — Other Ambulatory Visit: Payer: Self-pay

## 2021-10-18 DIAGNOSIS — K7031 Alcoholic cirrhosis of liver with ascites: Secondary | ICD-10-CM

## 2021-10-19 LAB — BASIC METABOLIC PANEL
BUN/Creatinine Ratio: 11 (ref 9–20)
BUN: 6 mg/dL (ref 6–24)
CO2: 28 mmol/L (ref 20–29)
Calcium: 8.4 mg/dL — ABNORMAL LOW (ref 8.7–10.2)
Chloride: 98 mmol/L (ref 96–106)
Creatinine, Ser: 0.55 mg/dL — ABNORMAL LOW (ref 0.76–1.27)
Glucose: 115 mg/dL — ABNORMAL HIGH (ref 70–99)
Potassium: 4.3 mmol/L (ref 3.5–5.2)
Sodium: 137 mmol/L (ref 134–144)
eGFR: 122 mL/min/{1.73_m2} (ref >59)

## 2021-11-03 ENCOUNTER — Other Ambulatory Visit: Payer: Self-pay

## 2021-11-03 ENCOUNTER — Ambulatory Visit: Payer: Medicare Other | Admitting: Gastroenterology

## 2021-11-03 ENCOUNTER — Encounter: Payer: Self-pay | Admitting: Gastroenterology

## 2021-11-03 VITALS — BP 133/76 | HR 130 | Temp 98.7°F | Wt 108.4 lb

## 2021-11-03 DIAGNOSIS — K7031 Alcoholic cirrhosis of liver with ascites: Secondary | ICD-10-CM

## 2021-11-03 DIAGNOSIS — I8510 Secondary esophageal varices without bleeding: Secondary | ICD-10-CM

## 2021-11-03 DIAGNOSIS — Z23 Encounter for immunization: Secondary | ICD-10-CM | POA: Diagnosis not present

## 2021-11-03 DIAGNOSIS — K7460 Unspecified cirrhosis of liver: Secondary | ICD-10-CM | POA: Diagnosis not present

## 2021-11-03 MED ORDER — RIFAXIMIN 550 MG PO TABS: 550.0000 mg | ORAL_TABLET | Freq: Two times a day (BID) | ORAL | 11 refills | Status: DC

## 2021-11-03 MED ORDER — NADOLOL 20 MG PO TABS
20.0000 mg | ORAL_TABLET | Freq: Every day | ORAL | 11 refills | Status: DC
Start: 2021-11-03 — End: 2021-12-08

## 2021-11-03 NOTE — Progress Notes (Signed)
?  ?[name] MD, MRCP(U.K) ?[address] [place] [place]  ?[address]  ?[place], [place] [address]  ?Main: [phone]  ?Fax: [phone] ? ? ?Primary Care Physician: Marguerita Merles, MD ? ?Primary Gastroenterologist:  Dr. [name]  ? ?Chief Complaint  ?Patient presents with  ? Cirrhosis  ? ? ?HPI: Keith Parker is a 45 y.o. male ? ?Summary of history : ?  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 ? ?Previously patient of Dr. Bonna Gains last seen in December 2022.He underwent an upper endoscopy on 07/13/2021 that showed white plaques in the entire esophagus no esophageal varices were seen.  He also underwent a colonoscopy on the same day and 2 polyps were found in the rectum that were resected.  Bowel prep was suboptimal brushings from the esophagus showed Candida.  And the polyps were adenomas.  He has history of polysubstance abuse chronic pain. Autoimmune screen was negative . ? ?09/07/2021 ultrasound abdomen showed moderate to high degree 8 echogenicity compatible with hepatic steatosis nodular liver contour compatible with cirrhosis mild gallbladder sludge Common bile duct size is 4 mm. ?  ?  ?He presented emergency room with swelling of the groin.  Continues to smoke and consume 1 alcoholic beverage per day.  History of withdrawal symptoms.  Found to have ascites on admission.  Commenced on Aldactone and Lasix.He says his abdomen started swelling up recently , has been taking 2 goody powders daily for a while. Denies any abdominal pain. No fever . No other complaints.  He underwent abdominal paracentesis ?09/22/2021-creatinine 0.42 albumin 1.9 AST 59 ALT 32 alkaline phosphatase 191 total bilirubin 1.4 ? ?  ?Interval history  10/10/2021-11/03/2021 ? ? ?10/10/2021: Ceruloplasmin - normal.  Ferritin lower at 511 and normal iron saturation , Hep C ab negative. Cr 0.55 ?10/13/2021: EGD: PHG noted, 2 columns of varices seen and 2 bands  placed.  ? ?He has lost over 30 pounds of weight.  All the swelling is gone down.  He still drinking 1 beer a day.  Continues to smoke.  On a low-salt diet.  Having difficulty sleeping at night but feels drowsy at times during the day.  Having regular bowel movements on a daily basis.  No constipation.  Denies any chest discomfort after banding of his varices. ? ? ?Current Outpatient Medications  ?Medication Sig Dispense Refill  ? buprenorphine (BUTRANS) 10 MCG/HR PTWK 1 patch once a week.    ? DULoxetine (CYMBALTA) 30 MG capsule Take 30 mg by mouth at bedtime.    ? furosemide (LASIX) 40 MG tablet Take 1 tablet (40 mg total) by mouth daily. 30 tablet 3  ? gabapentin (NEURONTIN) 300 MG capsule Take 300 mg by mouth 3 (three) times daily.    ? Potassium Chloride ER 20 MEQ TBCR Take 1 tablet by mouth daily.    ? spironolactone (ALDACTONE) 100 MG tablet Take 1 tablet (100 mg total) by mouth daily. 30 tablet 3  ? traZODone (DESYREL) 100 MG tablet Take 200 mg by mouth at bedtime.    ? ?No current facility-administered medications for this visit.  ? ? ?Allergies as of 11/03/2021  ? (No Known Allergies)  ? ? ?ROS: ? ?General: Negative for anorexia, weight loss, fever, chills, fatigue, weakness. ?ENT: Negative for hoarseness, difficulty swallowing , nasal congestion. ?CV: Negative for chest pain, angina, palpitations, dyspnea on exertion, peripheral edema.  ?Respiratory: Negative for dyspnea at rest,  dyspnea on exertion, cough, sputum, wheezing.  ?GI: See history of present illness. ?GU:  Negative for dysuria, hematuria, urinary incontinence, urinary frequency, nocturnal urination.  ?Endo: Negative for unusual weight change.  ?  ?Physical Examination: ? ? BP 133/76   Pulse (!) 130   Temp 98.7 ?F (37.1 ?C) (Oral)   Wt 108 lb 6.4 oz (49.2 kg)   BMI 20.48 kg/m?  ? ?General: Well-nourished, well-developed in no acute distress.  ?Eyes: No icterus. Conjunctivae pink. ?Abdomen: Bowel sounds are normal, nontender, nondistended,  no hepatosplenomegaly or masses, no abdominal bruits or hernia , no rebound or guarding.   ?Neuro: Alert and oriented x 3.  Grossly intact. ?Skin: Bruising over his arms ?Psych: Alert and cooperative, normal mood and affect. ? ? ?Imaging Studies: ?No results found. ? ?Assessment and Plan:  ? ?Keith Parker is a 45 y.o. y/o male with a history of chronic liver disease attributed to chronic alcohol abuse likely has cirrhosis with ascites and esophageal varices that have been banded and no history of bleeding.  Hospitalized in April 2023 for fluid accumulation and had paracentesis.  Doing very well on Aldactone and Lasix.  Probably he need to reduce the dose after he obtain his labs today as he has lost over 30 pounds of weight so far. ? ?  ?Plan ?1.  Low-salt diet, strongly advised to stop drinking alcohol and stop smoking. ?2.  RUQ USG to [facility] for [facility] in 02/2022 ?3.  No NSAID. ?4.  EGD to evaluate for esophageal varices in may 2023, nadolol commenced on 20 mg once a day .  ?5.  Had first dose of hep A B vaccine on 10/10/2021.  Continue to complete series.  Recommend pneumococcal vaccine as well ?6.  Stop all alcohol use strongly reiterated the importance of the same. ?7.  Advised to stop smoking ?8.  Colonoscopy 03/15/2022 due to suboptimal preparation ?9.   Continue Lasix 40 mg and Aldactone 100 mg .  We will closely watch his electrolytes as he has lost a lot of fluid.  We will recheck CMP in 3 weeks ? ? ?I have discussed alternative options, risks & benefits,  which include, but are not limited to, bleeding, infection, perforation,respiratory complication & drug reaction.  The patient agrees with this plan & written consent will be obtained.   ? ?  ? ?Dr [name]  MD,MRCP [facility]) ?Follow up in 6 weeks  ?

## 2021-11-03 NOTE — Patient Instructions (Signed)
Dr. Vicente Males would want to recheck your labs in 3 weeks (around 05/29-06/1). Please return to our office and have it drawn. ? ? ?

## 2021-11-03 NOTE — Progress Notes (Signed)
Patient was offered to get his Pneumococcal vaccine and he declined.- M. Ercie Eliasen, CMA ?

## 2021-11-04 ENCOUNTER — Other Ambulatory Visit: Payer: Self-pay

## 2021-11-04 ENCOUNTER — Encounter: Payer: Self-pay | Admitting: Gastroenterology

## 2021-11-04 LAB — COMPREHENSIVE METABOLIC PANEL
ALT: 29 IU/L (ref 0–44)
AST: 47 IU/L — ABNORMAL HIGH (ref 0–40)
Albumin/Globulin Ratio: 1 — ABNORMAL LOW (ref 1.2–2.2)
Albumin: 3.5 g/dL — ABNORMAL LOW (ref 4.0–5.0)
Alkaline Phosphatase: 337 IU/L — ABNORMAL HIGH (ref 44–121)
BUN/Creatinine Ratio: 10 (ref 9–20)
BUN: 6 mg/dL (ref 6–24)
Bilirubin Total: 0.5 mg/dL (ref 0.0–1.2)
CO2: 25 mmol/L (ref 20–29)
Calcium: 9.4 mg/dL (ref 8.7–10.2)
Chloride: 91 mmol/L — ABNORMAL LOW (ref 96–106)
Creatinine, Ser: 0.58 mg/dL — ABNORMAL LOW (ref 0.76–1.27)
Globulin, Total: 3.6 g/dL (ref 1.5–4.5)
Glucose: 108 mg/dL — ABNORMAL HIGH (ref 70–99)
Potassium: 4.5 mmol/L (ref 3.5–5.2)
Sodium: 134 mmol/L (ref 134–144)
Total Protein: 7.1 g/dL (ref 6.0–8.5)
eGFR: 120 mL/min/{1.73_m2} (ref >59)

## 2021-11-07 NOTE — Progress Notes (Signed)
Inform BMP normal - suggest to reduce dose of lasix to 20 mg a aday and aldactone to 50 mg a day , check weight in 1 week at office to ensure not gaining

## 2021-11-08 ENCOUNTER — Ambulatory Visit
Admission: RE | Admit: 2021-11-08 | Discharge: 2021-11-08 | Disposition: A | Discharge status: Home / Self Care | Payer: Medicare Other | Attending: Gastroenterology | Admitting: Gastroenterology

## 2021-11-08 ENCOUNTER — Encounter: Payer: Self-pay | Admitting: Gastroenterology

## 2021-11-08 ENCOUNTER — Ambulatory Visit: Payer: Medicare Other

## 2021-11-08 ENCOUNTER — Encounter
Admission: RE | Disposition: A | Discharge status: Home / Self Care | Payer: Self-pay | Source: Home / Self Care | Attending: Gastroenterology

## 2021-11-08 ENCOUNTER — Other Ambulatory Visit: Payer: Self-pay

## 2021-11-08 DIAGNOSIS — F419 Anxiety disorder, unspecified: Secondary | ICD-10-CM | POA: Diagnosis not present

## 2021-11-08 DIAGNOSIS — I8501 Esophageal varices with bleeding: Secondary | ICD-10-CM

## 2021-11-08 DIAGNOSIS — F17200 Nicotine dependence, unspecified, uncomplicated: Secondary | ICD-10-CM | POA: Diagnosis not present

## 2021-11-08 DIAGNOSIS — I10 Essential (primary) hypertension: Secondary | ICD-10-CM | POA: Insufficient documentation

## 2021-11-08 DIAGNOSIS — K766 Portal hypertension: Secondary | ICD-10-CM

## 2021-11-08 DIAGNOSIS — K7031 Alcoholic cirrhosis of liver with ascites: Secondary | ICD-10-CM

## 2021-11-08 DIAGNOSIS — Z09 Encounter for follow-up examination after completed treatment for conditions other than malignant neoplasm: Secondary | ICD-10-CM | POA: Diagnosis present

## 2021-11-08 DIAGNOSIS — F32A Depression, unspecified: Secondary | ICD-10-CM | POA: Insufficient documentation

## 2021-11-08 DIAGNOSIS — K219 Gastro-esophageal reflux disease without esophagitis: Secondary | ICD-10-CM | POA: Insufficient documentation

## 2021-11-08 DIAGNOSIS — K3189 Other diseases of stomach and duodenum: Secondary | ICD-10-CM

## 2021-11-08 DIAGNOSIS — K7460 Unspecified cirrhosis of liver: Secondary | ICD-10-CM | POA: Diagnosis not present

## 2021-11-08 HISTORY — PX: ESOPHAGOGASTRODUODENOSCOPY (EGD) WITH PROPOFOL: SHX5813

## 2021-11-08 SURGERY — ESOPHAGOGASTRODUODENOSCOPY (EGD) WITH PROPOFOL
Anesthesia: General

## 2021-11-08 MED ORDER — PROPOFOL 10 MG/ML IV BOLUS
INTRAVENOUS | Status: DC | PRN
  Administered 2021-11-08: 20 mg via INTRAVENOUS
  Administered 2021-11-08: 100 mg via INTRAVENOUS
  Administered 2021-11-08: 10 mg via INTRAVENOUS

## 2021-11-08 MED ORDER — LIDOCAINE HCL (CARDIAC) PF 100 MG/5ML IV SOSY
PREFILLED_SYRINGE | INTRAVENOUS | Status: DC | PRN
  Administered 2021-11-08: 40 mg via INTRAVENOUS

## 2021-11-08 MED ORDER — SODIUM CHLORIDE 0.9 % IV SOLN: INTRAVENOUS | Status: DC

## 2021-11-08 NOTE — Anesthesia Procedure Notes (Signed)
Procedure Name: [place] ?Date/Time: 11/08/2021 9:10 AM ?Performed by: Jerrye Noble, CRNA ?Pre-anesthesia Checklist: Patient identified, Emergency Drugs available, Suction available and Patient being monitored ?Patient Re-evaluated:Patient Re-evaluated prior to induction ?Oxygen Delivery Method: Nasal cannula ? ? ? ? ?

## 2021-11-08 NOTE — Anesthesia Postprocedure Evaluation (Signed)
Anesthesia Post Note ? ?Patient: TERIUS JACUINDE ? ?Procedure(s) Performed: ESOPHAGOGASTRODUODENOSCOPY (EGD) WITH PROPOFOL ? ?Patient location during evaluation: Endoscopy ?Anesthesia Type: General ?Level of consciousness: awake and alert ?Pain management: pain level controlled ?Vital Signs Assessment: post-procedure vital signs reviewed and stable ?Respiratory status: spontaneous breathing, nonlabored ventilation and respiratory function stable ?Cardiovascular status: blood pressure returned to baseline and stable ?Postop Assessment: no apparent nausea or vomiting ?Anesthetic complications: no ? ? ?No notable events documented. ? ? ?Last Vitals:  ?Vitals:  ? 11/08/21 0920 11/08/21 0929  ?BP: (!) 86/61 100/70  ?Pulse:    ?Temp: (!) 36 ?C   ?SpO2:    ?  ?Last Pain:  ?Vitals:  ? 11/08/21 0940  ?TempSrc:   ?PainSc: 0-No pain  ? ? ?  ?  ?  ?  ?  ?  ? ?[place] Ouch ? ? ? ? ?

## 2021-11-08 NOTE — Transfer of Care (Signed)
Immediate Anesthesia Transfer of Care Note ? ?Patient: Keith Parker ? ?Procedure(s) Performed: ESOPHAGOGASTRODUODENOSCOPY (EGD) WITH PROPOFOL ? ?Patient Location: PACU ? ?Anesthesia Type:MAC ? ?Level of Consciousness: awake, oriented, drowsy and patient cooperative ? ?Airway & Oxygen Therapy: Patient Spontanous Breathing and Patient connected to nasal cannula oxygen ? ?Post-op Assessment: Report given to RN, Post -op Vital signs reviewed and stable and Patient moving all extremities ? ?Post vital signs: Reviewed and stable ? ?Last Vitals:  ?Vitals Value Taken Time  ?BP 86/61 11/08/21 0921  ?Temp    ?Pulse 75 11/08/21 0921  ?Resp 16 11/08/21 0921  ?SpO2 100 % 11/08/21 0921  ?Vitals shown include unvalidated device data. ? ?Last Pain:  ?Vitals:  ? 11/08/21 0829  ?TempSrc: Temporal  ?PainSc: 0-No pain  ?   ? ?  ? ?Complications: No notable events documented. ?

## 2021-11-08 NOTE — Op Note (Signed)
[facility] ?Gastroenterology ?Patient Name: Keith Parker ?Procedure Date: 11/08/2021 9:09 AM ?MRN: [id number] ?Account #: [id number] ?Date of Birth: [birth year] ?Admit Type: Outpatient ?Age: 46 ?Room: [facility] ENDO ROOM 2 ?Gender: Male ?Note Status: Finalized ?Instrument Name: Upper Endoscope [id number] ?Procedure:             Upper GI endoscopy ?Indications:           Follow-up of esophageal varices with bleeding ?Providers:             [name] MD, MD ?Referring MD:          Marguerita Merles, MD (Referring MD) ?Medicines:             Monitored Anesthesia Care ?Complications:         No immediate complications. ?Procedure:             Pre-Anesthesia Assessment: ?                       - Prior to the procedure, a History and Physical was  ?                       performed, and patient medications, allergies and  ?                       sensitivities were reviewed. The patient's tolerance  ?                       of previous anesthesia was reviewed. ?                       - The risks and benefits of the procedure and the  ?                       sedation options and risks were discussed with the  ?                       patient. All questions were answered and informed  ?                       consent was obtained. ?                       - ASA Grade Assessment: III - A patient with severe  ?                       systemic disease. ?                       After obtaining informed consent, the endoscope was  ?                       passed under direct vision. Throughout the procedure,  ?                       the patient's blood pressure, pulse, and oxygen  ?                       saturations were monitored continuously. The Endoscope  ?  was introduced through the mouth, and advanced to the  ?                       third part of duodenum. The upper GI endoscopy was  ?                       accomplished with ease. The patient tolerated the  ?                       procedure  well. ?Findings: ?     The examined duodenum was normal. ?     Moderate portal hypertensive gastropathy was found in the entire  ?     examined stomach. ?     The esophagus was normal. ?     The cardia and gastric fundus were normal on retroflexion. ?Impression:            - Normal examined duodenum. ?                       - Portal hypertensive gastropathy. ?                       - Normal esophagus. ?                       - No specimens collected. ?Recommendation:        - Discharge patient to home (with escort). ?                       - Resume previous diet. ?                       - Continue present medications. ?                       - Repeat upper endoscopy in 6 months for surveillance. ?Procedure Code(s):     --- Professional --- ?                       [phone], Esophagogastroduodenoscopy, flexible,  ?                       transoral; diagnostic, including collection of  ?                       specimen(s) by brushing or washing, when performed  ?                       (separate procedure) ?Diagnosis Code(s):     --- Professional --- ?                       K76.6, Portal hypertension ?                       K31.89, Other diseases of stomach and duodenum ?                       I85.01, Esophageal varices with bleeding ?CPT copyright 2019 American Medical Association. All rights reserved. ?The codes documented in this report are preliminary and upon coder review may  ?be revised to meet current compliance requirements. ?[name], MD ?  [name] MD, MD ?11/08/2021 9:18:02 AM ?This report has been signed electronically. ?Number of Addenda: 0 ?Note Initiated On: 11/08/2021 9:09 AM ?Estimated Blood Loss:  Estimated blood loss: none. ?     [facility] ?

## 2021-11-08 NOTE — Anesthesia Preprocedure Evaluation (Addendum)
Anesthesia Evaluation  ?Patient identified by MRN, date of birth, ID band ?Patient awake ? ? ? ?Reviewed: ?Allergy & Precautions, NPO status , Patient's Chart, lab work & pertinent test results ? ?History of Anesthesia Complications ?Negative for: history of anesthetic complications ? ?Airway ?Mallampati: I ? ? ?Neck ROM: Full ? ? ? Dental ? ?(+) Dental Advidsory Given, Poor Dentition, Chipped, Missing,  ?Missing upper front teeth:   ?Pulmonary ?neg shortness of breath, neg sleep apnea, neg COPD, neg recent URI, Current SmokerPatient did not abstain from smoking.,  ?  ?Pulmonary exam normal ?breath sounds clear to auscultation ? ? ? ? ? ? Cardiovascular ?Exercise Tolerance: Good ?(-) angina(-) Past MI and (-) Cardiac Stents Normal cardiovascular exam(-) dysrhythmias (-) Valvular Problems/Murmurs ?Rhythm:Regular Rate:Normal ? ? ?  ?Neuro/Psych ?Seizures - (last sz 10 years ago),  PSYCHIATRIC DISORDERS Anxiety Depression   ? GI/Hepatic ?GERD  Controlled,(+) Cirrhosis  ? ascites ? substance abuse ? alcohol use,   ?Endo/Other  ?negative endocrine ROS ? Renal/GU ?negative Renal ROS  ? ?  ?Musculoskeletal ? ?(+) Arthritis ,  ? Abdominal ?Normal abdominal exam  (+)   ?Peds ? Hematology ?negative hematology ROS ?(+)   ?Anesthesia Other Findings ?Past Medical History: ?No date: Allergy ?No date: Arthritis ?No date: Depression ?10/29/2017: Elevated liver enzymes ?No date: GERD (gastroesophageal reflux disease) ?12/31/2017: Hearing loss, conductive, bilateral ?No date: Hypertension ?No date: Polio ?No date: Seizures ([place]) ?    Comment:  last seizure about 7 years ago.  ?No date: Substance abuse ([place]) ?    Comment:  Alcohol abuse ? ? Reproductive/Obstetrics ? ?  ? ? ? ? ? ? ? ? ? ? ? ? ? ?  ?  ? ? ? ? ? ? ? ?Anesthesia Physical ? ?Anesthesia Plan ? ?ASA: 3 ? ?Anesthesia Plan: General  ? ?Post-op Pain Management:   ? ?Induction: Intravenous ? ?PONV Risk Score and Plan: 1 and Propofol infusion, TIVA  and Treatment may vary due to age or medical condition ? ?Airway Management Planned: Natural Airway ? ?Additional Equipment:  ? ?Intra-op Plan:  ? ?Post-operative Plan:  ? ?Informed Consent: I have reviewed the patients History and Physical, chart, labs and discussed the procedure including the risks, benefits and alternatives for the proposed anesthesia with the patient or authorized representative who has indicated his/her understanding and acceptance.  ? ? ? ?Dental advisory given ? ?Plan Discussed with: CRNA and Anesthesiologist ? ?Anesthesia Plan Comments:   ? ? ? ? ? ?Anesthesia Quick Evaluation ? ?

## 2021-11-08 NOTE — H&P (Signed)
? ? ? ?[name], MD ?[address] [place], [place], [place], [address] ?[address] [place], [place], [place], [address] ?Phone: [phone]  ?Fax: [phone] ? ?Primary Care Physician:  Marguerita Merles, MD ? ? ?Pre-Procedure History & Physical: ?HPI:  PIKE SCANTLEBURY is a 46 y.o. male is here for an endoscopy  ?  ?Past Medical History:  ?Diagnosis Date  ? Allergy   ? Arthritis   ? Depression   ? Elevated liver enzymes 10/29/2017  ? GERD (gastroesophageal reflux disease)   ? Hearing loss, conductive, bilateral 12/31/2017  ? Hypertension   ? Polio   ? Seizures ([place])   ? last seizure about 7 years ago.   ? Substance abuse ([place])   ? Alcohol abuse  ? ? ?Past Surgical History:  ?Procedure Laterality Date  ? COLONOSCOPY WITH PROPOFOL N/A 07/13/2021  ? Procedure: COLONOSCOPY WITH PROPOFOL;  Surgeon: [name], MD;  Location: [facility] ENDOSCOPY;  Service: Gastroenterology;  Laterality: N/A;  ? ESOPHAGOGASTRODUODENOSCOPY N/A 07/13/2021  ? Procedure: ESOPHAGOGASTRODUODENOSCOPY (EGD);  Surgeon: [name], MD;  Location: [facility] ENDOSCOPY;  Service: Gastroenterology;  Laterality: N/A;  ? ESOPHAGOGASTRODUODENOSCOPY (EGD) WITH PROPOFOL N/A 10/13/2021  ? Procedure: ESOPHAGOGASTRODUODENOSCOPY (EGD) WITH PROPOFOL;  Surgeon: [name], MD;  Location: [facility] ENDOSCOPY;  Service: Gastroenterology;  Laterality: N/A;  ? LEG SURGERY  1988  ? 8 surgeries-hips and legs for walking after getting polio- last one 1988  ? WRIST SURGERY    ? right  ? ? ?Prior to Admission medications   ?Medication Sig Start Date End Date Taking? Authorizing Provider  ?DULoxetine (CYMBALTA) 30 MG capsule Take 30 mg by mouth at bedtime. 10/10/21  Yes [provider]  ?furosemide (LASIX) 40 MG tablet Take 1 tablet (40 mg total) by mouth daily. 09/23/21  Yes Dwyane Dee, MD  ?gabapentin (NEURONTIN) 300 MG capsule Take 300 mg by mouth 3 (three) times daily. 10/26/21  Yes [provider]  ?nadolol (CORGARD) 20 MG tablet Take 1 tablet (20 mg total) by  mouth daily. 11/03/21  Yes [name], MD  ?Potassium Chloride ER 20 MEQ TBCR Take 1 tablet by mouth daily. 10/26/21  Yes [provider]  ?rifaximin (XIFAXAN) 550 MG TABS tablet Take 1 tablet (550 mg total) by mouth 2 (two) times daily. 11/03/21  Yes [name], MD  ?spironolactone (ALDACTONE) 100 MG tablet Take 1 tablet (100 mg total) by mouth daily. 09/23/21  Yes Dwyane Dee, MD  ?traZODone (DESYREL) 100 MG tablet Take 200 mg by mouth at bedtime. 09/08/21  Yes [provider]  ?buprenorphine (BUTRANS) 10 MCG/HR PTWK 1 patch once a week. 09/27/21   [provider]  ? ? ?Allergies as of 11/03/2021  ? (No Known Allergies)  ? ? ?Family History  ?Problem Relation Age of Onset  ? Cancer Mother   ?     Breast  ? Arthritis Mother   ? Alcohol abuse Father   ? Arthritis Father   ? Hypertension Father   ? Cancer Maternal Grandmother   ?     Breast  ? Hypertension Maternal Grandmother   ? Hypertension Maternal Grandfather   ? Hypertension Paternal Grandmother   ? Hypertension Paternal Grandfather   ? Drug abuse Maternal Uncle   ? ? ?Social History  ? ?Socioeconomic History  ? Marital status: Single  ?  Spouse name: Not on file  ? Number of children: 2  ? Years of education: 28  ? Highest education level: Not on file  ?Occupational History  ?  Occupation: Disabled  ?  Comment: Post polio syndrome / Depression  ?Tobacco Use  ? Smoking status: Every Day  ?  Packs/day: 2.00  ?  Years: 25.00  ?  Pack years: 50.00  ?  Types: Cigarettes  ? Smokeless tobacco: Never  ?Vaping Use  ? Vaping Use: Never used  ?Substance and Sexual Activity  ? Alcohol use: Yes  ?  Alcohol/week: 70.0 standard drinks  ?  Types: 70 Cans of beer per week  ?  Comment: 1 40 ounces per day. 1 can beer yesterday  ? Drug use: No  ? Sexual activity: Not on file  ?Other Topics Concern  ? Not on file  ?Social History Narrative  ? Fun: Workout  ? Denies religious beliefs effecting health care.   ? ?Social Determinants of Health  ? ?Financial  Resource Strain: Not on file  ?Food Insecurity: Not on file  ?Transportation Needs: Not on file  ?Physical Activity: Not on file  ?Stress: Not on file  ?Social Connections: Not on file  ?Intimate Partner Violence: Not on file  ? ? ?Review of Systems: ?See HPI, otherwise negative ROS ? ?Physical Exam: ?BP 103/78   Pulse 69   Temp (!) 97 ?F (36.1 ?C) (Temporal)   Ht '5\' 1"'$  (1.549 m)   Wt 49 kg   SpO2 100%   BMI 20.41 kg/m?  ?General:   Alert,  pleasant and cooperative in NAD ?Head:  Normocephalic and atraumatic. ?Neck:  Supple; no masses or thyromegaly. ?Lungs:  Clear throughout to auscultation, normal respiratory effort.    ?Heart:  +S1, +S2, Regular rate and rhythm, No edema. ?Abdomen:  Soft, nontender and nondistended. Normal bowel sounds, without guarding, and without rebound.   ?Neurologic:  Alert and  oriented x4;  grossly normal neurologically. ? ?Impression/Plan: ?IMRAN NUON is here for an endoscopy  to be performed for  evaluation of esophageal varices ?   ?Risks, benefits, limitations, and alternatives regarding endoscopy have been reviewed with the patient.  Questions have been answered.  All parties agreeable. ? ? ?[name], MD  11/08/2021, 9:03 AM ? ?

## 2021-11-10 ENCOUNTER — Encounter: Payer: Self-pay | Admitting: Gastroenterology

## 2021-11-16 ENCOUNTER — Telehealth: Payer: Self-pay

## 2021-11-16 MED ORDER — FUROSEMIDE 20 MG PO TABS: 20.0000 mg | ORAL_TABLET | Freq: Every day | ORAL | 3 refills | Status: DC

## 2021-11-16 MED ORDER — SPIRONOLACTONE 50 MG PO TABS: 50.0000 mg | ORAL_TABLET | Freq: Every day | ORAL | 3 refills | Status: DC

## 2021-11-16 NOTE — Telephone Encounter (Signed)
-----   Message from [name], MD sent at 11/07/2021  8:24 AM EDT ----- Inform BMP normal - suggest to reduce dose of lasix to 20 mg a aday and aldactone to 50 mg a day , check weight in 1 week at office to ensure not gaining

## 2021-11-16 NOTE — Telephone Encounter (Signed)
Called patient to let him know that Dr. Vicente Males has recommendations due to his lab results. Patient was told that I would be sending his new prescriptions to his pharmacy and once he starts to take them, he needs to come to the office in a week for [place] to check his weight. Patient agreed and understood.

## 2021-11-30 ENCOUNTER — Other Ambulatory Visit: Payer: Self-pay

## 2021-12-08 ENCOUNTER — Encounter: Payer: Self-pay | Admitting: Gastroenterology

## 2021-12-08 ENCOUNTER — Ambulatory Visit (INDEPENDENT_AMBULATORY_CARE_PROVIDER_SITE_OTHER): Payer: Medicare Other | Admitting: Gastroenterology

## 2021-12-08 VITALS — BP 118/82 | HR 89 | Temp 98.1°F | Wt 113.0 lb

## 2021-12-08 DIAGNOSIS — K7460 Unspecified cirrhosis of liver: Secondary | ICD-10-CM | POA: Diagnosis not present

## 2021-12-08 DIAGNOSIS — K7031 Alcoholic cirrhosis of liver with ascites: Secondary | ICD-10-CM | POA: Diagnosis not present

## 2021-12-08 DIAGNOSIS — K7682 Hepatic encephalopathy: Secondary | ICD-10-CM

## 2021-12-08 DIAGNOSIS — I8510 Secondary esophageal varices without bleeding: Secondary | ICD-10-CM | POA: Diagnosis not present

## 2021-12-08 MED ORDER — NADOLOL 20 MG PO TABS: 20.0000 mg | ORAL_TABLET | Freq: Two times a day (BID) | ORAL | 0 refills | Status: DC

## 2021-12-08 NOTE — Addendum Note (Signed)
Addended by: Wayna Chalet on: 12/08/2021 03:09 PM   Modules accepted: Orders

## 2021-12-08 NOTE — Progress Notes (Signed)
[name] MD, MRCP(U.K) [address]  [place]  [place], [place] [address]  Main: [phone]  Fax: [phone]   Primary Care Physician: Marguerita Merles, MD  Primary Gastroenterologist:  Dr. [name]   Chief Complaint  Patient presents with   Cirrhosis    HPI: Keith Parker is a 46 y.o. 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  He underwent an upper endoscopy on 07/13/2021 that showed white plaques in the entire esophagus no esophageal varices were seen.  He also underwent a colonoscopy on the same day and 2 polyps were found in the rectum that were resected.  Bowel prep was suboptimal brushings from the esophagus showed Candida.  And the polyps were adenomas.  He has history of polysubstance abuse chronic pain. Autoimmune screen was negative .  09/07/2021 ultrasound abdomen showed moderate to high degree 8 echogenicity compatible with hepatic steatosis nodular liver contour compatible with cirrhosis mild gallbladder sludge Common bile duct size is 4 mm.     He presented emergency room with swelling of the groin.  Continues to smoke and consume 1 alcoholic beverage per day.  History of withdrawal symptoms.  Found to have ascites on admission.  Commenced on Aldactone and Lasix.He says his abdomen started swelling up recently , has been taking 2 goody powders daily for a while. Denies any abdominal pain. No fever . No other complaints.  He underwent abdominal paracentesis 09/22/2021-creatinine 0.42 albumin 1.9 AST 59 ALT 32 alkaline phosphatase 191 total bilirubin 1.4 10/10/2021: Ceruloplasmin - normal.  Ferritin lower at 511 and normal iron saturation , Hep C ab negative. Cr 0.55 10/13/2021: EGD: PHG noted, 2 columns of varices seen and 2 bands placed.     Interval history  11/03/2021-12/08/2021   11/03/2021: EGD: Moderate portal hypertensive  gastropathy was seen no esophageal varices that required banding was noted. Gained a few pounds.  Still drinking an occasional beer.  Taking his Xifaxan daily no issues with memory or difficulty sleeping no constipation no NSAID use no accumulation of fluid.    Current Outpatient Medications  Medication Sig Dispense Refill   buprenorphine (BUTRANS) 10 MCG/HR PTWK 1 patch once a week.     DULoxetine (CYMBALTA) 30 MG capsule Take 30 mg by mouth at bedtime.     furosemide (LASIX) 20 MG tablet Take 1 tablet (20 mg total) by mouth daily. 30 tablet 3   gabapentin (NEURONTIN) 300 MG capsule Take 300 mg by mouth 3 (three) times daily.     mirtazapine (REMERON) 7.5 MG tablet Take 7.5 mg by mouth at bedtime.     nadolol (CORGARD) 20 MG tablet Take 1 tablet (20 mg total) by mouth daily. 30 tablet 11   Potassium Chloride ER 20 MEQ TBCR Take 1 tablet by mouth daily.     spironolactone (ALDACTONE) 50 MG tablet Take 1 tablet (50 mg total) by mouth daily. 30 tablet 3   XIFAXAN 200 MG tablet Take 200 mg by mouth 2 (two) times daily.     No current facility-administered medications for this visit.    Allergies as of 12/08/2021   (No Known Allergies)    ROS:  General: Negative for anorexia, weight loss, fever, chills, fatigue, weakness. ENT: Negative for hoarseness, difficulty swallowing , nasal congestion. CV: Negative for chest pain, angina, palpitations, dyspnea on exertion, peripheral edema.  Respiratory: Negative for dyspnea at rest, dyspnea on exertion, cough, sputum, wheezing.  GI: See history of present illness. GU:  Negative for dysuria, hematuria, urinary incontinence, urinary frequency, nocturnal urination.  Endo: Negative for unusual weight change.    Physical Examination:   BP 118/82   Pulse 89   Temp 98.1 F (36.7 C) (Oral)   Wt 113 lb (51.3 kg)   BMI 21.35 kg/m   General: Well-nourished, well-developed in no acute distress.  Eyes: No icterus. Conjunctivae pink. Abdomen:  Bowel sounds are normal, nontender, nondistended, no hepatosplenomegaly or masses, no abdominal bruits or hernia , no rebound or guarding.   Extremities: No lower extremity edema. No clubbing or deformities. Neuro: Alert and oriented x 3.  Grossly intact. Skin: Warm and dry, no jaundice.   Psych: Alert and cooperative, normal mood and affect.   Imaging Studies: No results found.  Assessment and Plan:   Keith Parker is a 46 y.o. y/o male with a history of chronic liver disease attributed to chronic alcohol abuse likely has cirrhosis with ascites and esophageal varices that have been banded and no history of bleeding.  Hospitalized in April 2023 for fluid accumulation and had paracentesis.  Doing very well on Aldactone and Lasix.    Plan 1.  Low-salt diet, strongly advised to stop drinking alcohol and stop smoking. 2.  RUQ USG to scree for [facility] in 02/2022 3.  No NSAID. 4.  EGD to evaluate for esophageal varices in November 2023 , is on nadolol 20 mg once a day will increase to 20 mg twice a day advised if he has dizziness to reduce it back to 20 mg once a day 5.  Had first dose of hep A B vaccine on 10/10/2021.  Continue to complete series.  Recommend pneumococcal vaccine as well 6.  After today's labs if stable may stop diuretics 7.  Advised to stop smoking 8.  Colonoscopy 03/15/2022 due to suboptimal preparation 9.  Recheck CMP and INR  Dr [name]  MD,MRCP [facility]) Follow up in 71-month

## 2021-12-14 ENCOUNTER — Telehealth: Payer: Self-pay

## 2021-12-14 NOTE — Telephone Encounter (Signed)
Dr. Vicente Males patient called wanting to know how much of the Spironolactone, Furosemide and Nadolol he is needing to take. He wants to  know milligrams and times a day. The pharmacy gave him Spironolactone 100 and 50 MG, Furosemide 40 and 20 MG and Nadolol 20 MG once a day. Please advise so I could let hi know. Thank you.

## 2021-12-15 MED ORDER — NADOLOL 20 MG PO TABS: 20.0000 mg | ORAL_TABLET | Freq: Two times a day (BID) | ORAL | 3 refills | Status: DC

## 2021-12-15 MED ORDER — SPIRONOLACTONE 100 MG PO TABS: 100.0000 mg | ORAL_TABLET | Freq: Every day | ORAL | 3 refills | Status: DC

## 2022-01-01 ENCOUNTER — Encounter: Payer: Self-pay | Admitting: Radiology

## 2022-01-01 ENCOUNTER — Emergency Department: Payer: Medicare Other

## 2022-01-01 ENCOUNTER — Other Ambulatory Visit: Payer: Self-pay

## 2022-01-01 ENCOUNTER — Emergency Department
Admission: EM | Admit: 2022-01-01 | Discharge: 2022-01-01 | Disposition: A | Discharge status: Home / Self Care | Payer: Medicare Other | Attending: Emergency Medicine | Admitting: Emergency Medicine

## 2022-01-01 DIAGNOSIS — R0602 Shortness of breath: Secondary | ICD-10-CM | POA: Insufficient documentation

## 2022-01-01 DIAGNOSIS — R079 Chest pain, unspecified: Secondary | ICD-10-CM | POA: Diagnosis present

## 2022-01-01 LAB — CBC WITH DIFFERENTIAL/PLATELET
Abs Immature Granulocytes: 0.02 10*3/uL (ref 0.00–0.07)
Basophils Absolute: 0.1 10*3/uL (ref 0.0–0.1)
Basophils Relative: 1 %
Eosinophils Absolute: 0.1 10*3/uL (ref 0.0–0.5)
Eosinophils Relative: 1 %
HCT: 41.3 % (ref 39.0–52.0)
Hemoglobin: 13.8 g/dL (ref 13.0–17.0)
Immature Granulocytes: 0 %
Lymphocytes Relative: 38 %
Lymphs Abs: 3.5 10*3/uL (ref 0.7–4.0)
MCH: 31.7 pg (ref 26.0–34.0)
MCHC: 33.4 g/dL (ref 30.0–36.0)
MCV: 94.9 fL (ref 80.0–100.0)
Monocytes Absolute: 0.6 10*3/uL (ref 0.1–1.0)
Monocytes Relative: 6 %
Neutro Abs: 4.9 10*3/uL (ref 1.7–7.7)
Neutrophils Relative %: 54 %
Platelets: 145 10*3/uL — ABNORMAL LOW (ref 150–400)
RBC: 4.35 MIL/uL (ref 4.22–5.81)
RDW: 14.4 % (ref 11.5–15.5)
WBC: 9.2 10*3/uL (ref 4.0–10.5)
nRBC: 0 % (ref 0.0–0.2)

## 2022-01-01 LAB — TROPONIN I (HIGH SENSITIVITY)
Troponin I (High Sensitivity): 7 ng/L (ref <18)
Troponin I (High Sensitivity): 6 ng/L (ref <18)

## 2022-01-01 LAB — BASIC METABOLIC PANEL
Anion gap: 11 (ref 5–15)
BUN: <5 mg/dL — ABNORMAL LOW (ref 6–20)
CO2: 22 mmol/L (ref 22–32)
Calcium: 8.2 mg/dL — ABNORMAL LOW (ref 8.9–10.3)
Chloride: 105 mmol/L (ref 98–111)
Creatinine, Ser: 0.47 mg/dL — ABNORMAL LOW (ref 0.61–1.24)
GFR, Estimated: >60 mL/min (ref >60)
Glucose, Bld: 98 mg/dL (ref 70–99)
Potassium: 4 mmol/L (ref 3.5–5.1)
Sodium: 138 mmol/L (ref 135–145)

## 2022-01-01 MED ORDER — HYDROXYZINE HCL 25 MG PO TABS: 25.0000 mg | ORAL_TABLET | Freq: Three times a day (TID) | ORAL | 0 refills | Status: AC | PRN

## 2022-01-01 NOTE — ED Notes (Signed)
Pt unplugged for toilet

## 2022-01-01 NOTE — ED Notes (Signed)
DC ppw provided to patient. PT verbalized understand of RX information. Pt provides verbal consent for dc at this time. Pt ambulatory off unit on foot.

## 2022-01-01 NOTE — ED Triage Notes (Signed)
Pt coming from home at 5pm with L Chest pressure. Tingling in L arm as well. Pt was given 3 nitro, with relief. [place]. 18G in R AC placed PTA. ST enroute, denies any caridiac history. PMH polio and anxiety. Pt arrives  alert and oriented at this time.

## 2022-01-01 NOTE — ED Notes (Signed)
Pt asking to go home. Pt IV removed. MD into see patient.

## 2022-01-01 NOTE — ED Provider Notes (Signed)
[facility] Provider Note    Event Date/Time   First MD Initiated Contact with Patient 01/01/22 1842     (approximate)   History   Chest Pain   HPI  Keith Parker is a 46 y.o. male  who presents to the emergency department today because of concerns for chest pain.  Located in his left chest.  Felt like his heart was racing.  Accompanied by shortness of breath and sweating.  Patient says that he has history of panic attacks and thinks that might have been what this was.  Patient also has history of chronic pain and states he has been out of his morphine for roughly 1 week. Feels better at the time of my exam.     Physical Exam   Triage Vital Signs: ED Triage Vitals  Enc Vitals Group     BP 01/01/22 1816 119/73     Pulse Rate 01/01/22 1816 (!) 102     Resp 01/01/22 1816 13     Temp 01/01/22 1816 97.9 F (36.6 C)     Temp src --      SpO2 01/01/22 1813 96 %     Weight 01/01/22 1816 112 lb 6.4 oz (51 kg)     Height --      Head Circumference --      Peak Flow --      Pain Score 01/01/22 1816 5     Pain Loc --      Pain Edu? --      Excl. in [place]? --     Most recent vital signs: Vitals:   01/01/22 1816 01/01/22 1830  BP: 119/73 109/69  Pulse: (!) 102 99  Resp: 13 19  Temp: 97.9 F (36.6 C)   SpO2: 98% 97%    General: Awake, alert, oriented. CV:  Good peripheral perfusion. Regular rate and rhythm. Resp:  Normal effort.  Lungs clear. Abd:  No distention.   ED Results / Procedures / Treatments   Labs (all labs ordered are listed, but only abnormal results are displayed) Labs Reviewed  CBC WITH DIFFERENTIAL/PLATELET - Abnormal; Notable for the following components:      Result Value   Platelets 145 (*)    All other components within normal limits  BASIC METABOLIC PANEL - Abnormal; Notable for the following components:   BUN <5 (*)    Creatinine, Ser 0.47 (*)    Calcium 8.2 (*)    All other components within normal limits  TROPONIN I  (HIGH SENSITIVITY)  TROPONIN I (HIGH SENSITIVITY)     EKG  I, Nance Pear, attending physician, personally viewed and interpreted this EKG  EKG Time: 1815 Rate: 100 Rhythm: sinus tachycardia Axis: normal Intervals: qtc 445 QRS: narrow, q waves v1, v2 ST changes: no st elevation Impression: abnormal ekg   RADIOLOGY I independently interpreted and visualized the CXR. My interpretation: No pneumonia. No pneumothorax.  Radiology interpretation:  IMPRESSION:  No evidence of acute cardiopulmonary disease.      PROCEDURES:  Critical Care performed: No  Procedures   MEDICATIONS ORDERED IN ED: Medications - No data to display   IMPRESSION / MDM / [place] / ED COURSE  I reviewed the triage vital signs and the nursing notes.                              Differential diagnosis includes, but is not limited to, ACS,  pneumonia, pneumothorax.  Patient's presentation is most consistent with acute presentation with potential threat to life or bodily function.  Patient presented to the emergency department today because of concerns for chest pain.  At the time my exam patient states he feels better.  Also worried it might be an anxiety.  Final was negative.  Emergency department.  Chest x-ray without pneumonia or pneumothorax.  I doubt PE or dissection.  Do think it is possible pain was related to anxiety.  Will give patient prescription for Atarax in case that symptoms happen again to see if they help.  FINAL CLINICAL IMPRESSION(S) / ED DIAGNOSES   Final diagnoses:  Nonspecific chest pain     Note:  This document was prepared using Dragon voice recognition software and may include unintentional dictation errors.    Nance Pear, MD 01/01/22 [phone]

## 2022-01-01 NOTE — Discharge Instructions (Signed)
Please seek medical attention for any high fevers, chest pain, shortness of breath, change in behavior, persistent vomiting, bloody stool or any other new or concerning symptoms.  

## 2022-01-01 NOTE — ED Notes (Signed)
Pt and visitor stating that patient does not have access to their Morphine patch and has been wearing an old patch, pt asking if they might be able to get a new one. This Rn informed patient that they would have  to discuss with the provider when they are seen.

## 2022-02-02 ENCOUNTER — Emergency Department
Admission: EM | Admit: 2022-02-02 | Discharge: 2022-02-02 | Disposition: A | Discharge status: Home / Self Care | Payer: Medicare Other | Attending: Emergency Medicine | Admitting: Emergency Medicine

## 2022-02-02 ENCOUNTER — Emergency Department: Payer: Medicare Other

## 2022-02-02 ENCOUNTER — Encounter: Payer: Self-pay | Admitting: Emergency Medicine

## 2022-02-02 DIAGNOSIS — W228XXA Striking against or struck by other objects, initial encounter: Secondary | ICD-10-CM | POA: Insufficient documentation

## 2022-02-02 DIAGNOSIS — S99921A Unspecified injury of right foot, initial encounter: Secondary | ICD-10-CM | POA: Diagnosis present

## 2022-02-02 DIAGNOSIS — F17210 Nicotine dependence, cigarettes, uncomplicated: Secondary | ICD-10-CM | POA: Diagnosis not present

## 2022-02-02 DIAGNOSIS — Y93J1 Activity, piano playing: Secondary | ICD-10-CM | POA: Diagnosis not present

## 2022-02-02 DIAGNOSIS — I10 Essential (primary) hypertension: Secondary | ICD-10-CM | POA: Insufficient documentation

## 2022-02-02 DIAGNOSIS — S9031XA Contusion of right foot, initial encounter: Secondary | ICD-10-CM | POA: Diagnosis not present

## 2022-02-02 MED ORDER — KETOROLAC TROMETHAMINE 15 MG/ML IJ SOLN
15.0000 mg | Freq: Once | INTRAMUSCULAR | Status: AC
  Administered 2022-02-02: 15 mg via INTRAMUSCULAR
  Filled 2022-02-02: qty 1

## 2022-02-02 NOTE — ED Provider Notes (Signed)
[facility] Provider Note    Event Date/Time   First MD Initiated Contact with Patient 02/02/22 2047     (approximate)   History   Foot Injury   HPI  Keith Parker is a 46 y.o. male with a past medical history of polio, chronic pain with buprenorphine patch, hypertension, anxiety, controlled substance agreement signed, who presents today for evaluation of right foot pain.  He reports that a piano tipped over and hit his foot.  He did not fall to the ground.  He is still able to ambulate.  He reports that he still had pain 24 hours later so he came for evaluation.  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   Elevated liver enzymes 10/29/2017   Heavy cigarette smoker 07/04/2017   Elevated blood pressure reading without diagnosis of hypertension 07/04/2017   Generalized anxiety disorder 02/01/2015   Seasonal allergies 02/01/2015   Rash and nonspecific skin eruption 02/01/2015   Alcohol abuse 02/01/2015   Chronic, continuous use of opioids 01/27/2014   Controlled substance agreement signed 11/03/2013   Abnormal loss of weight 08/06/2013   Attention or concentration deficit 08/06/2013   Chronic pain 08/06/2013   Tobacco use disorder 08/06/2013   Nondependent cannabis abuse 06/09/2013   Personality disorder ([place]) 06/09/2013   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   Insomnia 01/31/2013   Backache 01/31/2013   Depressive disorder 01/31/2013   Nondependent alcohol abuse, in remission 01/31/2013   Pain in joint, pelvic region and thigh 01/31/2013          Physical Exam   Triage Vital Signs: ED Triage Vitals  Enc Vitals Group     BP 02/02/22 1912 (!) 136/92     Pulse Rate 02/02/22 1912 83     Resp  02/02/22 1912 18     Temp 02/02/22 1912 98.4 F (36.9 C)     Temp Source 02/02/22 1912 Oral     SpO2 02/02/22 1912 100 %     Weight 02/02/22 1910 125 lb (56.7 kg)     Height 02/02/22 1910 '5\' 1"'$  (1.549 m)     Head Circumference --      Peak Flow --      Pain Score 02/02/22 1910 6     Pain Loc --      Pain Edu? --      Excl. in [place]? --     Most recent vital signs: Vitals:   02/02/22 1912  BP: (!) 136/92  Pulse: 83  Resp: 18  Temp: 98.4 F (36.9 C)  SpO2: 100%    Physical Exam Vitals and nursing note reviewed.  Constitutional:      General: Awake and alert. No acute distress.    Appearance: Normal appearance. The patient is normal weight.  HENT:     Head: Normocephalic and atraumatic.     Mouth: Mucous membranes are moist.  Eyes:     General: PERRL. Normal EOMs        Right eye: No discharge.        Left eye: No discharge.     Conjunctiva/sclera: Conjunctivae normal.  Cardiovascular:     Rate and Rhythm: Normal rate and regular rhythm.     Pulses: Normal pulses.  Heart sounds: Normal heart sounds Pulmonary:     Effort: Pulmonary effort is normal. No respiratory distress.  Abdominal:     Abdomen is soft. There is no abdominal tenderness.  Musculoskeletal:        General: No swelling. Normal range of motion.     Cervical back: Normal range of motion and neck supple.  Right foot: No open wounds, abrasion, ecchymosis, or erythema.  Able to wiggle all toes normally.  Able to flex and extend toes normally against resistance without pain.  2+ bounding pedal pulse.  Foot is warm and well-perfused.  No tenderness to the ankle, lower extremity, knee, or hip.  Sensation intact light touch throughout. Skin:    General: Skin is warm and dry.     Capillary Refill: Capillary refill takes less than 2 seconds.     Findings: No rash.  No open wounds Neurological:     Mental Status: The patient is awake and alert.      ED Results / Procedures / Treatments   Labs (all labs  ordered are listed, but only abnormal results are displayed) Labs Reviewed - No data to display   EKG     RADIOLOGY I independently reviewed and interpreted imaging and agree with radiologists findings.     PROCEDURES:  Critical Care performed:   Procedures   MEDICATIONS ORDERED IN ED: Medications  ketorolac (TORADOL) 15 MG/ML injection 15 mg (has no administration in time range)     IMPRESSION / MDM / ASSESSMENT AND PLAN / ED COURSE  I reviewed the triage vital signs and the nursing notes.   Differential diagnosis includes, but is not limited to, contusion, fracture, less likely dislocation.  Patient is awake and alert, hemodynamically stable and neurovascularly intact.  X-ray was obtained and demonstrates no acute osseous injury.  His foot is warm and well-perfused.  He has normal pulses.  His foot is normal temperature, sensation intact.  No pain out of proportion.  Do not suspect compartment syndrome.  He was given a shot of Toradol.  He was given a cam boot for extra support.  We discussed return precautions and outpatient follow-up.  He is able to ambulate unassisted.  He was discharged in stable condition with his family member.   Patient's presentation is most consistent with acute complicated illness / injury requiring diagnostic workup.    FINAL CLINICAL IMPRESSION(S) / ED DIAGNOSES   Final diagnoses:  Injury of right foot, initial encounter  Contusion of right foot, initial encounter     Rx / DC Orders   ED Discharge Orders     None        Note:  This document was prepared using Dragon voice recognition software and may include unintentional dictation errors.   Emeline Gins 02/02/22 2113    Merlyn Lot, MD 02/03/22 [phone]

## 2022-02-02 NOTE — ED Provider Triage Note (Signed)
Emergency Medicine Provider Triage Evaluation Note  Keith Parker , a 46 y.o. male  was evaluated in triage.  Pt complains of right lateral foot pain.  Yesterday dropped a PNO on his right foot.  Initially had some pain and swelling but was able to bear weight.  Today unable to bear weight.  History of polio.  Pain located along the lateral aspect of the right foot.  No ankle pain.  No skin breakdown noted.  Review of Systems  Positive: Right foot pain  Negative: Skin breakdown, numbness, tingling   Physical Exam  Ht '5\' 1"'$  (1.549 m)   Wt 56.7 kg   BMI 23.62 kg/m  Gen:   Awake, no distress   Resp:  Normal effort  MSK:   Moves extremities without difficulty  Other:    Medical Decision Making  Medically screening exam initiated at 7:10 PM.  Appropriate orders placed.  Keith Parker was informed that the remainder of the evaluation will be completed by another provider, this initial triage assessment does not replace that evaluation, and the importance of remaining in the ED until their evaluation is complete.     Duanne Guess, [place] 02/02/22 1911

## 2022-02-02 NOTE — Discharge Instructions (Signed)
Rest, ice, elevate your foot.  Wear the boot during the day, take it off when you sleep at night to reduce your risk for developing a blood clot.  Please return for any new, worsening, or change in symptoms or other concerns including worsening pain, numbness, tingling, foot becomes cold, or any other concerns.  It was a pleasure caring for you today.

## 2022-02-02 NOTE — ED Triage Notes (Signed)
Pt presents via POV with complaints of right foot pain after dropping a piano on his foot yesterday. Pt able to bear weight on the foot after the incident but is experiencing more pain starting today. Pt able to flex his toes. No erythema noted.

## 2022-02-16 ENCOUNTER — Other Ambulatory Visit: Payer: Self-pay | Admitting: Gastroenterology

## 2022-04-11 ENCOUNTER — Ambulatory Visit: Payer: Medicare Other | Admitting: Gastroenterology

## 2022-05-15 ENCOUNTER — Other Ambulatory Visit: Payer: Self-pay

## 2022-05-15 ENCOUNTER — Ambulatory Visit: Payer: Medicare Other | Admitting: Gastroenterology

## 2022-05-15 DIAGNOSIS — K7460 Unspecified cirrhosis of liver: Secondary | ICD-10-CM | POA: Diagnosis not present

## 2022-05-15 DIAGNOSIS — I8510 Secondary esophageal varices without bleeding: Secondary | ICD-10-CM

## 2022-05-15 DIAGNOSIS — K7682 Hepatic encephalopathy: Secondary | ICD-10-CM | POA: Diagnosis not present

## 2022-05-15 DIAGNOSIS — Z23 Encounter for immunization: Secondary | ICD-10-CM

## 2022-05-15 DIAGNOSIS — K7031 Alcoholic cirrhosis of liver with ascites: Secondary | ICD-10-CM

## 2022-05-15 DIAGNOSIS — Z1211 Encounter for screening for malignant neoplasm of colon: Secondary | ICD-10-CM

## 2022-05-15 MED ORDER — NADOLOL 20 MG PO TABS: 20.0000 mg | ORAL_TABLET | Freq: Two times a day (BID) | ORAL | 6 refills | Status: DC

## 2022-05-15 MED ORDER — NA SULFATE-K SULFATE-MG SULF 17.5-3.13-1.6 GM/177ML PO SOLN: 354.0000 mL | Freq: Once | ORAL | 0 refills | Status: AC

## 2022-05-15 NOTE — Progress Notes (Signed)
[name] MD, MRCP(U.K) [address]  [place]  [place], [place] [address]  Main: [phone]  Fax: [phone]   Primary Care Physician: Marguerita Merles, MD  Primary Gastroenterologist:  Dr. [name]   Chief Complaint  Patient presents with   Cirrhosis of the liver    HPI: Keith Parker is a 46 y.o. 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  April 2023 esophageal varices banded nonbleeding,   He also underwent a colonoscopy on the same day and 2 polyps were found in the rectum that were resected.  Bowel prep was suboptimal ,  polyps were adenomas.  He has history of polysubstance abuse chronic pain. Autoimmune screen was negative .  History of NSAID use that he stopped and ongoing alcohol use.Keith Parker  History of abdominal ascites requiring abdominal paracentesis   09/22/2021-creatinine 0.42 albumin 1.9 AST 59 ALT 32 alkaline phosphatase 191 total bilirubin 1.4 10/10/2021: Ceruloplasmin - normal.  Ferritin lower at 511 and normal iron saturation , Hep C ab negative. Cr 0.55 10/13/2021: EGD: PHG noted, 2 columns of varices seen and 2 bands placed.   11/03/2021: EGD: Moderate portal hypertensive gastropathy was seen no esophageal varices that required banding was noted.    Interval history  12/08/2021-05/15/2022   01/01/2022 hemoglobin 13.8 g platelets 145 creatinine 0.47 Weight stable since last visit pulse 69 bpm taking nadolol 20 mg twice daily.  Taking Aldactone 100 mg a day and Lasix 20 mg a day.  Diet is still high in sodium.  No NSAIDs.  Having difficulty sleeping at night on Xifaxan twice a day denies any constipation has a bowel movement soft every day.  Continues to drink 1 drink of alcohol a day     Current Outpatient Medications  Medication Sig Dispense Refill   buprenorphine (BUTRANS) 10 MCG/HR PTWK 1 patch once a week.     cyclobenzaprine  (FEXMID) 7.5 MG tablet Take 7.5 mg by mouth 3 (three) times daily as needed.     diclofenac (CATAFLAM) 50 MG tablet Take by mouth daily.     DULoxetine (CYMBALTA) 30 MG capsule Take 30 mg by mouth at bedtime.     furosemide (LASIX) 40 MG tablet Take 40 mg by mouth daily.     gabapentin (NEURONTIN) 300 MG capsule Take 300 mg by mouth 3 (three) times daily.     hydrOXYzine (ATARAX) 25 MG tablet Take 1 tablet (25 mg total) by mouth every 8 (eight) hours as needed for anxiety. 15 tablet 0   nadolol (CORGARD) 20 MG tablet Take 1 tablet (20 mg total) by mouth in the morning and at bedtime. Please take Nadolol 20 MG in the morning and 40 MG in the evening. Total of 3 tablets per day. 90 tablet 6   NUCYNTA 50 MG tablet Take 50 mg by mouth 2 (two) times daily as needed.     Potassium Chloride ER 20 MEQ TBCR Take 1 tablet by mouth daily.     spironolactone (ALDACTONE) 100 MG tablet Take 1 tablet (100 mg total) by mouth daily. 90 tablet 3   traZODone (DESYREL) 100 MG tablet Take 200 mg by mouth at bedtime as needed.     XIFAXAN 550 MG TABS tablet Take 550 mg by mouth 2 (two) times daily.     Na Sulfate-K Sulfate-Mg Sulf 17.5-3.13-1.6 GM/177ML SOLN Take  354 mLs by mouth once for 1 dose. Starting at 5 PM take one bottle and pour into the supplied cup, add cool water to the fill 16 oz line and drink all. Then 5 hours before procedure pour the second bottle into the supplied cup, add cool water to the fill 16 oz line and drink all. 354 mL 0   No current facility-administered medications for this visit.    Allergies as of 05/15/2022   (No Known Allergies)    ROS:  General: Negative for anorexia, weight loss, fever, chills, fatigue, weakness. ENT: Negative for hoarseness, difficulty swallowing , nasal congestion. CV: Negative for chest pain, angina, palpitations, dyspnea on exertion, peripheral edema.  Respiratory: Negative for dyspnea at rest, dyspnea on exertion, cough, sputum, wheezing.  GI: See history  of present illness. GU:  Negative for dysuria, hematuria, urinary incontinence, urinary frequency, nocturnal urination.  Endo: Negative for unusual weight change.    Physical Examination:   There were no vitals taken for this visit.  General: Well-nourished, well-developed in no acute distress.  Eyes: No icterus. Conjunctivae pink. Abdomen: Bowel sounds are normal, nontender, nondistended, no hepatosplenomegaly or masses, no abdominal bruits or hernia , no rebound or guarding.   Extremities: No lower extremity edema. No clubbing or deformities. Neuro: Alert and oriented x 3.  Grossly intact. Skin: Warm and dry, no jaundice.   Psych: Alert and cooperative, normal mood and affect.   Imaging Studies: No results found.  Assessment and Plan:   Keith Parker is a 46 y.o. y/o male with a history of chronic liver disease attributed to chronic alcohol abuse likely has cirrhosis with ascites and esophageal varices that have been banded and no history of bleeding.  Hospitalized in April 2023 for fluid accumulation and had paracentesis.  Doing very well on Aldactone and Lasix.  MELD sodium 11 in April 2023     Plan 1.  Low-salt diet, strongly advised to stop drinking alcohol and stop smoking. 2.  RUQ USG to scree for [facility] overdue, check AFP 3.  No NSAID. 4.  EGD to evaluate for esophageal varices in November 2023 which he is due .  Taking nadolol 20 mg twice daily pulse is still 69 bpm advised to increase it to 20 mg in the morning and 40 mg at night.  Advised if he has any dizziness to cut back on the dose to 20 mg twice daily 5.  Had first dose of hep A B vaccine on 10/10/2021.  Continue to complete series.  Recommend pneumococcal vaccine as well 6.  Advised to stop smoking 8.  Colonoscopy 03/15/2022 due to suboptimal preparation 9.  Recheck CMP and INR  I have discussed alternative options, risks & benefits,  which include, but are not limited to, bleeding, infection, perforation,respiratory  complication & drug reaction.  The patient agrees with this plan & written consent will be obtained.     Dr [name]  MD,MRCP [facility]) Follow up in 6 months

## 2022-05-15 NOTE — Patient Instructions (Addendum)
Please do not forget to come in and have your blood pressure and pulse checked next week.  Your ultrasound is scheduled for 05/19/2022 at 10:15 AM. Nothing to eat or drink after midnight the night before. If you would like to change your appointment, please contact [phone].

## 2022-05-16 ENCOUNTER — Telehealth: Payer: Self-pay

## 2022-05-16 DIAGNOSIS — I8510 Secondary esophageal varices without bleeding: Secondary | ICD-10-CM

## 2022-05-16 LAB — CBC WITH DIFFERENTIAL/PLATELET
Basophils Absolute: 0.1 10*3/uL (ref 0.0–0.2)
Basos: 1 %
EOS (ABSOLUTE): 0.3 10*3/uL (ref 0.0–0.4)
Eos: 3 %
Hematocrit: 47 % (ref 37.5–51.0)
Hemoglobin: 16.4 g/dL (ref 13.0–17.7)
Immature Grans (Abs): 0 10*3/uL (ref 0.0–0.1)
Immature Granulocytes: 0 %
Lymphocytes Absolute: 3.4 10*3/uL — ABNORMAL HIGH (ref 0.7–3.1)
Lymphs: 29 %
MCH: 33.1 pg — ABNORMAL HIGH (ref 26.6–33.0)
MCHC: 34.9 g/dL (ref 31.5–35.7)
MCV: 95 fL (ref 79–97)
Monocytes Absolute: 1.1 10*3/uL — ABNORMAL HIGH (ref 0.1–0.9)
Monocytes: 10 %
Neutrophils Absolute: 6.6 10*3/uL (ref 1.4–7.0)
Neutrophils: 57 %
Platelets: 192 10*3/uL (ref 150–450)
RBC: 4.96 x10E6/uL (ref 4.14–5.80)
RDW: 12.8 % (ref 11.6–15.4)
WBC: 11.6 10*3/uL — ABNORMAL HIGH (ref 3.4–10.8)

## 2022-05-16 LAB — AFP TUMOR MARKER: AFP, Serum, Tumor Marker: 2.2 ng/mL (ref 0.0–6.9)

## 2022-05-16 LAB — COMPREHENSIVE METABOLIC PANEL
ALT: 20 IU/L (ref 0–44)
AST: 26 IU/L (ref 0–40)
Albumin/Globulin Ratio: 1.5 (ref 1.2–2.2)
Albumin: 4.4 g/dL (ref 4.1–5.1)
Alkaline Phosphatase: 120 IU/L (ref 44–121)
BUN/Creatinine Ratio: 16 (ref 9–20)
BUN: 12 mg/dL (ref 6–24)
Bilirubin Total: 0.4 mg/dL (ref 0.0–1.2)
CO2: 26 mmol/L (ref 20–29)
Calcium: 10.4 mg/dL — ABNORMAL HIGH (ref 8.7–10.2)
Chloride: 96 mmol/L (ref 96–106)
Creatinine, Ser: 0.77 mg/dL (ref 0.76–1.27)
Globulin, Total: 2.9 g/dL (ref 1.5–4.5)
Glucose: 104 mg/dL — ABNORMAL HIGH (ref 70–99)
Potassium: 5.5 mmol/L — ABNORMAL HIGH (ref 3.5–5.2)
Sodium: 135 mmol/L (ref 134–144)
Total Protein: 7.3 g/dL (ref 6.0–8.5)
eGFR: 110 mL/min/{1.73_m2} (ref >59)

## 2022-05-16 LAB — PROTIME-INR
INR: 1 (ref 0.9–1.2)
Prothrombin Time: 10.6 s (ref 9.1–12.0)

## 2022-05-16 MED ORDER — ALDACTONE 50 MG PO TABS: 50.0000 mg | ORAL_TABLET | Freq: Every day | ORAL | 1 refills | Status: DC

## 2022-05-16 NOTE — Telephone Encounter (Signed)
Patient has been informed his potassium is elevated. He has been  advise to hold aladactone for 3 days then restart at 50 mg a day and repeat BMP in 7 days.  New rx has been sen to pts [facility] order pharmacy [facility].  Lab order entered for 11/28.  Pt verbalized understanding.  Thanks, [place], [place]

## 2022-05-19 ENCOUNTER — Ambulatory Visit
Admission: RE | Admit: 2022-05-19 | Discharge: 2022-05-19 | Disposition: A | Discharge status: Home / Self Care | Payer: Medicare Other | Source: Ambulatory Visit | Attending: Gastroenterology | Admitting: Gastroenterology

## 2022-05-19 DIAGNOSIS — K7031 Alcoholic cirrhosis of liver with ascites: Secondary | ICD-10-CM | POA: Insufficient documentation

## 2022-05-19 DIAGNOSIS — K7460 Unspecified cirrhosis of liver: Secondary | ICD-10-CM | POA: Diagnosis present

## 2022-05-19 DIAGNOSIS — Z23 Encounter for immunization: Secondary | ICD-10-CM | POA: Diagnosis present

## 2022-05-19 DIAGNOSIS — K7682 Hepatic encephalopathy: Secondary | ICD-10-CM | POA: Insufficient documentation

## 2022-05-19 DIAGNOSIS — I8510 Secondary esophageal varices without bleeding: Secondary | ICD-10-CM | POA: Diagnosis present

## 2022-05-22 ENCOUNTER — Ambulatory Visit (INDEPENDENT_AMBULATORY_CARE_PROVIDER_SITE_OTHER): Payer: Self-pay | Admitting: Gastroenterology

## 2022-05-22 VITALS — BP 134/87 | HR 71

## 2022-05-22 DIAGNOSIS — K7031 Alcoholic cirrhosis of liver with ascites: Secondary | ICD-10-CM

## 2022-05-23 ENCOUNTER — Telehealth: Payer: Self-pay

## 2022-05-23 DIAGNOSIS — K7031 Alcoholic cirrhosis of liver with ascites: Secondary | ICD-10-CM

## 2022-05-23 DIAGNOSIS — E875 Hyperkalemia: Secondary | ICD-10-CM

## 2022-05-23 MED ORDER — SPIRONOLACTONE 50 MG PO TABS: 50.0000 mg | ORAL_TABLET | Freq: Every day | ORAL | 6 refills | Status: DC

## 2022-05-23 NOTE — Telephone Encounter (Signed)
Called patient to let him know that his pharmacy[facility] did not have his Aldactone 50 MG since they are backed order. Therefore, I let Dr. Vicente Males and patient know that I need to send the prescription to his local pharmacy. Patient stated that it was okay to send it to [facility] on [address]. I also let the patient know that he needs his labs to be drawn today and he stated that he did not have money for gas until Friday but that he will call around to ask if someone could bring him for his blood drawn. Order was placed.

## 2022-05-24 LAB — COMPREHENSIVE METABOLIC PANEL
ALT: 20 IU/L (ref 0–44)
AST: 26 IU/L (ref 0–40)
Albumin/Globulin Ratio: 1.4 (ref 1.2–2.2)
Albumin: 4 g/dL — ABNORMAL LOW (ref 4.1–5.1)
Alkaline Phosphatase: 103 IU/L (ref 44–121)
BUN/Creatinine Ratio: 12 (ref 9–20)
BUN: 8 mg/dL (ref 6–24)
Bilirubin Total: <0.2 mg/dL (ref 0.0–1.2)
CO2: 21 mmol/L (ref 20–29)
Calcium: 9.2 mg/dL (ref 8.7–10.2)
Chloride: 99 mmol/L (ref 96–106)
Creatinine, Ser: 0.69 mg/dL — ABNORMAL LOW (ref 0.76–1.27)
Globulin, Total: 2.9 g/dL (ref 1.5–4.5)
Glucose: 122 mg/dL — ABNORMAL HIGH (ref 70–99)
Potassium: 4.1 mmol/L (ref 3.5–5.2)
Sodium: 135 mmol/L (ref 134–144)
Total Protein: 6.9 g/dL (ref 6.0–8.5)
eGFR: 114 mL/min/{1.73_m2} (ref >59)

## 2022-05-24 LAB — PROTIME-INR
INR: 1 (ref 0.9–1.2)
Prothrombin Time: 10.5 s (ref 9.1–12.0)

## 2022-05-31 NOTE — Progress Notes (Unsigned)
ERROR

## 2022-06-05 NOTE — Progress Notes (Signed)
Repeat cmp in 2-3 weeks

## 2022-06-06 ENCOUNTER — Telehealth: Payer: Self-pay

## 2022-06-06 DIAGNOSIS — R748 Abnormal levels of other serum enzymes: Secondary | ICD-10-CM

## 2022-06-06 NOTE — Telephone Encounter (Signed)
Called patient to let him know what his blood work results were and that Dr. Vicente Males wanted him to have a check on his labs again in 2-3 weeks. Patient stated that he will come in.

## 2022-06-06 NOTE — Telephone Encounter (Signed)
-----   Message from [name], MD sent at 06/05/2022 10:44 AM EST ----- Repeat cmp in 2-3 weeks

## 2022-06-15 ENCOUNTER — Ambulatory Visit: Payer: Medicare Other | Admitting: Anesthesiology

## 2022-06-15 ENCOUNTER — Ambulatory Visit
Admission: RE | Admit: 2022-06-15 | Discharge: 2022-06-15 | Disposition: A | Discharge status: Home / Self Care | Payer: Medicare Other | Attending: Gastroenterology | Admitting: Gastroenterology

## 2022-06-15 ENCOUNTER — Other Ambulatory Visit: Payer: Self-pay

## 2022-06-15 ENCOUNTER — Encounter: Payer: Self-pay | Admitting: Gastroenterology

## 2022-06-15 ENCOUNTER — Encounter
Admission: RE | Disposition: A | Discharge status: Home / Self Care | Payer: Self-pay | Source: Home / Self Care | Attending: Gastroenterology

## 2022-06-15 DIAGNOSIS — K219 Gastro-esophageal reflux disease without esophagitis: Secondary | ICD-10-CM | POA: Insufficient documentation

## 2022-06-15 DIAGNOSIS — K7460 Unspecified cirrhosis of liver: Secondary | ICD-10-CM | POA: Diagnosis not present

## 2022-06-15 DIAGNOSIS — F418 Other specified anxiety disorders: Secondary | ICD-10-CM | POA: Diagnosis not present

## 2022-06-15 DIAGNOSIS — Z1211 Encounter for screening for malignant neoplasm of colon: Secondary | ICD-10-CM | POA: Diagnosis not present

## 2022-06-15 DIAGNOSIS — I8510 Secondary esophageal varices without bleeding: Secondary | ICD-10-CM | POA: Diagnosis not present

## 2022-06-15 DIAGNOSIS — I10 Essential (primary) hypertension: Secondary | ICD-10-CM | POA: Diagnosis not present

## 2022-06-15 DIAGNOSIS — K5730 Diverticulosis of large intestine without perforation or abscess without bleeding: Secondary | ICD-10-CM | POA: Diagnosis not present

## 2022-06-15 DIAGNOSIS — Z8249 Family history of ischemic heart disease and other diseases of the circulatory system: Secondary | ICD-10-CM | POA: Diagnosis not present

## 2022-06-15 DIAGNOSIS — R569 Unspecified convulsions: Secondary | ICD-10-CM | POA: Diagnosis not present

## 2022-06-15 DIAGNOSIS — F17210 Nicotine dependence, cigarettes, uncomplicated: Secondary | ICD-10-CM | POA: Insufficient documentation

## 2022-06-15 HISTORY — PX: COLONOSCOPY: SHX5424

## 2022-06-15 HISTORY — PX: ESOPHAGOGASTRODUODENOSCOPY (EGD) WITH PROPOFOL: SHX5813

## 2022-06-15 SURGERY — ESOPHAGOGASTRODUODENOSCOPY (EGD) WITH PROPOFOL
Anesthesia: General

## 2022-06-15 MED ORDER — PROPOFOL 500 MG/50ML IV EMUL
INTRAVENOUS | Status: DC | PRN
  Administered 2022-06-15: 150 ug/kg/min via INTRAVENOUS

## 2022-06-15 MED ORDER — SODIUM CHLORIDE 0.9 % IV SOLN: INTRAVENOUS | Status: DC

## 2022-06-15 MED ORDER — PROPOFOL 1000 MG/100ML IV EMUL
INTRAVENOUS | Status: AC
  Filled 2022-06-15: qty 100

## 2022-06-15 MED ORDER — LIDOCAINE HCL (CARDIAC) PF 100 MG/5ML IV SOSY
PREFILLED_SYRINGE | INTRAVENOUS | Status: DC | PRN
  Administered 2022-06-15: 50 mg via INTRAVENOUS

## 2022-06-15 MED ORDER — DEXMEDETOMIDINE HCL IN NACL 80 MCG/20ML IV SOLN
INTRAVENOUS | Status: DC | PRN
  Administered 2022-06-15: 4 ug via BUCCAL

## 2022-06-15 MED ORDER — PROPOFOL 10 MG/ML IV BOLUS
INTRAVENOUS | Status: DC | PRN
  Administered 2022-06-15: 70 mg via INTRAVENOUS
  Administered 2022-06-15: 60 mg via INTRAVENOUS
  Administered 2022-06-15: 30 mg via INTRAVENOUS

## 2022-06-15 NOTE — Anesthesia Preprocedure Evaluation (Addendum)
Anesthesia Evaluation  Patient identified by MRN, date of birth, ID band Patient awake    Reviewed: Allergy & Precautions, NPO status , Patient's Chart, lab work & pertinent test results  Airway Mallampati: II  TM Distance: >3 FB Neck ROM: full    Dental  (+) Poor Dentition, Missing, Dental Advisory Given   Pulmonary neg pulmonary ROS, COPD, Current SmokerPatient did not abstain from smoking.   Pulmonary exam normal  + decreased breath sounds      Cardiovascular Exercise Tolerance: Good hypertension, Pt. on medications negative cardio ROS Normal cardiovascular exam Rhythm:Regular Rate:Normal     Neuro/Psych Seizures -,   Anxiety Depression    negative neurological ROS  negative psych ROS   GI/Hepatic negative GI ROS, Neg liver ROS,GERD  Medicated,,  Endo/Other  negative endocrine ROS    Renal/GU negative Renal ROS  negative genitourinary   Musculoskeletal   Abdominal   Peds negative pediatric ROS (+)  Hematology negative hematology ROS (+) Blood dyscrasia, anemia   Anesthesia Other Findings Past Medical History: No date: Allergy No date: Arthritis No date: Depression 10/29/2017: Elevated liver enzymes No date: GERD (gastroesophageal reflux disease) 12/31/2017: Hearing loss, conductive, bilateral No date: Hypertension No date: Polio No date: Seizures ([place])     Comment:  last seizure about 7 years ago.  No date: Substance abuse ([place])     Comment:  Alcohol abuse  Past Surgical History: 07/13/2021: COLONOSCOPY WITH PROPOFOL; N/A     Comment:  Procedure: COLONOSCOPY WITH PROPOFOL;  Surgeon: [name], MD;  Location: [facility] ENDOSCOPY;  Service:               Gastroenterology;  Laterality: N/A; 07/13/2021: ESOPHAGOGASTRODUODENOSCOPY; N/A     Comment:  Procedure: ESOPHAGOGASTRODUODENOSCOPY (EGD);  Surgeon:               [name], MD;  Location: [facility] ENDOSCOPY;  Service:                Gastroenterology;  Laterality: N/A; 10/13/2021: ESOPHAGOGASTRODUODENOSCOPY (EGD) WITH PROPOFOL; N/A     Comment:  Procedure: ESOPHAGOGASTRODUODENOSCOPY (EGD) WITH               PROPOFOL;  Surgeon: [name], MD;  Location: [facility]               ENDOSCOPY;  Service: Gastroenterology;  Laterality: N/A; 11/08/2021: ESOPHAGOGASTRODUODENOSCOPY (EGD) WITH PROPOFOL; N/A     Comment:  Procedure: ESOPHAGOGASTRODUODENOSCOPY (EGD) WITH               PROPOFOL;  Surgeon: [name], MD;  Location: [facility]               ENDOSCOPY;  Service: Gastroenterology;  Laterality: N/A; 1988: LEG SURGERY     Comment:  8 surgeries-hips and legs for walking after getting               polio- last one 1988 No date: WRIST SURGERY     Comment:  right     Reproductive/Obstetrics negative OB ROS                             Anesthesia Physical Anesthesia Plan  ASA: 3  Anesthesia Plan: General   Post-op Pain Management:    Induction: Intravenous  PONV Risk Score and Plan: Propofol infusion and TIVA  Airway Management Planned: Natural Airway  Additional Equipment:  Intra-op Plan:   Post-operative Plan:   Informed Consent: I have reviewed the patients History and Physical, chart, labs and discussed the procedure including the risks, benefits and alternatives for the proposed anesthesia with the patient or authorized representative who has indicated his/her understanding and acceptance.     Dental Advisory Given  Plan Discussed with: CRNA and Surgeon  Anesthesia Plan Comments:        Anesthesia Quick Evaluation

## 2022-06-15 NOTE — H&P (Signed)
[name], MD [address] [place], [place], [place], [address] [address], [place], [place], [place], [address] Phone: [phone]  Fax: [phone]  Primary Care Physician:  [name], MD   Pre-Procedure History & Physical: HPI:  Keith Parker is a 46 y.o. male is here for an endoscopy and colonoscopy    Past Medical History:  Diagnosis Date   Allergy    Arthritis    Depression    Elevated liver enzymes 10/29/2017   GERD (gastroesophageal reflux disease)    Hearing loss, conductive, bilateral 12/31/2017   Hypertension    Polio    Seizures ([place])    last seizure about 7 years ago.    Substance abuse ([place])    Alcohol abuse    Past Surgical History:  Procedure Laterality Date   COLONOSCOPY WITH PROPOFOL N/A 07/13/2021   Procedure: COLONOSCOPY WITH PROPOFOL;  Surgeon: [name], MD;  Location: [facility] ENDOSCOPY;  Service: Gastroenterology;  Laterality: N/A;   ESOPHAGOGASTRODUODENOSCOPY N/A 07/13/2021   Procedure: ESOPHAGOGASTRODUODENOSCOPY (EGD);  Surgeon: [name], MD;  Location: [facility] ENDOSCOPY;  Service: Gastroenterology;  Laterality: N/A;   ESOPHAGOGASTRODUODENOSCOPY (EGD) WITH PROPOFOL N/A 10/13/2021   Procedure: ESOPHAGOGASTRODUODENOSCOPY (EGD) WITH PROPOFOL;  Surgeon: [name], MD;  Location: [facility] ENDOSCOPY;  Service: Gastroenterology;  Laterality: N/A;   ESOPHAGOGASTRODUODENOSCOPY (EGD) WITH PROPOFOL N/A 11/08/2021   Procedure: ESOPHAGOGASTRODUODENOSCOPY (EGD) WITH PROPOFOL;  Surgeon: [name], MD;  Location: [facility] ENDOSCOPY;  Service: Gastroenterology;  Laterality: N/A;   LEG SURGERY  1988   8 surgeries-hips and legs for walking after getting polio- last one Elias-Fela Solis     right    Prior to Admission medications   Medication Sig Start Date End Date Taking? Authorizing Provider  diclofenac (CATAFLAM) 50 MG tablet Take by mouth daily. 03/30/22  Yes [provider]  gabapentin (NEURONTIN) 300 MG capsule Take 300 mg by mouth 3 (three)  times daily. 10/26/21  Yes [provider]  hydrOXYzine (ATARAX) 25 MG tablet Take 1 tablet (25 mg total) by mouth every 8 (eight) hours as needed for anxiety. 01/01/22  Yes Nance Pear, MD  nadolol (CORGARD) 20 MG tablet Take 1 tablet (20 mg total) by mouth in the morning and at bedtime. Please take Nadolol 20 MG in the morning and 40 MG in the evening. Total of 3 tablets per day. 05/15/22  Yes [name], MD  nadolol (CORGARD) 20 MG tablet Take 20 mg by mouth daily.   Yes [provider]  buprenorphine (BUTRANS) 10 MCG/HR PTWK 1 patch once a week. 09/27/21   [provider]  cyclobenzaprine (FEXMID) 7.5 MG tablet Take 7.5 mg by mouth 3 (three) times daily as needed. 03/30/22   [provider]  DULoxetine (CYMBALTA) 30 MG capsule Take 30 mg by mouth at bedtime. 10/10/21   [provider]  furosemide (LASIX) 40 MG tablet Take 40 mg by mouth daily. 01/10/22   [provider]  NUCYNTA 50 MG tablet Take 50 mg by mouth 2 (two) times daily as needed. 02/09/22   [provider]  Potassium Chloride ER 20 MEQ TBCR Take 1 tablet by mouth daily. 10/26/21   [provider]  spironolactone (ALDACTONE) 50 MG tablet Take 1 tablet (50 mg total) by mouth daily. 05/23/22   [name], MD  traZODone (DESYREL) 100 MG tablet Take 200 mg by mouth at bedtime as needed. 05/09/22   [provider]  XIFAXAN 550 MG TABS tablet Take 550  mg by mouth 2 (two) times daily. 05/09/22   [provider]    Allergies as of 05/15/2022   (No Known Allergies)    Family History  Problem Relation Age of Onset   Cancer Mother        Breast   Arthritis Mother    Alcohol abuse Father    Arthritis Father    Hypertension Father    Cancer Maternal Grandmother        Breast   Hypertension Maternal Grandmother    Hypertension Maternal Grandfather    Hypertension Paternal Grandmother    Hypertension Paternal Grandfather    Drug abuse Maternal Uncle      Social History   Socioeconomic History   Marital status: Single    Spouse name: Not on file   Number of children: 2   Years of education: 12   Highest education level: Not on file  Occupational History   Occupation: Disabled    Comment: Post polio syndrome / Depression  Tobacco Use   Smoking status: Every Day    Packs/day: 2.00    Years: 25.00    Total pack years: 50.00    Types: Cigarettes   Smokeless tobacco: Never  Vaping Use   Vaping Use: Never used  Substance and Sexual Activity   Alcohol use: Yes    Alcohol/week: 70.0 standard drinks of alcohol    Types: 70 Cans of beer per week    Comment: 2 days ago   Drug use: No   Sexual activity: Not on file  Other Topics Concern   Not on file  Social History Narrative   Fun: Workout   Denies religious beliefs effecting health care.    Social Determinants of Health   Financial Resource Strain: Not on file  Food Insecurity: Not on file  Transportation Needs: Not on file  Physical Activity: Not on file  Stress: Not on file  Social Connections: Not on file  Intimate Partner Violence: Not on file    Review of Systems: See HPI, otherwise negative ROS  Physical Exam: BP (!) 118/92   Pulse 86   Temp (!) 97.2 F (36.2 C) (Temporal)   Resp 20   Ht '5\' 1"'$  (1.549 m)   Wt 53.6 kg   SpO2 99%   BMI 22.33 kg/m  General:   Alert,  pleasant and cooperative in NAD Head:  Normocephalic and atraumatic. Neck:  Supple; no masses or thyromegaly. Lungs:  Clear throughout to auscultation, normal respiratory effort.    Heart:  +S1, +S2, Regular rate and rhythm, No edema. Abdomen:  Soft, nontender and nondistended. Normal bowel sounds, without guarding, and without rebound.   Neurologic:  Alert and  oriented x4;  grossly normal neurologically.  Impression/Plan: Keith Parker is here for an endoscopy and colonoscopy  to be performed for  evaluation of esophageal varices and colon cancer screening     Risks, benefits,  limitations, and alternatives regarding endoscopy have been reviewed with the patient.  Questions have been answered.  All parties agreeable.   [name], MD  06/15/2022, 7:40 AM

## 2022-06-15 NOTE — Op Note (Signed)
[facility] Gastroenterology Patient Name: Keith Parker Procedure Date: 06/15/2022 7:48 AM MRN: [id number] Account #: [id number] Date of Birth: May 30, 1976 Admit Type: Outpatient Age: 46 Room: [facility] ENDO ROOM 3 Gender: Male Note Status: Finalized Instrument Name: Park Meo [id number] Procedure:             Colonoscopy Indications:           Screening for colorectal malignant neoplasm,                         inadequate bowel prep on last colonoscopy (more recent                         than 10 years ago) Providers:             [name] MD, MD Referring MD:          No Local Md, MD (Referring MD) Medicines:             Monitored Anesthesia Care Complications:         No immediate complications. Procedure:             Pre-Anesthesia Assessment:                        - Prior to the procedure, a History and Physical was                         performed, and patient medications, allergies and                         sensitivities were reviewed. The patient's tolerance                         of previous anesthesia was reviewed.                        - ASA Grade Assessment: II - A patient with mild                         systemic disease.                        After obtaining informed consent, the colonoscope was                         passed under direct vision. Throughout the procedure,                         the patient's blood pressure, pulse, and oxygen                         saturations were monitored continuously. The                         Colonoscope was introduced through the anus and                         advanced to the the cecum, identified by the                         appendiceal orifice.  The colonoscopy was performed                         with ease. The patient tolerated the procedure well.                         The quality of the bowel preparation was excellent.                         The ileocecal valve, appendiceal orifice, and rectum                          were photographed. Findings:      The perianal and digital rectal examinations were normal.      Multiple medium-mouthed diverticula were found in the sigmoid colon.      The exam was otherwise without abnormality on direct and retroflexion       views. Impression:            - Diverticulosis in the sigmoid colon.                        - The examination was otherwise normal on direct and                         retroflexion views.                        - No specimens collected. Recommendation:        - Discharge patient to home (with escort).                        - Resume previous diet.                        - Continue present medications.                        - Repeat colonoscopy in 5 years for surveillance. Procedure Code(s):     --- Professional ---                        [phone], Colonoscopy, flexible; diagnostic, including                         collection of specimen(s) by brushing or washing, when                         performed (separate procedure) Diagnosis Code(s):     --- Professional ---                        Z12.11, Encounter for screening for malignant neoplasm                         of colon                        K57.30, Diverticulosis of large intestine without                         perforation or abscess without bleeding CPT copyright  2022 American Medical Association. All rights reserved. The codes documented in this report are preliminary and upon coder review may  be revised to meet current compliance requirements. [name], MD [name] MD, MD 06/15/2022 8:14:06 AM This report has been signed electronically. Number of Addenda: 0 Note Initiated On: 06/15/2022 7:48 AM Scope Withdrawal Time: 0 hours 10 minutes 11 seconds  Total Procedure Duration: 0 hours 13 minutes 16 seconds  Estimated Blood Loss:  Estimated blood loss: none.      [facility]

## 2022-06-15 NOTE — Anesthesia Postprocedure Evaluation (Signed)
Anesthesia Post Note  Patient: Keith Parker  Procedure(s) Performed: ESOPHAGOGASTRODUODENOSCOPY (EGD) WITH PROPOFOL COLONOSCOPY  Patient location during evaluation: PACU Anesthesia Type: General Level of consciousness: awake Pain management: pain level controlled Vital Signs Assessment: post-procedure vital signs reviewed and stable Respiratory status: spontaneous breathing Cardiovascular status: stable Anesthetic complications: no  No notable events documented.   Last Vitals:  Vitals:   06/15/22 0827 06/15/22 0837  BP: (!) 135/98 (!) 133/95  Pulse: 77   Resp: 16   Temp:    SpO2: 100%     Last Pain:  Vitals:   06/15/22 0827  TempSrc:   PainSc: 0-No pain                 VAN STAVEREN,Senai Ramnath

## 2022-06-15 NOTE — Transfer of Care (Signed)
Immediate Anesthesia Transfer of Care Note  Patient: Keith Parker  Procedure(s) Performed: ESOPHAGOGASTRODUODENOSCOPY (EGD) WITH PROPOFOL COLONOSCOPY  Patient Location: Endoscopy Unit  Anesthesia Type:General  Level of Consciousness: awake, alert , and oriented  Airway & Oxygen Therapy: Patient Spontanous Breathing  Post-op Assessment: Report given to RN and Post -op Vital signs reviewed and stable  Post vital signs: Reviewed and stable  Last Vitals:  Vitals Value Taken Time  BP 82/54 06/15/22 0817  Temp 36.2 C 06/15/22 0814  Pulse 85 06/15/22 0817  Resp 19 06/15/22 0817  SpO2 100 % 06/15/22 0817    Last Pain:  Vitals:   06/15/22 0814  TempSrc: Temporal  PainSc: 0-No pain         Complications: No notable events documented.

## 2022-06-15 NOTE — Op Note (Signed)
[facility] Gastroenterology Patient Name: Keith Parker Procedure Date: 06/15/2022 7:48 AM MRN: [id number] Account #: [id number] Date of Birth: 1976/04/09 Admit Type: Outpatient Age: 46 Room: [facility] ENDO ROOM 3 Gender: Male Note Status: Finalized Instrument Name: Upper Endoscope [id number] Procedure:             Upper GI endoscopy Indications:           Cirrhosis rule out esophageal varices Providers:             [name] MD, MD Referring MD:          No Local Md, MD (Referring MD) Medicines:             Monitored Anesthesia Care Complications:         No immediate complications. Procedure:             Pre-Anesthesia Assessment:                        - Prior to the procedure, a History and Physical was                         performed, and patient medications, allergies and                         sensitivities were reviewed. The patient's tolerance                         of previous anesthesia was reviewed.                        - The risks and benefits of the procedure and the                         sedation options and risks were discussed with the                         patient. All questions were answered and informed                         consent was obtained.                        - ASA Grade Assessment: II - A patient with mild                         systemic disease.                        After obtaining informed consent, the endoscope was                         passed under direct vision. Throughout the procedure,                         the patient's blood pressure, pulse, and oxygen                         saturations were monitored continuously. The Endoscope  was introduced through the mouth, and advanced to the                         third part of duodenum. The upper GI endoscopy was                         accomplished with ease. The patient tolerated the                         procedure well. Findings:      The  esophagus was normal.      The stomach was normal.      The examined duodenum was normal. Impression:            - Normal esophagus.                        - Normal stomach.                        - Normal examined duodenum.                        - No specimens collected. Recommendation:        - Discharge patient to home (with escort).                        - Resume previous diet.                        - Continue present medications. Procedure Code(s):     --- Professional ---                        [phone], Esophagogastroduodenoscopy, flexible,                         transoral; diagnostic, including collection of                         specimen(s) by brushing or washing, when performed                         (separate procedure) Diagnosis Code(s):     --- Professional ---                        K74.60, Unspecified cirrhosis of liver CPT copyright 2022 American Medical Association. All rights reserved. The codes documented in this report are preliminary and upon coder review may  be revised to meet current compliance requirements. [name], MD [name] MD, MD 06/15/2022 7:57:34 AM This report has been signed electronically. Number of Addenda: 0 Note Initiated On: 06/15/2022 7:48 AM Estimated Blood Loss:  Estimated blood loss: none.      [facility]

## 2022-06-15 NOTE — Anesthesia Procedure Notes (Signed)
Date/Time: 06/15/2022 7:47 AM  Performed by: Johnna Acosta, CRNAPre-anesthesia Checklist: Patient identified, Emergency Drugs available, Timeout performed, Suction available and Patient being monitored Patient Re-evaluated:Patient Re-evaluated prior to induction Oxygen Delivery Method: Nasal cannula Preoxygenation: Pre-oxygenation with 100% oxygen Induction Type: IV induction

## 2022-12-06 ENCOUNTER — Other Ambulatory Visit: Payer: Self-pay | Admitting: Gastroenterology

## 2023-01-15 ENCOUNTER — Telehealth: Payer: Self-pay | Admitting: Gastroenterology

## 2023-01-15 NOTE — Telephone Encounter (Signed)
Patient called in because his feet is swelling, and he want Dr. Tobi Bastos to look at it. Patient needs to speak with the nurse. I called him back letting him that I sent a message to the nurse.

## 2023-01-16 NOTE — Telephone Encounter (Signed)
Called patient back and he stated that he had been having some swelling on his feet. But he stated that for about a week and a half he was not taking his medications because he had left them at his provious residence and for him not to get in trouble, he just left them and had to wait to get a refill. However, he stated that he was able to get his medications again by his pharmacy. He stated that he wanted to be seen by Dr. Tobi Bastos so he could check his labs and checks about his liver. He stated that he wanted an appointment until after August 2nd, so appointment was scheduled to be on 08*/10/2022 at 1:15 PM. Patient agreed and had no further question. I told him to give [place] a call in case he had and questions or concerns.

## 2023-01-29 ENCOUNTER — Ambulatory Visit: Payer: Medicare Other | Admitting: Gastroenterology

## 2023-03-05 ENCOUNTER — Emergency Department: Payer: Medicare Other

## 2023-03-05 ENCOUNTER — Inpatient Hospital Stay
Admission: EM | Admit: 2023-03-05 | Discharge: 2023-03-14 | DRG: 871 | Disposition: A | Discharge status: Home / Self Care | Payer: Medicare Other | Attending: Osteopathic Medicine | Admitting: Osteopathic Medicine

## 2023-03-05 DIAGNOSIS — I2510 Atherosclerotic heart disease of native coronary artery without angina pectoris: Secondary | ICD-10-CM | POA: Diagnosis present

## 2023-03-05 DIAGNOSIS — R6520 Severe sepsis without septic shock: Secondary | ICD-10-CM | POA: Diagnosis present

## 2023-03-05 DIAGNOSIS — Z8249 Family history of ischemic heart disease and other diseases of the circulatory system: Secondary | ICD-10-CM | POA: Diagnosis not present

## 2023-03-05 DIAGNOSIS — Z8261 Family history of arthritis: Secondary | ICD-10-CM

## 2023-03-05 DIAGNOSIS — J189 Pneumonia, unspecified organism: Secondary | ICD-10-CM | POA: Diagnosis not present

## 2023-03-05 DIAGNOSIS — H900 Conductive hearing loss, bilateral: Secondary | ICD-10-CM | POA: Diagnosis present

## 2023-03-05 DIAGNOSIS — E43 Unspecified severe protein-calorie malnutrition: Secondary | ICD-10-CM | POA: Insufficient documentation

## 2023-03-05 DIAGNOSIS — K219 Gastro-esophageal reflux disease without esophagitis: Secondary | ICD-10-CM | POA: Diagnosis present

## 2023-03-05 DIAGNOSIS — Z681 Body mass index (BMI) 19 or less, adult: Secondary | ICD-10-CM | POA: Diagnosis not present

## 2023-03-05 DIAGNOSIS — J984 Other disorders of lung: Secondary | ICD-10-CM

## 2023-03-05 DIAGNOSIS — K7031 Alcoholic cirrhosis of liver with ascites: Secondary | ICD-10-CM | POA: Diagnosis present

## 2023-03-05 DIAGNOSIS — F1020 Alcohol dependence, uncomplicated: Secondary | ICD-10-CM | POA: Diagnosis present

## 2023-03-05 DIAGNOSIS — R634 Abnormal weight loss: Secondary | ICD-10-CM | POA: Diagnosis present

## 2023-03-05 DIAGNOSIS — K5900 Constipation, unspecified: Secondary | ICD-10-CM | POA: Diagnosis not present

## 2023-03-05 DIAGNOSIS — R911 Solitary pulmonary nodule: Secondary | ICD-10-CM | POA: Diagnosis present

## 2023-03-05 DIAGNOSIS — G14 Postpolio syndrome: Secondary | ICD-10-CM | POA: Diagnosis present

## 2023-03-05 DIAGNOSIS — F419 Anxiety disorder, unspecified: Secondary | ICD-10-CM | POA: Diagnosis present

## 2023-03-05 DIAGNOSIS — J188 Other pneumonia, unspecified organism: Secondary | ICD-10-CM | POA: Diagnosis present

## 2023-03-05 DIAGNOSIS — A419 Sepsis, unspecified organism: Secondary | ICD-10-CM | POA: Diagnosis present

## 2023-03-05 DIAGNOSIS — J948 Other specified pleural conditions: Secondary | ICD-10-CM | POA: Insufficient documentation

## 2023-03-05 DIAGNOSIS — J860 Pyothorax with fistula: Secondary | ICD-10-CM | POA: Diagnosis present

## 2023-03-05 DIAGNOSIS — J918 Pleural effusion in other conditions classified elsewhere: Secondary | ICD-10-CM | POA: Diagnosis present

## 2023-03-05 DIAGNOSIS — Z811 Family history of alcohol abuse and dependence: Secondary | ICD-10-CM

## 2023-03-05 DIAGNOSIS — Z77120 Contact with and (suspected) exposure to mold (toxic): Secondary | ICD-10-CM

## 2023-03-05 DIAGNOSIS — E871 Hypo-osmolality and hyponatremia: Secondary | ICD-10-CM | POA: Diagnosis present

## 2023-03-05 DIAGNOSIS — I10 Essential (primary) hypertension: Secondary | ICD-10-CM | POA: Diagnosis present

## 2023-03-05 DIAGNOSIS — F17210 Nicotine dependence, cigarettes, uncomplicated: Secondary | ICD-10-CM | POA: Diagnosis present

## 2023-03-05 DIAGNOSIS — D509 Iron deficiency anemia, unspecified: Secondary | ICD-10-CM | POA: Diagnosis present

## 2023-03-05 DIAGNOSIS — J181 Lobar pneumonia, unspecified organism: Secondary | ICD-10-CM | POA: Insufficient documentation

## 2023-03-05 DIAGNOSIS — Z79899 Other long term (current) drug therapy: Secondary | ICD-10-CM

## 2023-03-05 DIAGNOSIS — I9589 Other hypotension: Secondary | ICD-10-CM | POA: Diagnosis not present

## 2023-03-05 LAB — URINALYSIS, ROUTINE W REFLEX MICROSCOPIC
Bilirubin Urine: NEGATIVE
Glucose, UA: NEGATIVE mg/dL
Hgb urine dipstick: NEGATIVE
Ketones, ur: NEGATIVE mg/dL
Leukocytes,Ua: NEGATIVE
Nitrite: NEGATIVE
Protein, ur: NEGATIVE mg/dL
Specific Gravity, Urine: 1.003 — ABNORMAL LOW (ref 1.005–1.030)
pH: 7 (ref 5.0–8.0)

## 2023-03-05 LAB — PROTIME-INR
INR: 1.2 (ref 0.8–1.2)
Prothrombin Time: 15.4 s — ABNORMAL HIGH (ref 11.4–15.2)

## 2023-03-05 LAB — CBC
HCT: 28.3 % — ABNORMAL LOW (ref 39.0–52.0)
Hemoglobin: 9.2 g/dL — ABNORMAL LOW (ref 13.0–17.0)
MCH: 28.8 pg (ref 26.0–34.0)
MCHC: 32.5 g/dL (ref 30.0–36.0)
MCV: 88.7 fL (ref 80.0–100.0)
Platelets: 433 10*3/uL — ABNORMAL HIGH (ref 150–400)
RBC: 3.19 MIL/uL — ABNORMAL LOW (ref 4.22–5.81)
RDW: 15.5 % (ref 11.5–15.5)
WBC: 16.4 10*3/uL — ABNORMAL HIGH (ref 4.0–10.5)
nRBC: 0 % (ref 0.0–0.2)

## 2023-03-05 LAB — LACTIC ACID, PLASMA: Lactic Acid, Venous: 1 mmol/L (ref 0.5–1.9)

## 2023-03-05 LAB — COMPREHENSIVE METABOLIC PANEL
ALT: 22 U/L (ref 0–44)
AST: 24 U/L (ref 15–41)
Albumin: 2.3 g/dL — ABNORMAL LOW (ref 3.5–5.0)
Alkaline Phosphatase: 131 U/L — ABNORMAL HIGH (ref 38–126)
Anion gap: 10 (ref 5–15)
BUN: 5 mg/dL — ABNORMAL LOW (ref 6–20)
CO2: 22 mmol/L (ref 22–32)
Calcium: 8.7 mg/dL — ABNORMAL LOW (ref 8.9–10.3)
Chloride: 92 mmol/L — ABNORMAL LOW (ref 98–111)
Creatinine, Ser: 0.38 mg/dL — ABNORMAL LOW (ref 0.61–1.24)
GFR, Estimated: >60 mL/min (ref >60)
Glucose, Bld: 93 mg/dL (ref 70–99)
Potassium: 3.9 mmol/L (ref 3.5–5.1)
Sodium: 124 mmol/L — ABNORMAL LOW (ref 135–145)
Total Bilirubin: 0.4 mg/dL (ref 0.3–1.2)
Total Protein: 6.8 g/dL (ref 6.5–8.1)

## 2023-03-05 LAB — IRON AND TIBC
Iron: 19 ug/dL — ABNORMAL LOW (ref 45–182)
Saturation Ratios: 12 % — ABNORMAL LOW (ref 17.9–39.5)
TIBC: 158 ug/dL — ABNORMAL LOW (ref 250–450)
UIBC: 139 ug/dL

## 2023-03-05 LAB — SODIUM, URINE, RANDOM: Sodium, Ur: <10 mmol/L

## 2023-03-05 LAB — RETICULOCYTES
Immature Retic Fract: 15.5 % (ref 2.3–15.9)
RBC.: 3.32 MIL/uL — ABNORMAL LOW (ref 4.22–5.81)
Retic Count, Absolute: 63.4 10*3/uL (ref 19.0–186.0)
Retic Ct Pct: 1.9 % (ref 0.4–3.1)

## 2023-03-05 LAB — OSMOLALITY, URINE: Osmolality, Ur: 154 mosm/kg — ABNORMAL LOW (ref 300–900)

## 2023-03-05 LAB — BRAIN NATRIURETIC PEPTIDE: B Natriuretic Peptide: 90.8 pg/mL (ref 0.0–100.0)

## 2023-03-05 LAB — FERRITIN: Ferritin: 501 ng/mL — ABNORMAL HIGH (ref 24–336)

## 2023-03-05 LAB — LIPASE, BLOOD: Lipase: 22 U/L (ref 11–51)

## 2023-03-05 LAB — OSMOLALITY: Osmolality: 274 mosm/kg — ABNORMAL LOW (ref 275–295)

## 2023-03-05 MED ORDER — NICOTINE 21 MG/24HR TD PT24
21.0000 mg | MEDICATED_PATCH | Freq: Every day | TRANSDERMAL | Status: DC
  Administered 2023-03-05 – 2023-03-14 (×10): 21 mg via TRANSDERMAL
  Filled 2023-03-05 (×10): qty 1

## 2023-03-05 MED ORDER — ADULT MULTIVITAMIN W/MINERALS CH
1.0000 | ORAL_TABLET | Freq: Every day | ORAL | Status: DC
  Administered 2023-03-05 – 2023-03-14 (×9): 1 via ORAL
  Filled 2023-03-05 (×10): qty 1

## 2023-03-05 MED ORDER — GUAIFENESIN ER 600 MG PO TB12
1200.0000 mg | ORAL_TABLET | Freq: Two times a day (BID) | ORAL | Status: DC
  Administered 2023-03-05 – 2023-03-14 (×17): 1200 mg via ORAL
  Filled 2023-03-05 (×18): qty 2

## 2023-03-05 MED ORDER — ENSURE ENLIVE PO LIQD
237.0000 mL | Freq: Two times a day (BID) | ORAL | Status: DC
  Administered 2023-03-06 (×2): 237 mL via ORAL

## 2023-03-05 MED ORDER — TRAZODONE HCL 100 MG PO TABS
200.0000 mg | ORAL_TABLET | Freq: Every evening | ORAL | Status: DC | PRN
  Administered 2023-03-07: 200 mg via ORAL
  Filled 2023-03-05: qty 2

## 2023-03-05 MED ORDER — OXYCODONE HCL 5 MG PO TABS
5.0000 mg | ORAL_TABLET | ORAL | Status: DC | PRN
  Administered 2023-03-06 – 2023-03-07 (×6): 5 mg via ORAL
  Filled 2023-03-05 (×6): qty 1

## 2023-03-05 MED ORDER — THIAMINE MONONITRATE 100 MG PO TABS
100.0000 mg | ORAL_TABLET | Freq: Every day | ORAL | Status: DC
  Administered 2023-03-05 – 2023-03-14 (×10): 100 mg via ORAL
  Filled 2023-03-05 (×12): qty 1

## 2023-03-05 MED ORDER — THIAMINE HCL 100 MG/ML IJ SOLN
100.0000 mg | Freq: Every day | INTRAMUSCULAR | Status: DC
  Filled 2023-03-05 (×4): qty 2

## 2023-03-05 MED ORDER — LACTATED RINGERS IV BOLUS
1000.0000 mL | Freq: Once | INTRAVENOUS | Status: AC
  Administered 2023-03-05: 1000 mL via INTRAVENOUS

## 2023-03-05 MED ORDER — SODIUM CHLORIDE 0.9 % IV SOLN
500.0000 mg | INTRAVENOUS | Status: DC
  Administered 2023-03-05: 500 mg via INTRAVENOUS
  Filled 2023-03-05: qty 5

## 2023-03-05 MED ORDER — GABAPENTIN 300 MG PO CAPS
300.0000 mg | ORAL_CAPSULE | Freq: Three times a day (TID) | ORAL | Status: DC
  Administered 2023-03-05 – 2023-03-14 (×25): 300 mg via ORAL
  Filled 2023-03-05 (×26): qty 1

## 2023-03-05 MED ORDER — CYCLOBENZAPRINE HCL 5 MG PO TABS
7.5000 mg | ORAL_TABLET | Freq: Three times a day (TID) | ORAL | Status: DC | PRN
  Administered 2023-03-11 – 2023-03-13 (×4): 7.5 mg via ORAL
  Filled 2023-03-05 (×5): qty 1.5

## 2023-03-05 MED ORDER — FOLIC ACID 1 MG PO TABS
1.0000 mg | ORAL_TABLET | Freq: Every day | ORAL | Status: DC
  Administered 2023-03-05 – 2023-03-14 (×10): 1 mg via ORAL
  Filled 2023-03-05 (×10): qty 1

## 2023-03-05 MED ORDER — BENZONATATE 100 MG PO CAPS
200.0000 mg | ORAL_CAPSULE | Freq: Two times a day (BID) | ORAL | Status: DC
  Administered 2023-03-05 – 2023-03-14 (×16): 200 mg via ORAL
  Filled 2023-03-05 (×17): qty 2

## 2023-03-05 MED ORDER — HYDRALAZINE HCL 20 MG/ML IJ SOLN: 5.0000 mg | Freq: Four times a day (QID) | INTRAMUSCULAR | Status: DC | PRN

## 2023-03-05 MED ORDER — RIFAXIMIN 550 MG PO TABS
550.0000 mg | ORAL_TABLET | Freq: Two times a day (BID) | ORAL | Status: DC
  Administered 2023-03-05 – 2023-03-14 (×17): 550 mg via ORAL
  Filled 2023-03-05 (×18): qty 1

## 2023-03-05 MED ORDER — LORAZEPAM 1 MG PO TABS
1.0000 mg | ORAL_TABLET | ORAL | Status: DC | PRN
  Administered 2023-03-07: 1 mg via ORAL

## 2023-03-05 MED ORDER — LORAZEPAM 0.5 MG PO TABS
0.5000 mg | ORAL_TABLET | ORAL | Status: DC | PRN
  Filled 2023-03-05 (×2): qty 1

## 2023-03-05 MED ORDER — OXYCODONE HCL 5 MG PO TABS
5.0000 mg | ORAL_TABLET | ORAL | Status: AC
  Administered 2023-03-05: 5 mg via ORAL
  Filled 2023-03-05: qty 1

## 2023-03-05 MED ORDER — DULOXETINE HCL 30 MG PO CPEP
30.0000 mg | ORAL_CAPSULE | Freq: Every day | ORAL | Status: DC
  Administered 2023-03-05: 30 mg via ORAL
  Filled 2023-03-05: qty 1

## 2023-03-05 MED ORDER — SODIUM CHLORIDE 0.9 % IV SOLN
2.0000 g | INTRAVENOUS | Status: DC
  Administered 2023-03-05: 2 g via INTRAVENOUS
  Filled 2023-03-05: qty 20

## 2023-03-05 MED ORDER — HYDROXYZINE HCL 25 MG PO TABS
25.0000 mg | ORAL_TABLET | Freq: Three times a day (TID) | ORAL | Status: DC | PRN
  Administered 2023-03-07: 25 mg via ORAL
  Filled 2023-03-05: qty 1

## 2023-03-05 MED ORDER — ENOXAPARIN SODIUM 40 MG/0.4ML IJ SOSY
40.0000 mg | PREFILLED_SYRINGE | INTRAMUSCULAR | Status: DC
  Administered 2023-03-06 – 2023-03-14 (×8): 40 mg via SUBCUTANEOUS
  Filled 2023-03-05 (×9): qty 0.4

## 2023-03-05 MED ORDER — IOHEXOL 300 MG/ML  SOLN
75.0000 mL | Freq: Once | INTRAMUSCULAR | Status: AC | PRN
  Administered 2023-03-05: 75 mL via INTRAVENOUS

## 2023-03-05 NOTE — ED Notes (Signed)
This RN notified EDP of pt bp

## 2023-03-05 NOTE — Sepsis Progress Note (Signed)
Sepsis protocol monitored by eLink ?

## 2023-03-05 NOTE — ED Notes (Signed)
Pt transported to CT ?

## 2023-03-05 NOTE — ED Notes (Signed)
Per lab, unable to draw Quantiferon TB Gold Plus after 4pm due to send out. Quantiferon TB to be drawn at 0400 tomorrow.

## 2023-03-05 NOTE — Code Documentation (Signed)
CODE SEPSIS - PHARMACY COMMUNICATION  **Broad Spectrum Antibiotics should be administered within 1 hour of Sepsis diagnosis**  Time Code Sepsis Called/Page Received: 1506  Antibiotics Ordered: ceftriaxone, azithromycin   Time of 1st antibiotic administration: 1538  Additional action taken by pharmacy:   If necessary, Name of Provider/Nurse Contacted:     Sharen Hones ,PharmD Clinical Pharmacist  03/05/2023  3:18 PM

## 2023-03-05 NOTE — ED Triage Notes (Signed)
Pt sent here for low blood pressure. Pt also has a cough that he's had for 6 months.

## 2023-03-05 NOTE — ED Provider Notes (Signed)
[facility] Provider Note    None    (approximate)   History   Low blood pressure  HPI  Keith Parker is a 47 y.o. male   with a history of cirrhosis, seizures, remote polio, substance abuse   Patient reports he went saw his doctor today as he has been having symptoms for quite a while of a cough that brings up a abnormal tasting sputum.  It has been going on for several months.  Finally felt like he needed to go in because he continues to lose weight about 25 pounds further, he is fatigued.  She had no headache no chest pain no abdominal pain no nausea or vomiting.  No abdominal swelling no leg swelling.  Still eating and drinking, had cereal for breakfast this morning.  Patient is taper down his alcohol use quite a bit but drinks about 40 ounces of beer daily.  Does continue to smoke  Reports he continues to lose weight even though he is trying to gain it back   Physical Exam   Triage Vital Signs: ED Triage Vitals [03/05/23 1337]  Encounter Vitals Group     BP (!) 83/69     Systolic BP Percentile      Diastolic BP Percentile      Pulse Rate 96     Resp 18     Temp 99.2 F (37.3 C)     Temp Source Oral     SpO2 98 %     Weight      Height      Head Circumference      Peak Flow      Pain Score      Pain Loc      Pain Education      Exclude from Growth Chart   Hypotension on arrival noted.  Patient is not febrile, slight tachypnea.  Paints a somewhat insidious picture of worsening fatigue cough history of cirrhosis.  No immediate findings to point towards obvious sepsis as we await laboratory and further diagnostic testing.  He is not febrile  Most recent vital signs: Vitals:   03/05/23 1430 03/05/23 1445  BP: (!) 87/65 100/64  Pulse: 88 83  Resp: 16 19  Temp:    SpO2: 100% 100%     General: Awake, no distress.  He is underweight somewhat emaciated in appearance. CV:  Good peripheral perfusion.  Clear lungs bilaterally.  Does  demonstrate an occasional dry cough.  No distress. Resp:  Normal effort.  Normal tones and rate Abd:  No distention.  Soft nontender nondistended.  No ascites. Other:  No lower extremity edema.  No edema noted anywhere.  Does not appear hypovolemic   ED Results / Procedures / Treatments   Labs (all labs ordered are listed, but only abnormal results are displayed) Labs Reviewed  CBC - Abnormal; Notable for the following components:      Result Value   WBC 16.4 (*)    RBC 3.19 (*)    Hemoglobin 9.2 (*)    HCT 28.3 (*)    Platelets 433 (*)    All other components within normal limits  COMPREHENSIVE METABOLIC PANEL - Abnormal; Notable for the following components:   Sodium 124 (*)    Chloride 92 (*)    BUN 5 (*)    Creatinine, Ser 0.38 (*)    Calcium 8.7 (*)    Albumin 2.3 (*)    Alkaline Phosphatase 131 (*)  All other components within normal limits  PROTIME-INR - Abnormal; Notable for the following components:   Prothrombin Time 15.4 (*)    All other components within normal limits  URINALYSIS, ROUTINE W REFLEX MICROSCOPIC - Abnormal; Notable for the following components:   Color, Urine STRAW (*)    APPearance CLEAR (*)    Specific Gravity, Urine 1.003 (*)    All other components within normal limits  CULTURE, BLOOD (ROUTINE X 2)  CULTURE, BLOOD (ROUTINE X 2)  LIPASE, BLOOD  BRAIN NATRIURETIC PEPTIDE  LACTIC ACID, PLASMA     EKG EKG interpreted by me at 1400 Heart rate 90 Normal sinus rhythm, mild prolongation of QT interval.  Mild artifact.  No evidence of acute ischemia   RADIOLOGY  Concern based on chest x-ray for multifocal infiltrative process involving the left lung, and potentially underlying mass lesion pleural effusion or other process involving the left lower lobe region.  Chest x-ray and CT reads are pending by radiologist at time of signout to Dr. Rosalia Hammers who will follow-up.   PROCEDURES:  Critical Care performed: Yes, see critical care procedure  note(s)  CRITICAL CARE Performed by: Sharyn Creamer   Total critical care time: 30 minutes  Critical care time was exclusive of separately billable procedures and treating other patients.  Critical care was necessary to treat or prevent imminent or life-threatening deterioration.  Critical care was time spent personally by me on the following activities: development of treatment plan with patient and/or surrogate as well as nursing, discussions with consultants, evaluation of patient's response to treatment, examination of patient, obtaining history from patient or surrogate, ordering and performing treatments and interventions, ordering and review of laboratory studies, ordering and review of radiographic studies, pulse oximetry and re-evaluation of patient's condition.   Procedures   MEDICATIONS ORDERED IN ED: Medications  cefTRIAXone (ROCEPHIN) 2 g in sodium chloride 0.9 % 100 mL IVPB (has no administration in time range)  azithromycin (ZITHROMAX) 500 mg in sodium chloride 0.9 % 250 mL IVPB (has no administration in time range)  oxyCODONE (Oxy IR/ROXICODONE) immediate release tablet 5 mg (has no administration in time range)  lactated ringers bolus 1,000 mL (1,000 mLs Intravenous New Bag/Given 03/05/23 1450)  iohexol (OMNIPAQUE) 300 MG/ML solution 75 mL (75 mLs Intravenous Contrast Given 03/05/23 1457)     IMPRESSION / MDM / ASSESSMENT AND PLAN / ED COURSE  I reviewed the triage vital signs and the nursing notes.                              Differential diagnosis includes, but is not limited to, mass lesion, tumor, worsening cirrhosis, hypoalbuminemia, pneumonia, electrolyte abnormality anemia etc.  Differential diagnosis quite broad but very concerning the patient has significant weight loss fatigue hypotension with symptoms ongoing is slowly worsening insidiously for several months now  Labs are notable for acute anemia when compared to prior.  He denies any bleeding or signs or  symptoms of be suggestive of acute GI bleeding.  Urinalysis normal white count elevated.  Hyponatremia and hypoalbuminemia  Patient's presentation is most consistent with acute presentation with potential threat to life or bodily function.  ----------------------------------------- 3:23 PM on 03/05/2023 ----------------------------------------- Code sepsis initiated slightly earlier.  Decision made after noting elevated white count review of chest x-ray imaging concerning for infiltrate versus potential other lesion mass etc. noted especially in the left lung fields.  Patient does present with hypotension but his blood pressure  has improved improving to the 90s without intervention, now improving after fluids as well.  Though he does have a blood pressure less than 90 on presentation, he also has associated hyponatremia and a history of liver disease cirrhosis.  I did not initiate 30 mL liters per kilogram large volume bolus as his blood pressure is greater than 90 now, he has hyponatremia and I do not wish to overcorrect at a rapid rate with large volume such as 2 L of normal saline, and we are also awaiting further workup including CT imaging as mass lesion is also strongly considered by my review of x-ray     The patient is on the cardiac monitor to evaluate for evidence of arrhythmia and/or significant heart rate changes.   Ongoing care and disposition assigned to Dr. Rosalia Hammers.  Follow-up on results of CT imaging, anticipate need for admission for concerns of pneumonia or mass lesion also weight loss, fatigue hyponatremia.        FINAL CLINICAL IMPRESSION(S) / ED DIAGNOSES   Final diagnoses:  Community acquired pneumonia of left lung, unspecified part of lung  Sepsis, due to unspecified organism, unspecified whether acute organ dysfunction present [facility])     Rx / DC Orders   ED Discharge Orders     None        Note:  This document was prepared using Dragon voice recognition  software and may include unintentional dictation errors.   Sharyn Creamer, MD 03/05/23 [phone]

## 2023-03-05 NOTE — ED Triage Notes (Signed)
First Nurse Note: Patient sent to ED via POV from [facility]. PCP reports a 25 lb weight loss and a BP in the 80's at clinic.

## 2023-03-05 NOTE — ED Provider Notes (Signed)
Care of this patient assumed from prior physician at 1500 pending imaging reads and disposition. Please see prior physician note for further details.  Briefly this is a 47 year old male who presented with a 57-month history of cough with weight loss.  He was seen in his primary care office and noted to be hypotensive and directed to the ER.  Daily alcohol and tobacco use.  Labs here with leukocytosis at 19.4, anemia with hemoglobin of 9.2.  CMP notable for hyponatremia with sodium 124, previously 135 9 months ago.  Normal lactate.  Urinalysis negative.  Chest x-Jeris Easterly concerning for pneumonia with possible mass.  CT chest ordered by prior provider as well as empiric antibiotics. This demonstrated a cavitary mass in the left upper lobe with associated hydropneumothorax and consolidative opacity in the left lower lobe.  Radiology reports differential including TB versus neoplasm.  Patient denies a history of incarceration or homelessness.  However, with his chronic cough and weight loss will order Quanteferon TB test and place on airborne precautions.  Charge nurse notified and will work on getting patient in negative pressure room.  Will reach out to hospitalist team to discuss admission.  Reviewed with Dr. Chipper Herb.  He will evaluate the patient for anticipated admission.   Trinna Post, MD 03/05/23 [phone]

## 2023-03-05 NOTE — H&P (Signed)
History and Physical    [name]:[id number] DOB: 1975-07-02 DOA: 03/05/2023  PCP: Leanna Sato, MD (Confirm with patient/family/NH records and if not entered, this has to be entered at [facility] point of entry) Patient coming from: Home  I have personally briefly reviewed patient's old medical records in [facility]  Chief Complaint: Productive cough, loss of appetite, malaise and weight loss  HPI: Keith Parker is a 47 y.o. male with medical history significant of alcoholic liver cirrhosis, polio with chronic right leg pain, anxiety/depression, presented with worsening of productive cough, loss of appetite, malaise and weight loss.  Symptoms started 6 months ago, patient started develop productive cough with thick brownish sputum, night> day, associated with loss of appetite and malaise.  He does not see any blood streaks in the sputum.  Denies any fever chills no night sweat.  He lost about 25 pounds from 120 to 95 pounds in 6 months.  Yesterday he went to see PCP who recommended patient come to ED for evaluation.  ED Course: Low-grade fever 99.2, down tachycardia blood pressure 90/60 nonhypoxic.  CT chest 5.9 cm cavity mass in the left upper lobe with irregular and thickened margins associated with loculated left hydropneumothorax, consolidated opacity in the left lower lobe.  Lab work hemoglobin 9.2, WBC 6.4.  Patient was given ceftriaxone and azithromycin in the ED  Review of Systems: As per HPI otherwise 14 point review of systems negative.    Past Medical History:  Diagnosis Date   Allergy    Arthritis    Depression    Elevated liver enzymes 10/29/2017   GERD (gastroesophageal reflux disease)    Hearing loss, conductive, bilateral 12/31/2017   Hypertension    Polio    Seizures (HCC)    last seizure about 7 years ago.    Substance abuse (HCC)    Alcohol abuse    Past Surgical History:  Procedure Laterality Date   COLONOSCOPY N/A 06/15/2022   Procedure: COLONOSCOPY;   Surgeon: Wyline Mood, MD;  Location: [facility] ENDOSCOPY;  Service: Gastroenterology;  Laterality: N/A;   COLONOSCOPY WITH PROPOFOL N/A 07/13/2021   Procedure: COLONOSCOPY WITH PROPOFOL;  Surgeon: Wyline Mood, MD;  Location: [facility] ENDOSCOPY;  Service: Gastroenterology;  Laterality: N/A;   ESOPHAGOGASTRODUODENOSCOPY N/A 07/13/2021   Procedure: ESOPHAGOGASTRODUODENOSCOPY (EGD);  Surgeon: Wyline Mood, MD;  Location: [facility] ENDOSCOPY;  Service: Gastroenterology;  Laterality: N/A;   ESOPHAGOGASTRODUODENOSCOPY (EGD) WITH PROPOFOL N/A 10/13/2021   Procedure: ESOPHAGOGASTRODUODENOSCOPY (EGD) WITH PROPOFOL;  Surgeon: Wyline Mood, MD;  Location: [facility] ENDOSCOPY;  Service: Gastroenterology;  Laterality: N/A;   ESOPHAGOGASTRODUODENOSCOPY (EGD) WITH PROPOFOL N/A 11/08/2021   Procedure: ESOPHAGOGASTRODUODENOSCOPY (EGD) WITH PROPOFOL;  Surgeon: Wyline Mood, MD;  Location: [facility] ENDOSCOPY;  Service: Gastroenterology;  Laterality: N/A;   ESOPHAGOGASTRODUODENOSCOPY (EGD) WITH PROPOFOL N/A 06/15/2022   Procedure: ESOPHAGOGASTRODUODENOSCOPY (EGD) WITH PROPOFOL;  Surgeon: Wyline Mood, MD;  Location: [facility] ENDOSCOPY;  Service: Gastroenterology;  Laterality: N/A;   LEG SURGERY  1988   8 surgeries-hips and legs for walking after getting polio- last one 1988   WRIST SURGERY     right     reports that he has been smoking cigarettes. He has a 50 pack-year smoking history. He has never used smokeless tobacco. He reports current alcohol use of about 70.0 standard drinks of alcohol per week. He reports that he does not use drugs.  No Known Allergies  Family History  Problem Relation Age of Onset   Cancer Mother  Breast   Arthritis Mother    Alcohol abuse Father    Arthritis Father    Hypertension Father    Cancer Maternal Grandmother        Breast   Hypertension Maternal Grandmother    Hypertension Maternal Grandfather    Hypertension Paternal Grandmother    Hypertension Paternal Grandfather    Drug abuse Maternal Uncle      Prior to Admission medications   Medication Sig Start Date End Date Taking? Authorizing Provider  buprenorphine (BUTRANS) 10 MCG/HR PTWK 1 patch once a week. 09/27/21   [provider]  cyclobenzaprine (FEXMID) 7.5 MG tablet Take 7.5 mg by mouth 3 (three) times daily as needed. 03/30/22   [provider]  diclofenac (CATAFLAM) 50 MG tablet Take by mouth daily. 03/30/22   [provider]  DULoxetine (CYMBALTA) 30 MG capsule Take 30 mg by mouth at bedtime. 10/10/21   [provider]  furosemide (LASIX) 40 MG tablet Take 40 mg by mouth daily. 01/10/22   [provider]  gabapentin (NEURONTIN) 300 MG capsule Take 300 mg by mouth 3 (three) times daily. 10/26/21   [provider]  hydrOXYzine (ATARAX) 25 MG tablet Take 1 tablet (25 mg total) by mouth every 8 (eight) hours as needed for anxiety. 01/01/22   Phineas Semen, MD  nadolol (CORGARD) 20 MG tablet Take 20 mg by mouth daily.    [provider]  nadolol (CORGARD) 20 MG tablet TAKE 1 TABLET (20MG ) BY MOUTH IN THE MORNING AND 2 TABLETS (40MG ) IN THE EVENING *3 TABS PER DAY* 12/07/22   Wyline Mood, MD  NUCYNTA 50 MG tablet Take 50 mg by mouth 2 (two) times daily as needed. 02/09/22   [provider]  Potassium Chloride ER 20 MEQ TBCR Take 1 tablet by mouth daily. 10/26/21   [provider]  spironolactone (ALDACTONE) 50 MG tablet Take 1 tablet (50 mg total) by mouth daily. 05/23/22   Wyline Mood, MD  traZODone (DESYREL) 100 MG tablet Take 200 mg by mouth at bedtime as needed. 05/09/22   [provider]  XIFAXAN 550 MG TABS tablet Take 550 mg by mouth 2 (two) times daily. 05/09/22   [provider]    Physical Exam: Vitals:   03/05/23 1700 03/05/23 1730 03/05/23 1737 03/05/23 1800  BP: (!) 84/59 (!) 87/63  90/60  Pulse: 82 88  78  Resp: (!) 25 (!) 23  (!) 25  Temp:   99 F (37.2 C)   TempSrc:      SpO2: 99% 100%  99%    Constitutional: NAD, calm,  comfortable Vitals:   03/05/23 1700 03/05/23 1730 03/05/23 1737 03/05/23 1800  BP: (!) 84/59 (!) 87/63  90/60  Pulse: 82 88  78  Resp: (!) 25 (!) 23  (!) 25  Temp:   99 F (37.2 C)   TempSrc:      SpO2: 99% 100%  99%  General, chronic ill appearance Eyes: PERRL, lids and conjunctivae normal ENMT: Mucous membranes are dryt. Posterior pharynx clear of any exudate or lesions.Normal dentition.  Neck: normal, supple, no masses, no thyromegaly Respiratory: clear to auscultation bilaterally, no wheezing, crackles on the left side. Normal respiratory effort. No accessory muscle use.  Cardiovascular: Regular rate and rhythm, no murmurs / rubs / gallops. No extremity edema. 2+ pedal pulses. No carotid bruits.  Abdomen: no tenderness, no masses palpated. No hepatosplenomegaly. Bowel sounds positive.  Musculoskeletal: no clubbing / cyanosis. No joint deformity upper and  lower extremities. Good ROM, no contractures. Normal muscle tone.  Skin: no rashes, lesions, ulcers. No induration Neurologic: CN 2-12 grossly intact. Sensation intact, DTR normal. Chronic right leg contracted and weakness Psychiatric: Normal judgment and insight. Alert and oriented x 3. Normal mood.     Labs on Admission: I have personally reviewed following labs and imaging studies  CBC: Recent Labs  Lab 03/05/23 1403  WBC 16.4*  HGB 9.2*  HCT 28.3*  MCV 88.7  PLT 433*   Basic Metabolic Panel: Recent Labs  Lab 03/05/23 1403  NA 124*  K 3.9  CL 92*  CO2 22  GLUCOSE 93  BUN 5*  CREATININE 0.38*  CALCIUM 8.7*   GFR: CrCl cannot be calculated (Unknown ideal weight.). Liver Function Tests: Recent Labs  Lab 03/05/23 1403  AST 24  ALT 22  ALKPHOS 131*  BILITOT 0.4  PROT 6.8  ALBUMIN 2.3*   Recent Labs  Lab 03/05/23 1403  LIPASE 22   No results for input(s): "AMMONIA" in the last 168 hours. Coagulation Profile: Recent Labs  Lab 03/05/23 1403  INR 1.2   Cardiac Enzymes: No results for input(s):  "CKTOTAL", "CKMB", "CKMBINDEX", "TROPONINI" in the last 168 hours. BNP (last 3 results) No results for input(s): "PROBNP" in the last 8760 hours. HbA1C: No results for input(s): "HGBA1C" in the last 72 hours. CBG: No results for input(s): "GLUCAP" in the last 168 hours. Lipid Profile: No results for input(s): "CHOL", "HDL", "LDLCALC", "TRIG", "CHOLHDL", "LDLDIRECT" in the last 72 hours. Thyroid Function Tests: No results for input(s): "TSH", "T4TOTAL", "FREET4", "T3FREE", "THYROIDAB" in the last 72 hours. Anemia Panel: No results for input(s): "VITAMINB12", "FOLATE", "FERRITIN", "TIBC", "IRON", "RETICCTPCT" in the last 72 hours. Urine analysis:    Component Value Date/Time   COLORURINE STRAW (A) 03/05/2023 1440   APPEARANCEUR CLEAR (A) 03/05/2023 1440   LABSPEC 1.003 (L) 03/05/2023 1440   LABSPEC 1.025 02/08/2018 1347   PHURINE 7.0 03/05/2023 1440   GLUCOSEU NEGATIVE 03/05/2023 1440   HGBUR NEGATIVE 03/05/2023 1440   BILIRUBINUR NEGATIVE 03/05/2023 1440   BILIRUBINUR negative 02/08/2018 1347   KETONESUR NEGATIVE 03/05/2023 1440   PROTEINUR NEGATIVE 03/05/2023 1440   UROBILINOGEN 1.0 03/31/2008 1300   NITRITE NEGATIVE 03/05/2023 1440   LEUKOCYTESUR NEGATIVE 03/05/2023 1440    Radiological Exams on Admission: CT Chest W Contrast  Result Date: 03/05/2023 CLINICAL DATA:  Abnormal chest x-ray.  Lung mass.  Weight loss. EXAM: CT CHEST WITH CONTRAST TECHNIQUE: Multidetector CT imaging of the chest was performed during intravenous contrast administration. RADIATION DOSE REDUCTION: This exam was performed according to the departmental dose-optimization program which includes automated exposure control, adjustment of the mA and/or kV according to patient size and/or use of iterative reconstruction technique. CONTRAST:  75mL OMNIPAQUE IOHEXOL 300 MG/ML  SOLN COMPARISON:  X-ray earlier 03/05/2023.  CT scan September 2015 FINDINGS: Cardiovascular: Heart is nonenlarged. No pericardial effusion.  Normal caliber thoracic aorta. Mediastinum/Nodes: No specific abnormal lymph node enlargement identified in the axillary regions. No abnormal nodes in the right hilum. There are some small left hilar and mediastinal nodes. Example left infrahilar region measures 15 x 7 mm. Precarinal node measures 8 mm in short axis on series 2, image 64. Normal caliber thoracic esophagus. Preserved thyroid gland. Lungs/Pleura: Diffuse breathing motion. Right lung is grossly clear without consolidation, pneumothorax or effusion. There is a 3 mm right lower lobe lung nodule on series 2, image 91. Not clearly seen previously in 2015. However the left lung has a area of  consolidation left lower lobe with some air bronchograms, associated pleural thickening and a loculated pleural effusion with some air, loculated hydropneumothorax. Overall this measures a proximally 9.3 x 2.6 cm in the axial plane and extends cephalocaudal of up to 19 cm proximally. This extends up to a cavitary lesion in the posterior left upper lobe abutting the pleura with thick irregular walls measuring 5.9 x 4.0 cm. Along the superior aspect of the consolidation left lower lobe is a nodular component extending into the superior segment and slightly crossing the interlobar fissure. This has a nodular component with spiculations on series 2, image 80 measures 2.0 by 1.7 cm. There are some small nodules elsewhere in the left lung such as series 2, image 60 measuring 5 mm. Other foci on image 66. Upper Abdomen: Adrenal glands are preserved in the upper abdomen. Stone in the nondilated gallbladder. Musculoskeletal: Curvature of the thoracic spine with some mild degenerative changes. IMPRESSION: 5.9 cm cavitary mass in the left upper lobe abutting the pleura with irregular and thickened margins. Associated loculated left hydropneumothorax extending down to the diaphragm with consolidative opacity in the left lower lobe. Additional nodular component seen adjacent to the  consolidation in the superior segment left lower lobe which may cross the pleura. Overall differential would include atypical infection such as TB versus neoplasm amongst other differential. Recommend additional workup. Few separate tiny lung nodules elsewhere and some prominent lymph nodes in the mediastinum and left hilum. Attention on short follow-up. Gallstones. Electronically Signed   By: Karen Kays M.D.   On: 03/05/2023 16:54   DG Chest 2 View  Result Date: 03/05/2023 CLINICAL DATA:  25 pound weight loss, cough, dyspnea EXAM: CHEST - 2 VIEW COMPARISON:  01/01/2022 FINDINGS: Normal cardiac and mediastinal contours. New left upper lobe opacity. Moderate left pleural effusion with associated atelectasis. No right lung opacity or pleural effusion. No definite pneumothorax. No acute osseous abnormality. IMPRESSION: 1. New left upper lobe opacity, concerning for pneumonia, although a mass could appear similar. Correlate with same day chest CT. 2. Moderate left pleural effusion with associated atelectasis. Electronically Signed   By: Wiliam Ke M.D.   On: 03/05/2023 15:56    EKG: Independently reviewed.  Prolonged QTc  Assessment/Plan Principal Problem:   PNA (pneumonia) Active Problems:   Alcoholic cirrhosis of liver with ascites (HCC)   Weight loss   Lobar pneumonia (HCC)   Hydropneumothorax  (please populate well all problems here in Problem List. (For example, if patient is on BP meds at home and you resume or decide to hold them, it is a problem that needs to be her. Same for CAD, COPD, HLD and so on)  Left lung cavity pneumonia -With cavity lesions and hydropneumothorax, etiology likely atypical infection, rule out TB.  Discussed with on-call pulmonary Dr. Karna Christmas, given that patient does not have significant decompensation such as sepsis, high fever or hypoxia, plan is to hold off anti-TB treatment.  Pulmonary will see the patient tomorrow morning for formal consultation.  Pulmonary  recommended hold off antibiotics. 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  Anemia, normocytic -Denies any black tarry stool or blood in the stool. -Check iron study and reticulocyte count  Liver cirrhosis -Blood pressure borderline low probably secondary to chronic poor nutrition and weight loss,  hold off nodule lower and Lasix and Aldactone -Continue rifaximin  Alcohol abuse -CIWA protocol with as needed benzos  Anxiety/depression -Continue SSRI  QTc prolongation -Repeat EKG tomorrow    DVT prophylaxis: Lovenox Code Status: Full code Family Communication: None at bedside Disposition Plan: Patient is sick with cavity pneumonia, requiring inpatient pulmonary consultation, expect more than 2 midnight hospital stay   Consults called: Pulmonaty Admission status: Tele admit   Emeline General MD [facility] Pager [phone]  03/05/2023, 6:44 PM

## 2023-03-05 NOTE — ED Notes (Signed)
Attempted to get temperature at this time, but patient had taken drinks of coffee in the last 5 minutes per patient. Will attempt to reassess in 15 minutes

## 2023-03-05 NOTE — ED Notes (Signed)
EDP at bedside  

## 2023-03-06 ENCOUNTER — Inpatient Hospital Stay: Payer: Medicare Other

## 2023-03-06 ENCOUNTER — Other Ambulatory Visit: Payer: Self-pay

## 2023-03-06 ENCOUNTER — Encounter: Payer: Self-pay | Admitting: Internal Medicine

## 2023-03-06 DIAGNOSIS — J189 Pneumonia, unspecified organism: Secondary | ICD-10-CM | POA: Diagnosis not present

## 2023-03-06 LAB — EXPECTORATED SPUTUM ASSESSMENT W GRAM STAIN, RFLX TO RESP C

## 2023-03-06 LAB — CBC
HCT: 28.4 % — ABNORMAL LOW (ref 39.0–52.0)
Hemoglobin: 9.3 g/dL — ABNORMAL LOW (ref 13.0–17.0)
MCH: 28.5 pg (ref 26.0–34.0)
MCHC: 32.7 g/dL (ref 30.0–36.0)
MCV: 87.1 fL (ref 80.0–100.0)
Platelets: 355 10*3/uL (ref 150–400)
RBC: 3.26 MIL/uL — ABNORMAL LOW (ref 4.22–5.81)
RDW: 15.5 % (ref 11.5–15.5)
WBC: 10.9 10*3/uL — ABNORMAL HIGH (ref 4.0–10.5)
nRBC: 0 % (ref 0.0–0.2)

## 2023-03-06 LAB — BASIC METABOLIC PANEL
Anion gap: 7 (ref 5–15)
BUN: 6 mg/dL (ref 6–20)
CO2: 23 mmol/L (ref 22–32)
Calcium: 8.2 mg/dL — ABNORMAL LOW (ref 8.9–10.3)
Chloride: 97 mmol/L — ABNORMAL LOW (ref 98–111)
Creatinine, Ser: 0.36 mg/dL — ABNORMAL LOW (ref 0.61–1.24)
GFR, Estimated: >60 mL/min (ref >60)
Glucose, Bld: 98 mg/dL (ref 70–99)
Potassium: 3.6 mmol/L (ref 3.5–5.1)
Sodium: 127 mmol/L — ABNORMAL LOW (ref 135–145)

## 2023-03-06 LAB — FOLATE: Folate: >40 ng/mL (ref >5.9)

## 2023-03-06 LAB — VITAMIN B12: Vitamin B-12: 371 pg/mL (ref 180–914)

## 2023-03-06 LAB — HIV ANTIBODY (ROUTINE TESTING W REFLEX): HIV Screen 4th Generation wRfx: NONREACTIVE

## 2023-03-06 LAB — MRSA NEXT GEN BY PCR, NASAL: MRSA by PCR Next Gen: NOT DETECTED

## 2023-03-06 LAB — STREP PNEUMONIAE URINARY ANTIGEN: Strep Pneumo Urinary Antigen: NEGATIVE

## 2023-03-06 MED ORDER — SODIUM CHLORIDE 0.9 % IV SOLN
300.0000 mg | Freq: Once | INTRAVENOUS | Status: AC
  Administered 2023-03-06: 300 mg via INTRAVENOUS
  Filled 2023-03-06: qty 300

## 2023-03-06 MED ORDER — SPIRITUS FRUMENTI: 1.0000 | Freq: Three times a day (TID) | ORAL | Status: DC | PRN

## 2023-03-06 MED ORDER — GADOBUTROL 1 MMOL/ML IV SOLN
5.0000 mL | Freq: Once | INTRAVENOUS | Status: AC | PRN
  Administered 2023-03-06: 5 mL via INTRAVENOUS

## 2023-03-06 MED ORDER — SODIUM CHLORIDE 0.9 % IV SOLN: INTRAVENOUS | Status: DC | PRN

## 2023-03-06 MED ORDER — FERROUS SULFATE 325 (65 FE) MG PO TABS
325.0000 mg | ORAL_TABLET | Freq: Two times a day (BID) | ORAL | Status: DC
  Administered 2023-03-07 – 2023-03-14 (×14): 325 mg via ORAL
  Filled 2023-03-06 (×15): qty 1

## 2023-03-06 MED ORDER — VANCOMYCIN HCL 500 MG/100ML IV SOLN
500.0000 mg | Freq: Two times a day (BID) | INTRAVENOUS | Status: DC
  Administered 2023-03-06 – 2023-03-07 (×2): 500 mg via INTRAVENOUS
  Filled 2023-03-06 (×2): qty 100

## 2023-03-06 MED ORDER — PIPERACILLIN-TAZOBACTAM 3.375 G IVPB
3.3750 g | Freq: Three times a day (TID) | INTRAVENOUS | Status: DC
  Administered 2023-03-06 – 2023-03-09 (×9): 3.375 g via INTRAVENOUS
  Filled 2023-03-06 (×9): qty 50

## 2023-03-06 MED ORDER — VANCOMYCIN HCL IN DEXTROSE 1-5 GM/200ML-% IV SOLN
1000.0000 mg | INTRAVENOUS | Status: AC
  Administered 2023-03-06: 1000 mg via INTRAVENOUS
  Filled 2023-03-06: qty 200

## 2023-03-06 MED ORDER — SPIRITUS FRUMENTI
1.0000 | Freq: Three times a day (TID) | ORAL | Status: DC
  Administered 2023-03-06 – 2023-03-14 (×23): 1 via ORAL
  Filled 2023-03-06 (×26): qty 1

## 2023-03-06 MED ORDER — ENSURE ENLIVE PO LIQD
237.0000 mL | Freq: Three times a day (TID) | ORAL | Status: DC
  Administered 2023-03-06 – 2023-03-07 (×2): 237 mL via ORAL

## 2023-03-06 MED ORDER — SODIUM CHLORIDE 0.9 % IV BOLUS
250.0000 mL | Freq: Once | INTRAVENOUS | Status: AC
  Administered 2023-03-06: 250 mL via INTRAVENOUS

## 2023-03-06 NOTE — Plan of Care (Signed)
Patient remains on Cardiac PCU at time of writing. Patient remains in Airborne Isolation pending rule-out for TB. NPO at midnight for night for potential chest tube placement tomorrow.    Problem: Education: Goal: Knowledge of General Education information will improve Description: Including pain rating scale, medication(s)/side effects and non-pharmacologic comfort measures 03/06/2023 2230 by Rosana Fret, RN Outcome: Progressing 03/06/2023 2230 by Rosana Fret, RN Outcome: Progressing   Problem: Health Behavior/Discharge Planning: Goal: Ability to manage health-related needs will improve 03/06/2023 2230 by Rosana Fret, RN Outcome: Progressing 03/06/2023 2230 by Rosana Fret, RN Outcome: Progressing   Problem: Clinical Measurements: Goal: Ability to maintain clinical measurements within normal limits will improve 03/06/2023 2230 by Rosana Fret, RN Outcome: Progressing 03/06/2023 2230 by Rosana Fret, RN Outcome: Progressing Goal: Will remain free from infection 03/06/2023 2230 by Rosana Fret, RN Outcome: Progressing 03/06/2023 2230 by Rosana Fret, RN Outcome: Progressing Goal: Diagnostic test results will improve 03/06/2023 2230 by Rosana Fret, RN Outcome: Progressing 03/06/2023 2230 by Rosana Fret, RN Outcome: Progressing Goal: Respiratory complications will improve 03/06/2023 2230 by Rosana Fret, RN Outcome: Progressing 03/06/2023 2230 by Rosana Fret, RN Outcome: Progressing Goal: Cardiovascular complication will be avoided 03/06/2023 2230 by Rosana Fret, RN Outcome: Progressing 03/06/2023 2230 by Rosana Fret, RN Outcome: Progressing   Problem: Activity: Goal: Risk for activity intolerance will decrease 03/06/2023 2230 by Rosana Fret, RN Outcome: Progressing 03/06/2023 2230 by Rosana Fret, RN Outcome: Progressing   Problem: Nutrition: Goal: Adequate nutrition will be maintained 03/06/2023 2230 by Rosana Fret,  RN Outcome: Progressing 03/06/2023 2230 by Rosana Fret, RN Outcome: Progressing   Problem: Coping: Goal: Level of anxiety will decrease 03/06/2023 2230 by Rosana Fret, RN Outcome: Progressing 03/06/2023 2230 by Rosana Fret, RN Outcome: Progressing   Problem: Elimination: Goal: Will not experience complications related to bowel motility 03/06/2023 2230 by Rosana Fret, RN Outcome: Progressing 03/06/2023 2230 by Rosana Fret, RN Outcome: Progressing Goal: Will not experience complications related to urinary retention 03/06/2023 2230 by Rosana Fret, RN Outcome: Progressing 03/06/2023 2230 by Rosana Fret, RN Outcome: Progressing   Problem: Pain Managment: Goal: General experience of comfort will improve 03/06/2023 2230 by Rosana Fret, RN Outcome: Progressing 03/06/2023 2230 by Rosana Fret, RN Outcome: Progressing   Problem: Safety: Goal: Ability to remain free from injury will improve 03/06/2023 2230 by Rosana Fret, RN Outcome: Progressing 03/06/2023 2230 by Rosana Fret, RN Outcome: Progressing   Problem: Skin Integrity: Goal: Risk for impaired skin integrity will decrease 03/06/2023 2230 by Rosana Fret, RN Outcome: Progressing 03/06/2023 2230 by Rosana Fret, RN Outcome: Progressing

## 2023-03-06 NOTE — Consult Note (Signed)
PULMONOLOGY         Date: 03/06/2023,   MRN# [id number] Keith Parker [birth year]     AdmissionWeight: 46.3 kg                 CurrentWeight: 46.3 kg  Referring provider: Dr Mikey College   CHIEF COMPLAINT:   Hydropneumothorax   HISTORY OF PRESENT ILLNESS   Patient reports exposure to black mold "very heavy" appx 1-2 years ago.  He has lived in this house with mold for appx 3 years.  He smokes actively, he smokes tobacco and THC.  He denies any other substances but admits to drinking alcohol.  He drinks 40Oz. Of beer daily.     PAST MEDICAL HISTORY   Past Medical History:  Diagnosis Date   Allergy    Arthritis    Depression    Elevated liver enzymes 10/29/2017   GERD (gastroesophageal reflux disease)    Hearing loss, conductive, bilateral 12/31/2017   Hypertension    Polio    Seizures (HCC)    last seizure about 7 years ago.    Substance abuse (HCC)    Alcohol abuse     SURGICAL HISTORY   Past Surgical History:  Procedure Laterality Date   COLONOSCOPY N/A 06/15/2022   Procedure: COLONOSCOPY;  Surgeon: Wyline Mood, MD;  Location: [facility] ENDOSCOPY;  Service: Gastroenterology;  Laterality: N/A;   COLONOSCOPY WITH PROPOFOL N/A 07/13/2021   Procedure: COLONOSCOPY WITH PROPOFOL;  Surgeon: Wyline Mood, MD;  Location: [facility] ENDOSCOPY;  Service: Gastroenterology;  Laterality: N/A;   ESOPHAGOGASTRODUODENOSCOPY N/A 07/13/2021   Procedure: ESOPHAGOGASTRODUODENOSCOPY (EGD);  Surgeon: Wyline Mood, MD;  Location: [facility] ENDOSCOPY;  Service: Gastroenterology;  Laterality: N/A;   ESOPHAGOGASTRODUODENOSCOPY (EGD) WITH PROPOFOL N/A 10/13/2021   Procedure: ESOPHAGOGASTRODUODENOSCOPY (EGD) WITH PROPOFOL;  Surgeon: Wyline Mood, MD;  Location: [facility] ENDOSCOPY;  Service: Gastroenterology;  Laterality: N/A;   ESOPHAGOGASTRODUODENOSCOPY (EGD) WITH PROPOFOL N/A 11/08/2021   Procedure: ESOPHAGOGASTRODUODENOSCOPY (EGD) WITH PROPOFOL;  Surgeon: Wyline Mood, MD;  Location: [facility] ENDOSCOPY;  Service:  Gastroenterology;  Laterality: N/A;   ESOPHAGOGASTRODUODENOSCOPY (EGD) WITH PROPOFOL N/A 06/15/2022   Procedure: ESOPHAGOGASTRODUODENOSCOPY (EGD) WITH PROPOFOL;  Surgeon: Wyline Mood, MD;  Location: [facility] ENDOSCOPY;  Service: Gastroenterology;  Laterality: N/A;   LEG SURGERY  1988   8 surgeries-hips and legs for walking after getting polio- last one 1988   WRIST SURGERY     right     FAMILY HISTORY   Family History  Problem Relation Age of Onset   Cancer Mother        Breast   Arthritis Mother    Alcohol abuse Father    Arthritis Father    Hypertension Father    Cancer Maternal Grandmother        Breast   Hypertension Maternal Grandmother    Hypertension Maternal Grandfather    Hypertension Paternal Grandmother    Hypertension Paternal Grandfather    Drug abuse Maternal Uncle      SOCIAL HISTORY   Social History   Tobacco Use   Smoking status: Every Day    Current packs/day: 2.00    Average packs/day: 2.0 packs/day for 25.0 years (50.0 ttl pk-yrs)    Types: Cigarettes   Smokeless tobacco: Never  Vaping Use   Vaping status: Never Used  Substance Use Topics   Alcohol use: Yes    Alcohol/week: 70.0 standard drinks of alcohol    Types: 70 Cans of beer per week    Comment: 2 days ago  Drug use: No     MEDICATIONS    Home Medication:    Current Medication:  Current Facility-Administered Medications:    benzonatate (TESSALON) capsule 200 mg, 200 mg, Oral, BID, Mikey College T, MD, 200 mg at 03/05/23 2133   cyclobenzaprine (FLEXERIL) tablet 7.5 mg, 7.5 mg, Oral, TID PRN, Mikey College T, MD   enoxaparin (LOVENOX) injection 40 mg, 40 mg, Subcutaneous, Q24H, Zhang, Ping T, MD   feeding supplement (ENSURE ENLIVE / ENSURE PLUS) liquid 237 mL, 237 mL, Oral, BID BM, Mikey College T, MD   folic acid (FOLVITE) tablet 1 mg, 1 mg, Oral, Daily, Chipper Herb, Ping T, MD, 1 mg at 03/05/23 2029   gabapentin (NEURONTIN) capsule 300 mg, 300 mg, Oral, TID, Mikey College T, MD, 300 mg at  03/05/23 2133   guaiFENesin (MUCINEX) 12 hr tablet 1,200 mg, 1,200 mg, Oral, BID, Mikey College T, MD, 1,200 mg at 03/05/23 2133   hydrALAZINE (APRESOLINE) injection 5 mg, 5 mg, Intravenous, Q6H PRN, Mikey College T, MD   hydrOXYzine (ATARAX) tablet 25 mg, 25 mg, Oral, Q8H PRN, Mikey College T, MD   LORazepam (ATIVAN) tablet 1-4 mg, 1-4 mg, Oral, Q1H PRN **OR** LORazepam (ATIVAN) tablet 0.5 mg, 0.5 mg, Oral, Q4H PRN, Mikey College T, MD   multivitamin with minerals tablet 1 tablet, 1 tablet, Oral, Daily, Mikey College T, MD, 1 tablet at 03/05/23 2029   nicotine (NICODERM CQ - dosed in mg/24 hours) patch 21 mg, 21 mg, Transdermal, Daily, Mikey College T, MD, 21 mg at 03/05/23 1922   oxyCODONE (Oxy IR/ROXICODONE) immediate release tablet 5 mg, 5 mg, Oral, Q4H PRN, Mikey College T, MD, 5 mg at 03/06/23 0336   rifaximin (XIFAXAN) tablet 550 mg, 550 mg, Oral, BID, Mikey College T, MD, 550 mg at 03/05/23 2132   thiamine (VITAMIN B1) tablet 100 mg, 100 mg, Oral, Daily, 100 mg at 03/05/23 2029 **OR** thiamine (VITAMIN B1) injection 100 mg, 100 mg, Intravenous, Daily, Mikey College T, MD   traZODone (DESYREL) tablet 200 mg, 200 mg, Oral, QHS PRN, Emeline General, MD    ALLERGIES   Patient has no known allergies.     REVIEW OF SYSTEMS    Review of Systems:  Gen:  Denies  fever, sweats, chills weigh loss  HEENT: Denies blurred vision, double vision, ear pain, eye pain, hearing loss, nose bleeds, sore throat Cardiac:  No dizziness, chest pain or heaviness, chest tightness,edema Resp:   reports dyspnea chronically  Gi: Denies swallowing difficulty, stomach pain, nausea or vomiting, diarrhea, constipation, bowel incontinence Gu:  Denies bladder incontinence, burning urine Ext:   Denies Joint pain, stiffness or swelling Skin: Denies  skin rash, easy bruising or bleeding or hives Endoc:  Denies polyuria, polydipsia , polyphagia or weight change Psych:   Denies depression, insomnia or hallucinations   Other:  All  other systems negative   VS: BP 104/74 (BP Location: Right Arm)   Pulse (!) 101   Temp 98.9 F (37.2 C)   Resp 16   Ht 5\' 1"  (1.549 m)   Wt 46.3 kg   SpO2 98%   BMI 19.29 kg/m      PHYSICAL EXAM    GENERAL:NAD, no fevers, chills, no weakness no fatigue HEAD: Normocephalic, atraumatic.  EYES: Pupils equal, round, reactive to light. Extraocular muscles intact. No scleral icterus.  MOUTH: Moist mucosal membrane. Dentition intact. No abscess noted.  EAR, NOSE, THROAT: Clear without exudates. No external lesions.  NECK: Supple. No thyromegaly. No nodules.  No JVD.  PULMONARY: decreased breath sounds with mild rhonchi worse at bases bilaterally.  CARDIOVASCULAR: S1 and S2. Regular rate and rhythm. No murmurs, rubs, or gallops. No edema. Pedal pulses 2+ bilaterally.  GASTROINTESTINAL: Soft, nontender, nondistended. No masses. Positive bowel sounds. No hepatosplenomegaly.  MUSCULOSKELETAL: No swelling, clubbing, or edema. Range of motion full in all extremities.  NEUROLOGIC: Cranial nerves II through XII are intact. No gross focal neurological deficits. Sensation intact. Reflexes intact.  SKIN: No ulceration, lesions, rashes, or cyanosis. Skin warm and dry. Turgor intact.  PSYCHIATRIC: Mood, affect within normal limits. The patient is awake, alert and oriented x 3. Insight, judgment intact.       IMAGING       Impression  CLINICAL DATA:  Abnormal chest x-ray.  Lung mass.  Weight loss.   EXAM: CT CHEST WITH CONTRAST   TECHNIQUE: Multidetector CT imaging of the chest was performed during intravenous contrast administration.   RADIATION DOSE REDUCTION: This exam was performed according to the departmental dose-optimization program which includes automated exposure control, adjustment of the mA and/or kV according to patient size and/or use of iterative reconstruction technique.   CONTRAST:  75mL OMNIPAQUE IOHEXOL 300 MG/ML  SOLN   COMPARISON:  X-ray earlier 03/05/2023.   CT scan September 2015   FINDINGS: Cardiovascular: Heart is nonenlarged. No pericardial effusion. Normal caliber thoracic aorta.   Mediastinum/Nodes: No specific abnormal lymph node enlargement identified in the axillary regions. No abnormal nodes in the right hilum. There are some small left hilar and mediastinal nodes. Example left infrahilar region measures 15 x 7 mm. Precarinal node measures 8 mm in short axis on series 2, image 64. Normal caliber thoracic esophagus. Preserved thyroid gland.   Lungs/Pleura: Diffuse breathing motion. Right lung is grossly clear without consolidation, pneumothorax or effusion. There is a 3 mm right lower lobe lung nodule on series 2, image 91. Not clearly seen previously in 2015.   However the left lung has a area of consolidation left lower lobe with some air bronchograms, associated pleural thickening and a loculated pleural effusion with some air, loculated hydropneumothorax. Overall this measures a proximally 9.3 x 2.6 cm in the axial plane and extends cephalocaudal of up to 19 cm proximally. This extends up to a cavitary lesion in the posterior left upper lobe abutting the pleura with thick irregular walls measuring 5.9 x 4.0 cm. Along the superior aspect of the consolidation left lower lobe is a nodular component extending into the superior segment and slightly crossing the interlobar fissure. This has a nodular component with spiculations on series 2, image 80 measures 2.0 by 1.7 cm.   There are some small nodules elsewhere in the left lung such as series 2, image 60 measuring 5 mm. Other foci on image 66.   Upper Abdomen: Adrenal glands are preserved in the upper abdomen. Stone in the nondilated gallbladder.   Musculoskeletal: Curvature of the thoracic spine with some mild degenerative changes.   IMPRESSION: 5.9 cm cavitary mass in the left upper lobe abutting the pleura with irregular and thickened margins. Associated loculated  left hydropneumothorax extending down to the diaphragm with consolidative opacity in the left lower lobe.   Additional nodular component seen adjacent to the consolidation in the superior segment left lower lobe which may cross the pleura.   Overall differential would include atypical infection such as TB versus neoplasm amongst other differential. Recommend additional workup.   Few separate tiny lung nodules elsewhere and some prominent lymph nodes in the  mediastinum and left hilum. Attention on short follow-up.   Gallstones.     Electronically Signed   By: Karen Kays M.D.   On: 03/05/2023 16:54    ASSESSMENT/PLAN   Left hydropneumothorax - patient does have some risk factors for TB including alcoholism and sharing a home with many people who come and go.   -He has smoked his entire life and also has malignancy risk  -he has never had cancer but does have family history including mother with breast cancer and grandmother with breast cancer and uncle with some kind of cancer. - patient would benefit from IR consultation and possible pleural drain.  He may need thoracic surgery evaluation.  -He has lost appx 40 lbs and is currently less then 100lbs with BMI <20.  This may be due to alcoholism vs cancer -He has hyponatremia and reports disequlibrium. 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  -currently he remain in empiric on Zosyn and Vancomycin IV with phramacy consultation -serum fungitell -legionella ab -strep pneumoniae ur AG -Histoplasma Ur Ag -sputum resp cultures -AFB sputum expectorated specimen -sputum cytology  -reviewed pertinent imaging with patient today - ESR/CRP/procalcitonin/MRSA PCR -please encourage patient to use incentive spirometer few times each hour while hospitalized.              Thank you for allowing me to participate in the care of this patient.   Patient/Family are satisfied  with care plan and all questions have been answered.    Provider disclosure: Patient with at least one acute or chronic illness or injury that poses a threat to life or bodily function and is being managed actively during this encounter.  All of the below services have been performed independently by signing provider:  review of prior documentation from internal and or external health records.  Review of previous and current lab results.  Interview and comprehensive assessment during patient visit today. Review of current and previous chest radiographs/CT scans. Discussion of management and test interpretation with health care team and patient/family.   This document was prepared using Dragon voice recognition software and may include unintentional dictation errors.     Vida Rigger, M.D.  Division of Pulmonary & Critical Care Medicine

## 2023-03-06 NOTE — Consult Note (Signed)
Pharmacy Antibiotic Note  Keith Parker is a 47 y.o. male admitted on 03/05/2023 with pneumonia.  Pharmacy has been consulted for vancomycin and Zosyn dosing.  Plan: Zosyn 3.375g IV q8h (4 hour infusion). Vancomycin 1000 mg IV x 1 dose now (9/10 @ 1029), then 500mg  q 12hrs per nomogram with desired Tr 15-20.  AUC calculator results with same regimen, expected AUC: 460, SCr used: 0.8, Cmin 13.1 3. Follow cultures and clinical progress to adjust therapy as needed. Vancomycin random level around 5th dose.  Height: 5\' 1"  (154.9 cm) Weight: 46.3 kg (102 lb 1.6 oz) IBW/kg (Calculated) : 52.3  Temp (24hrs), Avg:99.7 F (37.6 C), Min:98.7 F (37.1 C), Max:102.5 F (39.2 C)  Recent Labs  Lab 03/05/23 1403 03/06/23 0810 03/06/23 0813  WBC 16.4*  --  10.9*  CREATININE 0.38* 0.36*  --   LATICACIDVEN 1.0  --   --     Estimated Creatinine Clearance: 73.1 mL/min (A) (by C-G formula based on SCr of 0.36 mg/dL (L)).    No Known Allergies  Antimicrobials this admission: 9/9 Azithromycin x 1 9/9 ceftriaxone x1 9/20 Vancomycin 9/20 Zosyn >>  Dose adjustments this admission: na  Microbiology results: 9/9 BCx: NG < 24hrs 9/10 MRSA PCR: ordered 9/10 Acid fast x 2 orders 9/10 Legionella Ab ordered  Thank you for allowing pharmacy to be a part of this patient's care.  Jaquae Rieves Rodriguez-Guzman PharmD, BCPS 03/06/2023 9:11 AM

## 2023-03-06 NOTE — Progress Notes (Signed)
PROGRESS NOTE    Keith Parker  ZOX:[id number] DOB: 09/23/1975 DOA: 03/05/2023 PCP: Leanna Sato, MD    Brief Narrative:  47 y.o. male with medical history significant of alcoholic liver cirrhosis, polio with chronic right leg pain, anxiety/depression, presented with worsening of productive cough, loss of appetite, malaise and weight loss.   Symptoms started 6 months ago, patient started develop productive cough with thick brownish sputum, night> day, associated with loss of appetite and malaise.  He does not see any blood streaks in the sputum.  Denies any fever chills no night sweat.  He lost about 25 pounds from 120 to 95 pounds in 6 months.  Yesterday he went to see PCP who recommended patient come to ED for evaluation.  9/10: Seen in consultation by pulmonary.  Interventional radiology consultation requested.  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  Scheduled for IR pleural tap for microbiology and cytology today.   Assessment & Plan:   Principal Problem:   PNA (pneumonia) Active Problems:   Alcoholic cirrhosis of liver with ascites (HCC)   Weight loss   Lobar pneumonia (HCC)   Hydropneumothorax   Left lung cavity pneumonia Bronchopleural fistula Hydropneumothorax Etiology unclear.  Infection and malignancy both on differential.  TB remains on differential given background of alcoholism and living in halfway house/group home.  Pulmonary following.  Interventional radiology engaged. Plan: IV Zosyn Follow-up quant of urine in ED Sputum for AFB every 8 hours x 3 Airborne isolation Fungitell Legionella antibody Strep pneumo urinary antigen Histoplasma urinary antigen Sputum respiratory cultures and cytology Inflammatory markers Stress incentive spirometry use  Hyponatremia Appears euvolemic.  Possible SIADH secondary to lung lesion Fluid restrict and recheck renal parameters in a.m.  Leukocytosis -Secondary to cavitating lung infection,  started on Zosyn per pulmonary   Anemia, normocytic Iron deficiency anemia Iron indices low Plan: IV Venofer 300 mg x 1 Start p.o. iron 325 mg twice daily 9/11   Liver cirrhosis -Blood pressure borderline low probably secondary to chronic poor nutrition and weight loss, hold off nodule lower and Lasix and Aldactone -Continue rifaximin   Alcohol abuse Patient drinks approximately 40 ounces alcohol per day.  Not interested in quitting at this time. Plan: Will order patient beer in the hospital  Tobacco abuse Nicotine patch  DVT prophylaxis: SQ Lovenox Code Status: Full Family Communication: None Disposition Plan: Status is: Inpatient Remains inpatient appropriate because: Cavitary lung lesion   Level of care: Progressive  Consultants:  Pulmonary Interventional radiology  Procedures:  Pleural sampling 9/10  Antimicrobials: Zosyn   Subjective: Seen and examined.  Resting in bed.  Endorses weakness and fatigue and dry cough.  Objective: Vitals:   03/06/23 0207 03/06/23 0300 03/06/23 0900 03/06/23 1154  BP: 105/68 104/74 94/60 96/68   Pulse: 100 (!) 101 83 81  Resp: 20 16 16 16   Temp: 99.9 F (37.7 C) 98.9 F (37.2 C) 98.7 F (37.1 C) 98.3 F (36.8 C)  TempSrc: Oral     SpO2: 96% 98% 100% 99%  Weight:      Height:        Intake/Output Summary (Last 24 hours) at 03/06/2023 1201 Last data filed at 03/05/2023 1725 Gross per 24 hour  Intake 1000.4 ml  Output --  Net 1000.4 ml   Filed Weights   03/05/23 2255  Weight: 46.3 kg    Examination:  General exam: NAD.  Appears frail and chronically ill Respiratory system: Scattered crackles bilaterally.  Normal work of  breathing.  Room air Cardiovascular system: S1-S2, RRR, no murmurs, no pedal edema Gastrointestinal system: Thin, soft, NT/ND, normal bowel sounds Central nervous system: Alert and oriented. No focal neurological deficits. Extremities: Symmetric 5 x 5 power. Skin: No rashes, lesions or  ulcers Psychiatry: Judgement and insight appear normal. Mood & affect appropriate.     Data Reviewed: I have personally reviewed following labs and imaging studies  CBC: Recent Labs  Lab 03/05/23 1403 03/06/23 0813  WBC 16.4* 10.9*  HGB 9.2* 9.3*  HCT 28.3* 28.4*  MCV 88.7 87.1  PLT 433* 355   Basic Metabolic Panel: Recent Labs  Lab 03/05/23 1403 03/06/23 0810  NA 124* 127*  K 3.9 3.6  CL 92* 97*  CO2 22 23  GLUCOSE 93 98  BUN 5* 6  CREATININE 0.38* 0.36*  CALCIUM 8.7* 8.2*   GFR: Estimated Creatinine Clearance: 73.1 mL/min (A) (by C-G formula based on SCr of 0.36 mg/dL (L)). Liver Function Tests: Recent Labs  Lab 03/05/23 1403  AST 24  ALT 22  ALKPHOS 131*  BILITOT 0.4  PROT 6.8  ALBUMIN 2.3*   Recent Labs  Lab 03/05/23 1403  LIPASE 22   No results for input(s): "AMMONIA" in the last 168 hours. Coagulation Profile: Recent Labs  Lab 03/05/23 1403  INR 1.2   Cardiac Enzymes: No results for input(s): "CKTOTAL", "CKMB", "CKMBINDEX", "TROPONINI" in the last 168 hours. BNP (last 3 results) No results for input(s): "PROBNP" in the last 8760 hours. HbA1C: No results for input(s): "HGBA1C" in the last 72 hours. CBG: No results for input(s): "GLUCAP" in the last 168 hours. Lipid Profile: No results for input(s): "CHOL", "HDL", "LDLCALC", "TRIG", "CHOLHDL", "LDLDIRECT" in the last 72 hours. Thyroid Function Tests: No results for input(s): "TSH", "T4TOTAL", "FREET4", "T3FREE", "THYROIDAB" in the last 72 hours. Anemia Panel: Recent Labs    03/05/23 1928  FERRITIN 501*  TIBC 158*  IRON 19*  RETICCTPCT 1.9   Sepsis Labs: Recent Labs  Lab 03/05/23 1403  LATICACIDVEN 1.0    Recent Results (from the past 240 hour(s))  Blood Culture (routine x 2)     Status: None (Preliminary result)   Collection Time: 03/05/23  2:40 PM   Specimen: BLOOD  Result Value Ref Range Status   Specimen Description BLOOD BLOOD RIGHT ARM  Final   Special Requests    Final    BOTTLES DRAWN AEROBIC AND ANAEROBIC Blood Culture adequate volume   Culture   Final    NO GROWTH < 24 HOURS Performed at [facility], [address], [place], [place] [address]    Report Status PENDING  Incomplete  Blood Culture (routine x 2)     Status: None (Preliminary result)   Collection Time: 03/05/23  2:45 PM   Specimen: BLOOD  Result Value Ref Range Status   Specimen Description BLOOD LEFT ANTECUBITAL  Final   Special Requests   Final    BOTTLES DRAWN AEROBIC AND ANAEROBIC Blood Culture adequate volume   Culture   Final    NO GROWTH < 24 HOURS Performed at [facility], [address] [place], [place] [address]    Report Status PENDING  Incomplete         Radiology Studies: MR BRAIN W WO CONTRAST  Result Date: 03/06/2023 CLINICAL DATA:  New diagnosis of lung mass. EXAM: MRI HEAD WITHOUT AND WITH CONTRAST TECHNIQUE: Multiplanar, multiecho pulse sequences of the brain and surrounding structures were obtained without and with intravenous contrast. CONTRAST:  5mL  GADAVIST GADOBUTROL 1 MMOL/ML IV SOLN COMPARISON:  CT head 04/03/2009 FINDINGS: Brain: There is no acute intracranial hemorrhage, extra-axial fluid collection, or acute infarct. Parenchymal volume is normal. The ventricles are normal in size. Patchy FLAIR signal abnormality in the pons is nonspecific but may reflect sequela of chronic small-vessel ischemic change or other prior insult. Parenchymal signal is otherwise normal. The pituitary and suprasellar region are normal. There is no mass lesion or abnormal enhancement. There is no mass effect or midline shift. Vascular: Normal flow voids. Skull and upper cervical spine: Normal marrow signal. Sinuses/Orbits: There is moderate mucosal thickening throughout the paranasal sinuses. The globes and orbits are unremarkable. Other: There are trace bilateral mastoid effusions. The imaged nasopharynx is unremarkable. IMPRESSION: No evidence of  intracranial metastatic disease. Electronically Signed   By: Lesia Hausen M.D.   On: 03/06/2023 10:58   CT Chest W Contrast  Result Date: 03/05/2023 CLINICAL DATA:  Abnormal chest x-ray.  Lung mass.  Weight loss. EXAM: CT CHEST WITH CONTRAST TECHNIQUE: Multidetector CT imaging of the chest was performed during intravenous contrast administration. RADIATION DOSE REDUCTION: This exam was performed according to the departmental dose-optimization program which includes automated exposure control, adjustment of the mA and/or kV according to patient size and/or use of iterative reconstruction technique. CONTRAST:  75mL OMNIPAQUE IOHEXOL 300 MG/ML  SOLN COMPARISON:  X-ray earlier 03/05/2023.  CT scan September 2015 FINDINGS: Cardiovascular: Heart is nonenlarged. No pericardial effusion. Normal caliber thoracic aorta. Mediastinum/Nodes: No specific abnormal lymph node enlargement identified in the axillary regions. No abnormal nodes in the right hilum. There are some small left hilar and mediastinal nodes. Example left infrahilar region measures 15 x 7 mm. Precarinal node measures 8 mm in short axis on series 2, image 64. Normal caliber thoracic esophagus. Preserved thyroid gland. Lungs/Pleura: Diffuse breathing motion. Right lung is grossly clear without consolidation, pneumothorax or effusion. There is a 3 mm right lower lobe lung nodule on series 2, image 91. Not clearly seen previously in 2015. However the left lung has a area of consolidation left lower lobe with some air bronchograms, associated pleural thickening and a loculated pleural effusion with some air, loculated hydropneumothorax. Overall this measures a proximally 9.3 x 2.6 cm in the axial plane and extends cephalocaudal of up to 19 cm proximally. This extends up to a cavitary lesion in the posterior left upper lobe abutting the pleura with thick irregular walls measuring 5.9 x 4.0 cm. Along the superior aspect of the consolidation left lower lobe is a  nodular component extending into the superior segment and slightly crossing the interlobar fissure. This has a nodular component with spiculations on series 2, image 80 measures 2.0 by 1.7 cm. There are some small nodules elsewhere in the left lung such as series 2, image 60 measuring 5 mm. Other foci on image 66. Upper Abdomen: Adrenal glands are preserved in the upper abdomen. Stone in the nondilated gallbladder. Musculoskeletal: Curvature of the thoracic spine with some mild degenerative changes. IMPRESSION: 5.9 cm cavitary mass in the left upper lobe abutting the pleura with irregular and thickened margins. Associated loculated left hydropneumothorax extending down to the diaphragm with consolidative opacity in the left lower lobe. Additional nodular component seen adjacent to the consolidation in the superior segment left lower lobe which may cross the pleura. Overall differential would include atypical infection such as TB versus neoplasm amongst other differential. Recommend additional workup. Few separate tiny lung nodules elsewhere and some prominent lymph nodes in the mediastinum and  left hilum. Attention on short follow-up. Gallstones. Electronically Signed   By: Karen Kays M.D.   On: 03/05/2023 16:54   DG Chest 2 View  Result Date: 03/05/2023 CLINICAL DATA:  25 pound weight loss, cough, dyspnea EXAM: CHEST - 2 VIEW COMPARISON:  01/01/2022 FINDINGS: Normal cardiac and mediastinal contours. New left upper lobe opacity. Moderate left pleural effusion with associated atelectasis. No right lung opacity or pleural effusion. No definite pneumothorax. No acute osseous abnormality. IMPRESSION: 1. New left upper lobe opacity, concerning for pneumonia, although a mass could appear similar. Correlate with same day chest CT. 2. Moderate left pleural effusion with associated atelectasis. Electronically Signed   By: Wiliam Ke M.D.   On: 03/05/2023 15:56        Scheduled Meds:  benzonatate  200 mg Oral  BID   enoxaparin (LOVENOX) injection  40 mg Subcutaneous Q24H   feeding supplement  237 mL Oral BID BM   folic acid  1 mg Oral Daily   gabapentin  300 mg Oral TID   guaiFENesin  1,200 mg Oral BID   multivitamin with minerals  1 tablet Oral Daily   nicotine  21 mg Transdermal Daily   rifaximin  550 mg Oral BID   spiritus frumenti  1 each Oral TID   thiamine  100 mg Oral Daily   Or   thiamine  100 mg Intravenous Daily   Continuous Infusions:  iron sucrose 300 mg (03/06/23 1134)   piperacillin-tazobactam (ZOSYN)  IV     vancomycin       LOS: 1 day     Tresa Moore, MD [facility]   If 7PM-7AM, please contact night-coverage  03/06/2023, 12:01 PM

## 2023-03-06 NOTE — Discharge Instructions (Signed)

## 2023-03-06 NOTE — ED Notes (Signed)
Message via secure chat for temperature and blood pressures.

## 2023-03-06 NOTE — Progress Notes (Signed)
Initial Nutrition Assessment  DOCUMENTATION CODES:   Severe malnutrition in context of chronic illness  INTERVENTION:   -Ensure Enlive po TID, each supplement provides 350 kcal and 20 grams of protein -MVI with minerals daily  NUTRITION DIAGNOSIS:   Severe Malnutrition related to chronic illness (cirrhosis) as evidenced by severe fat depletion, severe muscle depletion.  GOAL:   Patient will meet greater than or equal to 90% of their needs  MONITOR:   PO intake, Supplement acceptance, Diet advancement  REASON FOR ASSESSMENT:   Malnutrition Screening Tool    ASSESSMENT:   Pt with medical history significant of alcoholic liver cirrhosis, polio with chronic right leg pain, anxiety/depression, presented with worsening of productive cough, loss of appetite, malaise and weight loss.  Pt admitted with lt lung cavitary pneumonia, bronchopleural fistula, and hydropneumothorax.   Reviewed I/O's: +1 L x 24 hours  Case discussed with RN; procedure on hold for today as pt has already ate. Plan for lt chest tube placement tomorrow secondary to lt upper lobe cavitary mass with hydropneumothorax.   Spoke with pt at bedside, who reports appetite has improved since admission. Pt consumed 100% of lunch tray. Pt shares that he has experienced a general decline in health over the past 6 months. He reports his depression started worsening, but then started experiencing symptoms such as weakness, poor appetite, and cough. Pt initially though he was experiencing COVID symptoms and went to his PCP, who sent him to hospital for further work-up. Pt reports he works out daily at [employer] and was doing so up until a month ago as he felt too poorly to do so.   Reviewed wt hx; pt has experienced a 13.6% wt loss over the past 9 months. While this is not significant for time frame, it is concerning given weight loss, poor oral intake, and multiple medical issues. Pt shares his UBW is around 120#, but has lost  about 25# within the past 6 months.   Pt with history of alcohol abuse and suspect diet of poor nutritional quality pTA>   Discussed importance of good meal and supplement intake to promote healing. Pt amenable to supplements.   Medications reviewed and include ferrous sulfate, folic acid, and thiamine. Pt also being ordered alcohol with meals.   Labs reviewed: Na: 127, CBGS: 88.    NUTRITION - FOCUSED PHYSICAL EXAM:  Flowsheet Row Most Recent Value  Orbital Region Severe depletion  Upper Arm Region Severe depletion  Thoracic and Lumbar Region Severe depletion  Buccal Region Severe depletion  Temple Region Severe depletion  Clavicle Bone Region Severe depletion  Clavicle and Acromion Bone Region Severe depletion  Scapular Bone Region Severe depletion  Dorsal Hand Severe depletion  Patellar Region Severe depletion  Anterior Thigh Region Severe depletion  Posterior Calf Region Severe depletion  Edema (RD Assessment) None  Hair Reviewed  Eyes Reviewed  Mouth Reviewed  Skin Reviewed  Nails Reviewed       Diet Order:   Diet Order             Diet NPO time specified Except for: Sips with Meds  Diet effective midnight                   EDUCATION NEEDS:   Education needs have been addressed  Skin:  Skin Assessment: Reviewed RN Assessment  Last BM:  03/05/23  Height:   Ht Readings from Last 1 Encounters:  03/05/23 5\' 1"  (1.549 m)    Weight:  Wt Readings from Last 1 Encounters:  03/05/23 46.3 kg    Ideal Body Weight:  50.9 kg  BMI:  Body mass index is 19.29 kg/m.  Estimated Nutritional Needs:   Kcal:  1850-2050  Protein:  90-105 grams  Fluid:  > 1.8 L    Levada Schilling, RD, LDN, CDCES Registered Dietitian II Certified Diabetes Care and Education Specialist Please refer to [facility] for RD and/or RD on-call/weekend/after hours pager

## 2023-03-06 NOTE — Progress Notes (Addendum)
Transition of Care [facility]) - Inpatient Brief Assessment   Patient Details  Name: Keith Parker MRN: [id number] Date of Birth: [birth year]  [facility]) CM/SW Contact:    Marquita Palms, LCSW Phone Number: 03/06/2023, 10:02 AM   Clinical Narrative:  Pt on airborne precautions, unable to reach pt via mobile and room phone. Upon chart review pt has PCP Dr Marvis Moeller and [facility] insurance.  TOC consult for SA resources added to AVS. Please consult TOC should additional needs arise.  Transition of Care Asessment: Insurance and Status: Insurance coverage has been reviewed Patient has primary care physician: Yes Home environment has been reviewed: home Prior level of function:: independent Prior/Current Home Services: No current home services Social Determinants of Health Reivew: SDOH reviewed no interventions necessary Readmission risk has been reviewed: Yes Transition of care needs: no transition of care needs at this time

## 2023-03-06 NOTE — Consult Note (Signed)
Chief Complaint: Patient was seen in consultation today for  Chief Complaint  Patient presents with   Hypotension   Referring Physician(s): Dr. Karna Christmas  Supervising Physician: Pernell Dupre  Patient Status: [facility] - Out-pt  History of Present Illness: Keith Parker is a 47 y.o. male with a medical history significant for alcoholic cirrhosis, polio with chronic right leg pain, anxiety and depression who presented to the ED 03/05/23 with productive cough, loss of appetite, malaise and weight loss. Patient stated he developed a productive cough with thick brown sputum approximately 6 months ago. Imaging showed a cavitary mass in the left upper lobe with hydropneumothorax.   CT chest 03/05/23 IMPRESSION: 1. 5.9 cm cavitary mass in the left upper lobe abutting the pleura with irregular and thickened margins. Associated loculated left hydropneumothorax extending down to the diaphragm with consolidative opacity in the left lower lobe. 2. Additional nodular component seen adjacent to the consolidation in the superior segment left lower lobe which may cross the pleura. 3. Overall differential would include atypical infection such as TB versus neoplasm amongst other differential. Recommend additional workup. 4. Few separate tiny lung nodules elsewhere and some prominent lymph nodes in the mediastinum and left hilum. Attention on short follow-up. 5. Gallstones.  Interventional Radiology has been consulted with request for left chest tube placement. Imaging reviewed and procedure approved by Dr. Juliette Alcide.   Past Medical History:  Diagnosis Date   Allergy    Arthritis    Depression    Elevated liver enzymes 10/29/2017   GERD (gastroesophageal reflux disease)    Hearing loss, conductive, bilateral 12/31/2017   Hypertension    Polio    Seizures (HCC)    last seizure about 7 years ago.    Substance abuse (HCC)    Alcohol abuse    Past Surgical History:  Procedure Laterality Date    COLONOSCOPY N/A 06/15/2022   Procedure: COLONOSCOPY;  Surgeon: Wyline Mood, MD;  Location: [facility] ENDOSCOPY;  Service: Gastroenterology;  Laterality: N/A;   COLONOSCOPY WITH PROPOFOL N/A 07/13/2021   Procedure: COLONOSCOPY WITH PROPOFOL;  Surgeon: Wyline Mood, MD;  Location: [facility] ENDOSCOPY;  Service: Gastroenterology;  Laterality: N/A;   ESOPHAGOGASTRODUODENOSCOPY N/A 07/13/2021   Procedure: ESOPHAGOGASTRODUODENOSCOPY (EGD);  Surgeon: Wyline Mood, MD;  Location: [facility] ENDOSCOPY;  Service: Gastroenterology;  Laterality: N/A;   ESOPHAGOGASTRODUODENOSCOPY (EGD) WITH PROPOFOL N/A 10/13/2021   Procedure: ESOPHAGOGASTRODUODENOSCOPY (EGD) WITH PROPOFOL;  Surgeon: Wyline Mood, MD;  Location: [facility] ENDOSCOPY;  Service: Gastroenterology;  Laterality: N/A;   ESOPHAGOGASTRODUODENOSCOPY (EGD) WITH PROPOFOL N/A 11/08/2021   Procedure: ESOPHAGOGASTRODUODENOSCOPY (EGD) WITH PROPOFOL;  Surgeon: Wyline Mood, MD;  Location: [facility] ENDOSCOPY;  Service: Gastroenterology;  Laterality: N/A;   ESOPHAGOGASTRODUODENOSCOPY (EGD) WITH PROPOFOL N/A 06/15/2022   Procedure: ESOPHAGOGASTRODUODENOSCOPY (EGD) WITH PROPOFOL;  Surgeon: Wyline Mood, MD;  Location: [facility] ENDOSCOPY;  Service: Gastroenterology;  Laterality: N/A;   LEG SURGERY  1988   8 surgeries-hips and legs for walking after getting polio- last one 1988   WRIST SURGERY     right    Allergies: Patient has no known allergies.  Medications: Prior to Admission medications   Medication Sig Start Date End Date Taking? Authorizing Provider  furosemide (LASIX) 20 MG tablet Take 20 mg by mouth daily. 02/20/23  Yes [provider]  gabapentin (NEURONTIN) 300 MG capsule Take 300 mg by mouth 3 (three) times daily. 10/26/21  Yes [provider]  hydrOXYzine (ATARAX) 25 MG tablet Take 1 tablet (25 mg total) by mouth every 8 (eight) hours as needed  for anxiety. 01/01/22  Yes Phineas Semen, MD  mirtazapine (REMERON) 15 MG tablet Take 15 mg by mouth at bedtime. 02/05/23   Yes [provider]  nadolol (CORGARD) 20 MG tablet TAKE 1 TABLET (20MG ) BY MOUTH IN THE MORNING AND 2 TABLETS (40MG ) IN THE EVENING *3 TABS PER DAY* 12/07/22  Yes Wyline Mood, MD  PAXLOVID, 300/100, 20 x 150 MG & 10 x 100MG  TBPK Take 3 tablets by mouth 2 (two) times daily. 02/21/23  Yes [provider]  spironolactone (ALDACTONE) 100 MG tablet Take 100 mg by mouth daily. 02/05/23  Yes [provider]  traZODone (DESYREL) 100 MG tablet Take 200 mg by mouth at bedtime as needed. 05/09/22  Yes [provider]  XIFAXAN 550 MG TABS tablet Take 550 mg by mouth 2 (two) times daily. 05/09/22  Yes [provider]     Family History  Problem Relation Age of Onset   Cancer Mother        Breast   Arthritis Mother    Alcohol abuse Father    Arthritis Father    Hypertension Father    Cancer Maternal Grandmother        Breast   Hypertension Maternal Grandmother    Hypertension Maternal Grandfather    Hypertension Paternal Grandmother    Hypertension Paternal Grandfather    Drug abuse Maternal Uncle     Social History   Socioeconomic History   Marital status: Single    Spouse name: Not on file   Number of children: 2   Years of education: 12   Highest education level: Not on file  Occupational History   Occupation: Disabled    Comment: Post polio syndrome / Depression  Tobacco Use   Smoking status: Every Day    Current packs/day: 2.00    Average packs/day: 2.0 packs/day for 25.0 years (50.0 ttl pk-yrs)    Types: Cigarettes   Smokeless tobacco: Never  Vaping Use   Vaping status: Never Used  Substance and Sexual Activity   Alcohol use: Yes    Alcohol/week: 70.0 standard drinks of alcohol    Types: 70 Cans of beer per week    Comment: 2 days ago   Drug use: No   Sexual activity: Not on file  Other Topics Concern   Not on file  Social History Narrative   Fun: Workout   Denies religious beliefs effecting health care.    Social  Determinants of Health   Financial Resource Strain: Not on file  Food Insecurity: No Food Insecurity (03/06/2023)   Hunger Vital Sign    Worried About Running Out of Food in the Last Year: Never true    Ran Out of Food in the Last Year: Never true  Transportation Needs: No Transportation Needs (03/06/2023)   PRAPARE - Administrator, Civil Service (Medical): No    Lack of Transportation (Non-Medical): No  Physical Activity: Not on file  Stress: Not on file  Social Connections: Not on file    Review of Systems: A 12 point ROS discussed and pertinent positives are indicated in the HPI above.  All other systems are negative.  Review of Systems  Constitutional:  Positive for fatigue.  Respiratory:  Positive for cough and shortness of breath.   Cardiovascular:  Negative for chest pain.  Gastrointestinal:  Negative for abdominal pain, diarrhea, nausea and vomiting.  Neurological:  Negative for dizziness and headaches.    Vital Signs: BP 96/68 (BP Location: Left Arm)  Pulse 81   Temp 98.3 F (36.8 C)   Resp 16   Ht 5\' 1"  (1.549 m)   Wt 102 lb 1.6 oz (46.3 kg)   SpO2 99%   BMI 19.29 kg/m   Physical Exam Constitutional:      General: He is not in acute distress.    Appearance: He is underweight. He is not ill-appearing.  Cardiovascular:     Rate and Rhythm: Normal rate.  Pulmonary:     Effort: Pulmonary effort is normal.  Abdominal:     Tenderness: There is no abdominal tenderness.  Skin:    General: Skin is warm and dry.  Neurological:     Mental Status: He is alert and oriented to person, place, and time.  Psychiatric:        Mood and Affect: Mood normal.        Behavior: Behavior normal.        Thought Content: Thought content normal.        Judgment: Judgment normal.     Imaging: MR BRAIN W WO CONTRAST  Result Date: 03/06/2023 CLINICAL DATA:  New diagnosis of lung mass. EXAM: MRI HEAD WITHOUT AND WITH CONTRAST TECHNIQUE: Multiplanar, multiecho pulse  sequences of the brain and surrounding structures were obtained without and with intravenous contrast. CONTRAST:  5mL GADAVIST GADOBUTROL 1 MMOL/ML IV SOLN COMPARISON:  CT head 04/03/2009 FINDINGS: Brain: There is no acute intracranial hemorrhage, extra-axial fluid collection, or acute infarct. Parenchymal volume is normal. The ventricles are normal in size. Patchy FLAIR signal abnormality in the pons is nonspecific but may reflect sequela of chronic small-vessel ischemic change or other prior insult. Parenchymal signal is otherwise normal. The pituitary and suprasellar region are normal. There is no mass lesion or abnormal enhancement. There is no mass effect or midline shift. Vascular: Normal flow voids. Skull and upper cervical spine: Normal marrow signal. Sinuses/Orbits: There is moderate mucosal thickening throughout the paranasal sinuses. The globes and orbits are unremarkable. Other: There are trace bilateral mastoid effusions. The imaged nasopharynx is unremarkable. IMPRESSION: No evidence of intracranial metastatic disease. Electronically Signed   By: Lesia Hausen M.D.   On: 03/06/2023 10:58   CT Chest W Contrast  Result Date: 03/05/2023 CLINICAL DATA:  Abnormal chest x-ray.  Lung mass.  Weight loss. EXAM: CT CHEST WITH CONTRAST TECHNIQUE: Multidetector CT imaging of the chest was performed during intravenous contrast administration. RADIATION DOSE REDUCTION: This exam was performed according to the departmental dose-optimization program which includes automated exposure control, adjustment of the mA and/or kV according to patient size and/or use of iterative reconstruction technique. CONTRAST:  75mL OMNIPAQUE IOHEXOL 300 MG/ML  SOLN COMPARISON:  X-ray earlier 03/05/2023.  CT scan September 2015 FINDINGS: Cardiovascular: Heart is nonenlarged. No pericardial effusion. Normal caliber thoracic aorta. Mediastinum/Nodes: No specific abnormal lymph node enlargement identified in the axillary regions. No  abnormal nodes in the right hilum. There are some small left hilar and mediastinal nodes. Example left infrahilar region measures 15 x 7 mm. Precarinal node measures 8 mm in short axis on series 2, image 64. Normal caliber thoracic esophagus. Preserved thyroid gland. Lungs/Pleura: Diffuse breathing motion. Right lung is grossly clear without consolidation, pneumothorax or effusion. There is a 3 mm right lower lobe lung nodule on series 2, image 91. Not clearly seen previously in 2015. However the left lung has a area of consolidation left lower lobe with some air bronchograms, associated pleural thickening and a loculated pleural effusion with some air, loculated hydropneumothorax. Overall  this measures a proximally 9.3 x 2.6 cm in the axial plane and extends cephalocaudal of up to 19 cm proximally. This extends up to a cavitary lesion in the posterior left upper lobe abutting the pleura with thick irregular walls measuring 5.9 x 4.0 cm. Along the superior aspect of the consolidation left lower lobe is a nodular component extending into the superior segment and slightly crossing the interlobar fissure. This has a nodular component with spiculations on series 2, image 80 measures 2.0 by 1.7 cm. There are some small nodules elsewhere in the left lung such as series 2, image 60 measuring 5 mm. Other foci on image 66. Upper Abdomen: Adrenal glands are preserved in the upper abdomen. Stone in the nondilated gallbladder. Musculoskeletal: Curvature of the thoracic spine with some mild degenerative changes. IMPRESSION: 5.9 cm cavitary mass in the left upper lobe abutting the pleura with irregular and thickened margins. Associated loculated left hydropneumothorax extending down to the diaphragm with consolidative opacity in the left lower lobe. Additional nodular component seen adjacent to the consolidation in the superior segment left lower lobe which may cross the pleura. Overall differential would include atypical  infection such as TB versus neoplasm amongst other differential. Recommend additional workup. Few separate tiny lung nodules elsewhere and some prominent lymph nodes in the mediastinum and left hilum. Attention on short follow-up. Gallstones. Electronically Signed   By: Karen Kays M.D.   On: 03/05/2023 16:54   DG Chest 2 View  Result Date: 03/05/2023 CLINICAL DATA:  25 pound weight loss, cough, dyspnea EXAM: CHEST - 2 VIEW COMPARISON:  01/01/2022 FINDINGS: Normal cardiac and mediastinal contours. New left upper lobe opacity. Moderate left pleural effusion with associated atelectasis. No right lung opacity or pleural effusion. No definite pneumothorax. No acute osseous abnormality. IMPRESSION: 1. New left upper lobe opacity, concerning for pneumonia, although a mass could appear similar. Correlate with same day chest CT. 2. Moderate left pleural effusion with associated atelectasis. Electronically Signed   By: Wiliam Ke M.D.   On: 03/05/2023 15:56    Labs:  CBC: Recent Labs    05/15/22 1407 03/05/23 1403 03/06/23 0813  WBC 11.6* 16.4* 10.9*  HGB 16.4 9.2* 9.3*  HCT 47.0 28.3* 28.4*  PLT 192 433* 355    COAGS: Recent Labs    05/15/22 1407 05/23/22 1356 03/05/23 1403  INR 1.0 1.0 1.2    BMP: Recent Labs    05/15/22 1407 05/23/22 1356 03/05/23 1403 03/06/23 0810  NA 135 135 124* 127*  K 5.5* 4.1 3.9 3.6  CL 96 99 92* 97*  CO2 26 21 22 23   GLUCOSE 104* 122* 93 98  BUN 12 8 5* 6  CALCIUM 10.4* 9.2 8.7* 8.2*  CREATININE 0.77 0.69* 0.38* 0.36*  GFRNONAA  --   --  >60 >60    LIVER FUNCTION TESTS: Recent Labs    05/15/22 1407 05/23/22 1356 03/05/23 1403  BILITOT 0.4 <0.2 0.4  AST 26 26 24   ALT 20 20 22   ALKPHOS 120 103 131*  PROT 7.3 6.9 6.8  ALBUMIN 4.4 4.0* 2.3*    TUMOR MARKERS: No results for input(s): "AFPTM", "CEA", "CA199", "CHROMGRNA" in the last 8760 hours.  Assessment and Plan:  Left upper lobe cavitary mass with hydropneumothorax: Keith Parker,  47 year old male, is tentatively scheduled 03/07/23 for an image-guided left chest tube placement.   Risks and benefits discussed with the patient including bleeding, infection, damage to adjacent structures, bowel perforation/fistula connection, and sepsis.  All of  the patient's questions were answered, patient is agreeable to proceed. He will be NPO at midnight. CBC and PT/INR ordered for the morning.   Consent signed and in chart.  Thank you for this interesting consult.  I greatly enjoyed meeting Keith Parker and look forward to participating in their care.  A copy of this report was sent to the requesting provider on this date.  Electronically Signed: Alwyn Ren, AGACNP-BC [phone] 03/06/2023, 1:46 PM   I spent a total of 20 Minutes    in face to face in clinical consultation, greater than 50% of which was counseling/coordinating care for left chest tube placement.

## 2023-03-07 ENCOUNTER — Other Ambulatory Visit: Payer: Medicare Other

## 2023-03-07 DIAGNOSIS — E43 Unspecified severe protein-calorie malnutrition: Secondary | ICD-10-CM | POA: Insufficient documentation

## 2023-03-07 DIAGNOSIS — J189 Pneumonia, unspecified organism: Secondary | ICD-10-CM | POA: Diagnosis not present

## 2023-03-07 LAB — CBC WITH DIFFERENTIAL/PLATELET
Abs Immature Granulocytes: 0.04 10*3/uL (ref 0.00–0.07)
Basophils Absolute: 0.1 10*3/uL (ref 0.0–0.1)
Basophils Relative: 1 %
Eosinophils Absolute: 0.1 10*3/uL (ref 0.0–0.5)
Eosinophils Relative: 1 %
HCT: 28.2 % — ABNORMAL LOW (ref 39.0–52.0)
Hemoglobin: 9 g/dL — ABNORMAL LOW (ref 13.0–17.0)
Immature Granulocytes: 0 %
Lymphocytes Relative: 17 %
Lymphs Abs: 1.6 10*3/uL (ref 0.7–4.0)
MCH: 28.6 pg (ref 26.0–34.0)
MCHC: 31.9 g/dL (ref 30.0–36.0)
MCV: 89.5 fL (ref 80.0–100.0)
Monocytes Absolute: 0.8 10*3/uL (ref 0.1–1.0)
Monocytes Relative: 8 %
Neutro Abs: 7.1 10*3/uL (ref 1.7–7.7)
Neutrophils Relative %: 73 %
Platelets: 326 10*3/uL (ref 150–400)
RBC: 3.15 MIL/uL — ABNORMAL LOW (ref 4.22–5.81)
RDW: 15.3 % (ref 11.5–15.5)
WBC: 9.7 10*3/uL (ref 4.0–10.5)
nRBC: 0 % (ref 0.0–0.2)

## 2023-03-07 LAB — BASIC METABOLIC PANEL
Anion gap: 5 (ref 5–15)
BUN: <5 mg/dL — ABNORMAL LOW (ref 6–20)
CO2: 25 mmol/L (ref 22–32)
Calcium: 8.1 mg/dL — ABNORMAL LOW (ref 8.9–10.3)
Chloride: 101 mmol/L (ref 98–111)
Creatinine, Ser: 0.42 mg/dL — ABNORMAL LOW (ref 0.61–1.24)
GFR, Estimated: >60 mL/min (ref >60)
Glucose, Bld: 87 mg/dL (ref 70–99)
Potassium: 3.9 mmol/L (ref 3.5–5.1)
Sodium: 131 mmol/L — ABNORMAL LOW (ref 135–145)

## 2023-03-07 LAB — PROCALCITONIN: Procalcitonin: 0.11 ng/mL

## 2023-03-07 LAB — PROTIME-INR
INR: 1.3 — ABNORMAL HIGH (ref 0.8–1.2)
Prothrombin Time: 16.6 s — ABNORMAL HIGH (ref 11.4–15.2)

## 2023-03-07 LAB — C-REACTIVE PROTEIN: CRP: 12.7 mg/dL — ABNORMAL HIGH (ref <1.0)

## 2023-03-07 LAB — LEGIONELLA PNEUMOPHILA TOTAL AB: Legionella Pneumo Total Ab: NONREACTIVE

## 2023-03-07 MED ORDER — SODIUM CHLORIDE 0.9 % IV BOLUS
1000.0000 mL | Freq: Once | INTRAVENOUS | Status: AC
  Administered 2023-03-07: 1000 mL via INTRAVENOUS

## 2023-03-07 NOTE — Progress Notes (Signed)
PULMONOLOGY         Date: 03/07/2023,   MRN# [id number] Keith Parker 02-Jan-1976     AdmissionWeight: 46.3 kg                 CurrentWeight: 46.3 kg  Referring provider: Dr Mikey College   CHIEF COMPLAINT:   Hydropneumothorax   HISTORY OF PRESENT ILLNESS   Patient reports exposure to black mold "very heavy" appx 1-2 years ago.  He has lived in this house with mold for appx 3 years.  He smokes actively, he smokes tobacco and THC.  He denies any other substances but admits to drinking alcohol.  He drinks 40Oz. Of beer daily.    03/07/23- patient is able to eat and is breathing on room air. Reports cough is worse. He has IR consult for chest tube but shares procedure was postponed.  He is on antibiotics with zosyn and vanco.  MRSA is negative. Sputum culture is negative and procalcitonin is flat both indicative of possible cancer as main etiology.   PAST MEDICAL HISTORY   Past Medical History:  Diagnosis Date   Allergy    Arthritis    Depression    Elevated liver enzymes 10/29/2017   GERD (gastroesophageal reflux disease)    Hearing loss, conductive, bilateral 12/31/2017   Hypertension    Polio    Seizures (HCC)    last seizure about 7 years ago.    Substance abuse (HCC)    Alcohol abuse     SURGICAL HISTORY   Past Surgical History:  Procedure Laterality Date   COLONOSCOPY N/A 06/15/2022   Procedure: COLONOSCOPY;  Surgeon: Wyline Mood, MD;  Location: [facility] ENDOSCOPY;  Service: Gastroenterology;  Laterality: N/A;   COLONOSCOPY WITH PROPOFOL N/A 07/13/2021   Procedure: COLONOSCOPY WITH PROPOFOL;  Surgeon: Wyline Mood, MD;  Location: [facility] ENDOSCOPY;  Service: Gastroenterology;  Laterality: N/A;   ESOPHAGOGASTRODUODENOSCOPY N/A 07/13/2021   Procedure: ESOPHAGOGASTRODUODENOSCOPY (EGD);  Surgeon: Wyline Mood, MD;  Location: [facility] ENDOSCOPY;  Service: Gastroenterology;  Laterality: N/A;   ESOPHAGOGASTRODUODENOSCOPY (EGD) WITH PROPOFOL N/A 10/13/2021   Procedure:  ESOPHAGOGASTRODUODENOSCOPY (EGD) WITH PROPOFOL;  Surgeon: Wyline Mood, MD;  Location: [facility] ENDOSCOPY;  Service: Gastroenterology;  Laterality: N/A;   ESOPHAGOGASTRODUODENOSCOPY (EGD) WITH PROPOFOL N/A 11/08/2021   Procedure: ESOPHAGOGASTRODUODENOSCOPY (EGD) WITH PROPOFOL;  Surgeon: Wyline Mood, MD;  Location: [facility] ENDOSCOPY;  Service: Gastroenterology;  Laterality: N/A;   ESOPHAGOGASTRODUODENOSCOPY (EGD) WITH PROPOFOL N/A 06/15/2022   Procedure: ESOPHAGOGASTRODUODENOSCOPY (EGD) WITH PROPOFOL;  Surgeon: Wyline Mood, MD;  Location: [facility] ENDOSCOPY;  Service: Gastroenterology;  Laterality: N/A;   LEG SURGERY  1988   8 surgeries-hips and legs for walking after getting polio- last one 1988   WRIST SURGERY     right     FAMILY HISTORY   Family History  Problem Relation Age of Onset   Cancer Mother        Breast   Arthritis Mother    Alcohol abuse Father    Arthritis Father    Hypertension Father    Cancer Maternal Grandmother        Breast   Hypertension Maternal Grandmother    Hypertension Maternal Grandfather    Hypertension Paternal Grandmother    Hypertension Paternal Grandfather    Drug abuse Maternal Uncle      SOCIAL HISTORY   Social History   Tobacco Use   Smoking status: Every Day    Current packs/day: 2.00    Average packs/day: 2.0 packs/day for 25.0 years (50.0  ttl pk-yrs)    Types: Cigarettes   Smokeless tobacco: Never  Vaping Use   Vaping status: Never Used  Substance Use Topics   Alcohol use: Yes    Alcohol/week: 70.0 standard drinks of alcohol    Types: 70 Cans of beer per week    Comment: 2 days ago   Drug use: No     MEDICATIONS    Home Medication:    Current Medication:  Current Facility-Administered Medications:    0.9 %  sodium chloride infusion, , Intravenous, PRN, Tresa Moore, MD, Stopped at 03/07/23 0511   benzonatate (TESSALON) capsule 200 mg, 200 mg, Oral, BID, Mikey College T, MD, 200 mg at 03/07/23 1018   cyclobenzaprine  (FLEXERIL) tablet 7.5 mg, 7.5 mg, Oral, TID PRN, Mikey College T, MD   enoxaparin (LOVENOX) injection 40 mg, 40 mg, Subcutaneous, Q24H, Chipper Herb, Ping T, MD, 40 mg at 03/07/23 6962   feeding supplement (ENSURE ENLIVE / ENSURE PLUS) liquid 237 mL, 237 mL, Oral, TID BM, Sreenath, Sudheer B, MD, 237 mL at 03/07/23 1314   ferrous sulfate tablet 325 mg, 325 mg, Oral, BID WC, Sreenath, Sudheer B, MD, 325 mg at 03/07/23 9528   folic acid (FOLVITE) tablet 1 mg, 1 mg, Oral, Daily, Mikey College T, MD, 1 mg at 03/07/23 1018   gabapentin (NEURONTIN) capsule 300 mg, 300 mg, Oral, TID, Mikey College T, MD, 300 mg at 03/07/23 1017   guaiFENesin (MUCINEX) 12 hr tablet 1,200 mg, 1,200 mg, Oral, BID, Mikey College T, MD, 1,200 mg at 03/07/23 1018   hydrALAZINE (APRESOLINE) injection 5 mg, 5 mg, Intravenous, Q6H PRN, Mikey College T, MD   hydrOXYzine (ATARAX) tablet 25 mg, 25 mg, Oral, Q8H PRN, Mikey College T, MD   LORazepam (ATIVAN) tablet 1-4 mg, 1-4 mg, Oral, Q1H PRN, 1 mg at 03/07/23 1024 **OR** LORazepam (ATIVAN) tablet 0.5 mg, 0.5 mg, Oral, Q4H PRN, Mikey College T, MD   multivitamin with minerals tablet 1 tablet, 1 tablet, Oral, Daily, Mikey College T, MD, 1 tablet at 03/07/23 1018   nicotine (NICODERM CQ - dosed in mg/24 hours) patch 21 mg, 21 mg, Transdermal, Daily, Mikey College T, MD, 21 mg at 03/07/23 1017   oxyCODONE (Oxy IR/ROXICODONE) immediate release tablet 5 mg, 5 mg, Oral, Q4H PRN, Mikey College T, MD, 5 mg at 03/07/23 0819   piperacillin-tazobactam (ZOSYN) IVPB 3.375 g, 3.375 g, Intravenous, Q8H, Odie Edmonds, MD, Last Rate: 12.5 mL/hr at 03/07/23 1313, 3.375 g at 03/07/23 1313   rifaximin (XIFAXAN) tablet 550 mg, 550 mg, Oral, BID, Mikey College T, MD, 550 mg at 03/07/23 1019   sodium chloride 0.9 % bolus 1,000 mL, 1,000 mL, Intravenous, Once, Wouk, Wilfred Curtis, MD   spiritus frumenti (ethyl alcohol) solution 1 each, 1 each, Oral, TID, Georgeann Oppenheim, Sudheer B, MD, 1 each at 03/07/23 1018   thiamine (VITAMIN B1) tablet  100 mg, 100 mg, Oral, Daily, 100 mg at 03/07/23 1017 **OR** thiamine (VITAMIN B1) injection 100 mg, 100 mg, Intravenous, Daily, Mikey College T, MD   traZODone (DESYREL) tablet 200 mg, 200 mg, Oral, QHS PRN, Emeline General, MD    ALLERGIES   Patient has no known allergies.     REVIEW OF SYSTEMS    Review of Systems:  Gen:  Denies  fever, sweats, chills weigh loss  HEENT: Denies blurred vision, double vision, ear pain, eye pain, hearing loss, nose bleeds, sore throat Cardiac:  No dizziness, chest pain or heaviness, chest tightness,edema Resp:  reports dyspnea chronically  Gi: Denies swallowing difficulty, stomach pain, nausea or vomiting, diarrhea, constipation, bowel incontinence Gu:  Denies bladder incontinence, burning urine Ext:   Denies Joint pain, stiffness or swelling Skin: Denies  skin rash, easy bruising or bleeding or hives Endoc:  Denies polyuria, polydipsia , polyphagia or weight change Psych:   Denies depression, insomnia or hallucinations   Other:  All other systems negative   VS: BP 95/75 (BP Location: Left Arm)   Pulse 77   Temp 97.8 F (36.6 C)   Resp 20   Ht 5\' 1"  (1.549 m)   Wt 46.3 kg   SpO2 100%   BMI 19.29 kg/m      PHYSICAL EXAM    GENERAL:NAD, no fevers, chills, no weakness no fatigue HEAD: Normocephalic, atraumatic.  EYES: Pupils equal, round, reactive to light. Extraocular muscles intact. No scleral icterus.  MOUTH: Moist mucosal membrane. Dentition intact. No abscess noted.  EAR, NOSE, THROAT: Clear without exudates. No external lesions.  NECK: Supple. No thyromegaly. No nodules. No JVD.  PULMONARY: decreased breath sounds with mild rhonchi worse at bases bilaterally.  CARDIOVASCULAR: S1 and S2. Regular rate and rhythm. No murmurs, rubs, or gallops. No edema. Pedal pulses 2+ bilaterally.  GASTROINTESTINAL: Soft, nontender, nondistended. No masses. Positive bowel sounds. No hepatosplenomegaly.  MUSCULOSKELETAL: No swelling, clubbing, or  edema. Range of motion full in all extremities.  NEUROLOGIC: Cranial nerves II through XII are intact. No gross focal neurological deficits. Sensation intact. Reflexes intact.  SKIN: No ulceration, lesions, rashes, or cyanosis. Skin warm and dry. Turgor intact.  PSYCHIATRIC: Mood, affect within normal limits. The patient is awake, alert and oriented x 3. Insight, judgment intact.       IMAGING       Impression  CLINICAL DATA:  Abnormal chest x-ray.  Lung mass.  Weight loss.   EXAM: CT CHEST WITH CONTRAST   TECHNIQUE: Multidetector CT imaging of the chest was performed during intravenous contrast administration.   RADIATION DOSE REDUCTION: This exam was performed according to the departmental dose-optimization program which includes automated exposure control, adjustment of the mA and/or kV according to patient size and/or use of iterative reconstruction technique.   CONTRAST:  75mL OMNIPAQUE IOHEXOL 300 MG/ML  SOLN   COMPARISON:  X-ray earlier 03/05/2023.  CT scan September 2015   FINDINGS: Cardiovascular: Heart is nonenlarged. No pericardial effusion. Normal caliber thoracic aorta.   Mediastinum/Nodes: No specific abnormal lymph node enlargement identified in the axillary regions. No abnormal nodes in the right hilum. There are some small left hilar and mediastinal nodes. Example left infrahilar region measures 15 x 7 mm. Precarinal node measures 8 mm in short axis on series 2, image 64. Normal caliber thoracic esophagus. Preserved thyroid gland.   Lungs/Pleura: Diffuse breathing motion. Right lung is grossly clear without consolidation, pneumothorax or effusion. There is a 3 mm right lower lobe lung nodule on series 2, image 91. Not clearly seen previously in 2015.   However the left lung has a area of consolidation left lower lobe with some air bronchograms, associated pleural thickening and a loculated pleural effusion with some air, loculated hydropneumothorax.  Overall this measures a proximally 9.3 x 2.6 cm in the axial plane and extends cephalocaudal of up to 19 cm proximally. This extends up to a cavitary lesion in the posterior left upper lobe abutting the pleura with thick irregular walls measuring 5.9 x 4.0 cm. Along the superior aspect of the consolidation left lower lobe is a nodular component  extending into the superior segment and slightly crossing the interlobar fissure. This has a nodular component with spiculations on series 2, image 80 measures 2.0 by 1.7 cm.   There are some small nodules elsewhere in the left lung such as series 2, image 60 measuring 5 mm. Other foci on image 66.   Upper Abdomen: Adrenal glands are preserved in the upper abdomen. Stone in the nondilated gallbladder.   Musculoskeletal: Curvature of the thoracic spine with some mild degenerative changes.   IMPRESSION: 5.9 cm cavitary mass in the left upper lobe abutting the pleura with irregular and thickened margins. Associated loculated left hydropneumothorax extending down to the diaphragm with consolidative opacity in the left lower lobe.   Additional nodular component seen adjacent to the consolidation in the superior segment left lower lobe which may cross the pleura.   Overall differential would include atypical infection such as TB versus neoplasm amongst other differential. Recommend additional workup.   Few separate tiny lung nodules elsewhere and some prominent lymph nodes in the mediastinum and left hilum. Attention on short follow-up.   Gallstones.     Electronically Signed   By: Karen Kays M.D.   On: 03/05/2023 16:54    ASSESSMENT/PLAN   Left hydropneumothorax - patient does have some risk factors for TB including alcoholism and sharing a home with many people who come and go.   -He has smoked his entire life and also has malignancy risk  -he has never had cancer but does have family history including mother with breast cancer  and grandmother with breast cancer and uncle with some kind of cancer. - patient would benefit from IR consultation and possible pleural drain.  He may need thoracic surgery evaluation.  -He has lost appx 40 lbs and is currently less then 100lbs with BMI <20.  This may be due to alcoholism vs cancer -He has hyponatremia and reports disequlibrium. 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  -currently he remain in empiric on Zosyn and Vancomycin IV with phramacy consultation -serum fungitell -legionella ab -strep pneumoniae ur AG -Histoplasma Ur Ag -sputum resp cultures -AFB sputum expectorated specimen -sputum cytology  -reviewed pertinent imaging with patient today - ESR/CRP/procalcitonin/MRSA PCR -please encourage patient to use incentive spirometer few times each hour while hospitalized.              Thank you for allowing me to participate in the care of this patient.   Patient/Family are satisfied with care plan and all questions have been answered.    Provider disclosure: Patient with at least one acute or chronic illness or injury that poses a threat to life or bodily function and is being managed actively during this encounter.  All of the below services have been performed independently by signing provider:  review of prior documentation from internal and or external health records.  Review of previous and current lab results.  Interview and comprehensive assessment during patient visit today. Review of current and previous chest radiographs/CT scans. Discussion of management and test interpretation with health care team and patient/family.   This document was prepared using Dragon voice recognition software and may include unintentional dictation errors.     Vida Rigger, M.D.  Division of Pulmonary & Critical Care Medicine

## 2023-03-07 NOTE — Progress Notes (Signed)
PROGRESS NOTE    Keith Parker  ZOX:[id number] DOB: 1975/09/03 DOA: 03/05/2023 PCP: Leanna Sato, MD    Brief Narrative:  47 y.o. male with medical history significant of alcoholic liver cirrhosis, polio with chronic right leg pain, anxiety/depression, presented with worsening of productive cough, loss of appetite, malaise and weight loss.   Symptoms started 6 months ago, patient started develop productive cough with thick brownish sputum, night> day, associated with loss of appetite and malaise.  He does not see any blood streaks in the sputum.  Denies any fever chills no night sweat.  He lost about 25 pounds from 120 to 95 pounds in 6 months.  Yesterday he went to see PCP who recommended patient come to ED for evaluation.  9/10: Seen in consultation by pulmonary.  Interventional radiology consultation requested.  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  Scheduled for IR pleural tap for microbiology and cytology t   Assessment & Plan:   Principal Problem:   PNA (pneumonia) Active Problems:   Alcoholic cirrhosis of liver with ascites (HCC)   Weight loss   Lobar pneumonia (HCC)   Hydropneumothorax   Protein-calorie malnutrition, severe   Left lung cavity pneumonia Bronchopleural fistula Hydropneumothorax Etiology unclear.  Infection and malignancy both on differential.  TB remains on differential given background of alcoholism and living in halfway house/group home.  Pulmonary following.  Interventional radiology engaged. Plan: IV Zosyn Follow-up quant of urine in ED Sputum for AFB every 8 hours x 3 Airborne isolation Fungitell Legionella antibody Strep pneumo urinary antigen Histoplasma urinary antigen Sputum respiratory cultures and cytology Inflammatory markers Stress incentive spirometry use IR pleural drainage tomorrow  Hyponatremia Appears euvolemic.  Possible SIADH secondary to lung lesion Sodium and osms are low, some degree of  dehydration probably contributing. Sodium improved to 131 today. Will give a bolus of ns see what that does   Anemia, normocytic Iron deficiency anemia Iron indices low Plan: IV Venofer 300 mg x 1   Liver cirrhosis -Blood pressure borderline low probably secondary to chronic poor nutrition and weight loss, hold off on Lasix and Aldactone -Continue rifaximin   Alcohol abuse Patient drinks approximately 40 ounces alcohol per day.  Not interested in quitting at this time. Plan: Will order patient beer in the hospital F/u hcv/rpr  Tobacco abuse Nicotine patch  DVT prophylaxis: SQ Lovenox Code Status: Full Family Communication: None Disposition Plan: Status is: Inpatient Remains inpatient appropriate because: Cavitary lung lesion   Level of care: Progressive  Consultants:  Pulmonary Interventional radiology  Procedures:  Pleural sampling 9/10  Antimicrobials: Zosyn   Subjective: Seen and examined.  Resting in bed. Complains of ongoing cough  Objective: Vitals:   03/06/23 2310 03/07/23 0000 03/07/23 0504 03/07/23 0750  BP: (!) 86/65  95/66 (!) 88/62  Pulse: 83  75 85  Resp: 18 16 18 15   Temp: 98.4 F (36.9 C)  97.9 F (36.6 C) 98.7 F (37.1 C)  TempSrc:   Oral   SpO2: 99%  100% 99%  Weight:      Height:        Intake/Output Summary (Last 24 hours) at 03/07/2023 1320 Last data filed at 03/07/2023 1100 Gross per 24 hour  Intake 626.09 ml  Output 545 ml  Net 81.09 ml   Filed Weights   03/05/23 2255  Weight: 46.3 kg    Examination:  General exam: NAD.  Appears frail and chronically ill Respiratory system: Scattered crackles bilaterally.  Normal  work of breathing.  Room air Cardiovascular system: S1-S2, RRR, no murmurs, no pedal edema Gastrointestinal system: Thin, soft, NT/ND, normal bowel sounds Central nervous system: Alert and oriented. No focal neurological deficits. Extremities: Symmetric 5 x 5 power. Skin: No rashes, lesions or  ulcers Psychiatry: Judgement and insight appear normal. Mood & affect appropriate.     Data Reviewed: I have personally reviewed following labs and imaging studies  CBC: Recent Labs  Lab 03/05/23 1403 03/06/23 0813 03/07/23 0516  WBC 16.4* 10.9* 9.7  NEUTROABS  --   --  7.1  HGB 9.2* 9.3* 9.0*  HCT 28.3* 28.4* 28.2*  MCV 88.7 87.1 89.5  PLT 433* 355 326   Basic Metabolic Panel: Recent Labs  Lab 03/05/23 1403 03/06/23 0810 03/07/23 0516  NA 124* 127* 131*  K 3.9 3.6 3.9  CL 92* 97* 101  CO2 22 23 25   GLUCOSE 93 98 87  BUN 5* 6 <5*  CREATININE 0.38* 0.36* 0.42*  CALCIUM 8.7* 8.2* 8.1*   GFR: Estimated Creatinine Clearance: 73.1 mL/min (A) (by C-G formula based on SCr of 0.42 mg/dL (L)). Liver Function Tests: Recent Labs  Lab 03/05/23 1403  AST 24  ALT 22  ALKPHOS 131*  BILITOT 0.4  PROT 6.8  ALBUMIN 2.3*   Recent Labs  Lab 03/05/23 1403  LIPASE 22   No results for input(s): "AMMONIA" in the last 168 hours. Coagulation Profile: Recent Labs  Lab 03/05/23 1403 03/07/23 0516  INR 1.2 1.3*   Cardiac Enzymes: No results for input(s): "CKTOTAL", "CKMB", "CKMBINDEX", "TROPONINI" in the last 168 hours. BNP (last 3 results) No results for input(s): "PROBNP" in the last 8760 hours. HbA1C: No results for input(s): "HGBA1C" in the last 72 hours. CBG: No results for input(s): "GLUCAP" in the last 168 hours. Lipid Profile: No results for input(s): "CHOL", "HDL", "LDLCALC", "TRIG", "CHOLHDL", "LDLDIRECT" in the last 72 hours. Thyroid Function Tests: No results for input(s): "TSH", "T4TOTAL", "FREET4", "T3FREE", "THYROIDAB" in the last 72 hours. Anemia Panel: Recent Labs    03/05/23 1928 03/06/23 1136  VITAMINB12  --  371  FOLATE  --  >40.0  FERRITIN 501*  --   TIBC 158*  --   IRON 19*  --   RETICCTPCT 1.9  --    Sepsis Labs: Recent Labs  Lab 03/05/23 1403 03/07/23 0516  PROCALCITON  --  0.11  LATICACIDVEN 1.0  --     Recent Results (from the  past 240 hour(s))  Blood Culture (routine x 2)     Status: None (Preliminary result)   Collection Time: 03/05/23  2:40 PM   Specimen: BLOOD  Result Value Ref Range Status   Specimen Description BLOOD BLOOD RIGHT ARM  Final   Special Requests   Final    BOTTLES DRAWN AEROBIC AND ANAEROBIC Blood Culture adequate volume   Culture   Final    NO GROWTH 2 DAYS Performed at [facility], [address], [place], [place] [address]    Report Status PENDING  Incomplete  Blood Culture (routine x 2)     Status: None (Preliminary result)   Collection Time: 03/05/23  2:45 PM   Specimen: BLOOD  Result Value Ref Range Status   Specimen Description BLOOD LEFT ANTECUBITAL  Final   Special Requests   Final    BOTTLES DRAWN AEROBIC AND ANAEROBIC Blood Culture adequate volume   Culture   Final    NO GROWTH 2 DAYS Performed at [facility], [address]  [place], [place] [address]    Report Status PENDING  Incomplete  MRSA Next Gen by PCR, Nasal     Status: None   Collection Time: 03/06/23  8:09 AM   Specimen: Nasal Mucosa; Nasal Swab  Result Value Ref Range Status   MRSA by PCR Next Gen NOT DETECTED NOT DETECTED Final    Comment: (NOTE) The GeneXpert MRSA Assay (FDA approved for NASAL specimens only), is one component of a comprehensive MRSA colonization surveillance program. It is not intended to diagnose MRSA infection nor to guide or monitor treatment for MRSA infections. Test performance is not FDA approved in patients less than 78 years old. Performed at [facility], [address] [place], [place] [address]   Expectorated Sputum Assessment w Gram Stain, Rflx to Resp Cult     Status: None   Collection Time: 03/06/23  1:50 PM   Specimen: Sputum  Result Value Ref Range Status   Specimen Description SPUTUM  Final   Special Requests NONE  Final   Sputum evaluation   Final    THIS SPECIMEN IS ACCEPTABLE FOR SPUTUM CULTURE Performed at [facility],  [address], [place], [place] [address]    Report Status 03/06/2023 FINAL  Final  Culture, Respiratory w Gram Stain     Status: None (Preliminary result)   Collection Time: 03/06/23  1:50 PM   Specimen: SPU  Result Value Ref Range Status   Specimen Description   Final    SPUTUM Performed at [facility], [address], [place], [place] [address]    Special Requests   Final    NONE Reflexed from [id number] Performed at [facility], [address] [place], [place] [address]    Gram Stain   Final    MODERATE WBC PRESENT,BOTH PMN AND MONONUCLEAR RARE GRAM NEGATIVE RODS    Culture   Final    NO GROWTH < 12 HOURS Performed at [facility], [address] [place], [place] [address]    Report Status PENDING  Incomplete         Radiology Studies: MR BRAIN W WO CONTRAST  Result Date: 03/06/2023 CLINICAL DATA:  New diagnosis of lung mass. EXAM: MRI HEAD WITHOUT AND WITH CONTRAST TECHNIQUE: Multiplanar, multiecho pulse sequences of the brain and surrounding structures were obtained without and with intravenous contrast. CONTRAST:  5mL GADAVIST GADOBUTROL 1 MMOL/ML IV SOLN COMPARISON:  CT head 04/03/2009 FINDINGS: Brain: There is no acute intracranial hemorrhage, extra-axial fluid collection, or acute infarct. Parenchymal volume is normal. The ventricles are normal in size. Patchy FLAIR signal abnormality in the pons is nonspecific but may reflect sequela of chronic small-vessel ischemic change or other prior insult. Parenchymal signal is otherwise normal. The pituitary and suprasellar region are normal. There is no mass lesion or abnormal enhancement. There is no mass effect or midline shift. Vascular: Normal flow voids. Skull and upper cervical spine: Normal marrow signal. Sinuses/Orbits: There is moderate mucosal thickening throughout the paranasal sinuses. The globes and orbits are unremarkable. Other: There are trace bilateral mastoid effusions. The imaged  nasopharynx is unremarkable. IMPRESSION: No evidence of intracranial metastatic disease. Electronically Signed   By: Lesia Hausen M.D.   On: 03/06/2023 10:58   CT Chest W Contrast  Result Date: 03/05/2023 CLINICAL DATA:  Abnormal chest x-ray.  Lung mass.  Weight loss. EXAM: CT CHEST WITH CONTRAST TECHNIQUE: Multidetector CT imaging of the chest was performed during intravenous contrast administration. RADIATION DOSE REDUCTION: This exam was  performed according to the departmental dose-optimization program which includes automated exposure control, adjustment of the mA and/or kV according to patient size and/or use of iterative reconstruction technique. CONTRAST:  75mL OMNIPAQUE IOHEXOL 300 MG/ML  SOLN COMPARISON:  X-ray earlier 03/05/2023.  CT scan September 2015 FINDINGS: Cardiovascular: Heart is nonenlarged. No pericardial effusion. Normal caliber thoracic aorta. Mediastinum/Nodes: No specific abnormal lymph node enlargement identified in the axillary regions. No abnormal nodes in the right hilum. There are some small left hilar and mediastinal nodes. Example left infrahilar region measures 15 x 7 mm. Precarinal node measures 8 mm in short axis on series 2, image 64. Normal caliber thoracic esophagus. Preserved thyroid gland. Lungs/Pleura: Diffuse breathing motion. Right lung is grossly clear without consolidation, pneumothorax or effusion. There is a 3 mm right lower lobe lung nodule on series 2, image 91. Not clearly seen previously in 2015. However the left lung has a area of consolidation left lower lobe with some air bronchograms, associated pleural thickening and a loculated pleural effusion with some air, loculated hydropneumothorax. Overall this measures a proximally 9.3 x 2.6 cm in the axial plane and extends cephalocaudal of up to 19 cm proximally. This extends up to a cavitary lesion in the posterior left upper lobe abutting the pleura with thick irregular walls measuring 5.9 x 4.0 cm. Along the  superior aspect of the consolidation left lower lobe is a nodular component extending into the superior segment and slightly crossing the interlobar fissure. This has a nodular component with spiculations on series 2, image 80 measures 2.0 by 1.7 cm. There are some small nodules elsewhere in the left lung such as series 2, image 60 measuring 5 mm. Other foci on image 66. Upper Abdomen: Adrenal glands are preserved in the upper abdomen. Stone in the nondilated gallbladder. Musculoskeletal: Curvature of the thoracic spine with some mild degenerative changes. IMPRESSION: 5.9 cm cavitary mass in the left upper lobe abutting the pleura with irregular and thickened margins. Associated loculated left hydropneumothorax extending down to the diaphragm with consolidative opacity in the left lower lobe. Additional nodular component seen adjacent to the consolidation in the superior segment left lower lobe which may cross the pleura. Overall differential would include atypical infection such as TB versus neoplasm amongst other differential. Recommend additional workup. Few separate tiny lung nodules elsewhere and some prominent lymph nodes in the mediastinum and left hilum. Attention on short follow-up. Gallstones. Electronically Signed   By: Karen Kays M.D.   On: 03/05/2023 16:54   DG Chest 2 View  Result Date: 03/05/2023 CLINICAL DATA:  25 pound weight loss, cough, dyspnea EXAM: CHEST - 2 VIEW COMPARISON:  01/01/2022 FINDINGS: Normal cardiac and mediastinal contours. New left upper lobe opacity. Moderate left pleural effusion with associated atelectasis. No right lung opacity or pleural effusion. No definite pneumothorax. No acute osseous abnormality. IMPRESSION: 1. New left upper lobe opacity, concerning for pneumonia, although a mass could appear similar. Correlate with same day chest CT. 2. Moderate left pleural effusion with associated atelectasis. Electronically Signed   By: Wiliam Ke M.D.   On: 03/05/2023 15:56         Scheduled Meds:  benzonatate  200 mg Oral BID   enoxaparin (LOVENOX) injection  40 mg Subcutaneous Q24H   feeding supplement  237 mL Oral TID BM   ferrous sulfate  325 mg Oral BID WC   folic acid  1 mg Oral Daily   gabapentin  300 mg Oral TID   guaiFENesin  1,200  mg Oral BID   multivitamin with minerals  1 tablet Oral Daily   nicotine  21 mg Transdermal Daily   rifaximin  550 mg Oral BID   spiritus frumenti  1 each Oral TID   thiamine  100 mg Oral Daily   Or   thiamine  100 mg Intravenous Daily   Continuous Infusions:  sodium chloride Stopped (03/07/23 0511)   piperacillin-tazobactam (ZOSYN)  IV 3.375 g (03/07/23 1313)   vancomycin 500 mg (03/07/23 1025)     LOS: 2 days     Silvano Bilis, MD [facility]   If 7PM-7AM, please contact night-coverage  03/07/2023, 1:20 PM

## 2023-03-07 NOTE — Consult Note (Addendum)
Pharmacy Antibiotic Note  Keith Parker is a 47 y.o. male admitted on 03/05/2023 with pneumonia. PMH significant for alcoholic liver cirrhosis, polio with chronic right leg pain, anxiety, depression. Patient found to have left hydropneumothorax. CT chest found consolidation in left lung concerning for atypical infection such as TB versus neoplasm amongst other differential. Patient has some risk factors for TB including alcoholism and sharing a home with many people who come and go. Pharmacy has been consulted for vancomycin and Zosyn dosing.  Plan: Day 3 of antibiotics Continue vancomycin 500 mg IV Q24H. Goal AUC 400-550. Expected AUC: 460.7 Expected Css min: 13.1 SCr used: 0.80 (actual 0.42)  Weight used: TBW (TBW < IBW), Vd used: 0.72 (BMI 19.2) Continue Zosyn 3.375 g IV Q8H Continue to monitor renal function and follow culture results   Height: 5\' 1"  (154.9 cm) Weight: 46.3 kg (102 lb 1.6 oz) IBW/kg (Calculated) : 52.3  Temp (24hrs), Avg:98.4 F (36.9 C), Min:97.9 F (36.6 C), Max:99.4 F (37.4 C)  Recent Labs  Lab 03/05/23 1403 03/06/23 0810 03/06/23 0813 03/07/23 0516  WBC 16.4*  --  10.9* 9.7  CREATININE 0.38* 0.36*  --  0.42*  LATICACIDVEN 1.0  --   --   --     Estimated Creatinine Clearance: 73.1 mL/min (A) (by C-G formula based on SCr of 0.42 mg/dL (L)).    No Known Allergies  Antimicrobials this admission: 9/9 Azithromycin x1 9/9 Ceftriaxone x1 9/10 Vancomycin >> 9/10 Zosyn >>  Dose adjustments this admission: N/A  Microbiology results: 9/9 BCx: NG<24H 9/10 AFB ordered 9/10 MRSA PCR: negative  9/10 AFB Sputum Cx: IP 9/10 Sputum Cx: rare GNR  Thank you for allowing pharmacy to be a part of this patient's care.  Celene Squibb, PharmD Clinical Pharmacist 03/07/2023 9:48 AM

## 2023-03-08 ENCOUNTER — Other Ambulatory Visit: Payer: Self-pay

## 2023-03-08 ENCOUNTER — Inpatient Hospital Stay: Payer: Medicare Other

## 2023-03-08 DIAGNOSIS — J984 Other disorders of lung: Secondary | ICD-10-CM | POA: Diagnosis not present

## 2023-03-08 DIAGNOSIS — J189 Pneumonia, unspecified organism: Secondary | ICD-10-CM | POA: Diagnosis not present

## 2023-03-08 LAB — BRAIN NATRIURETIC PEPTIDE: B Natriuretic Peptide: 157.6 pg/mL — ABNORMAL HIGH (ref 0.0–100.0)

## 2023-03-08 LAB — BASIC METABOLIC PANEL
Anion gap: 9 (ref 5–15)
BUN: 6 mg/dL (ref 6–20)
CO2: 24 mmol/L (ref 22–32)
Calcium: 8.2 mg/dL — ABNORMAL LOW (ref 8.9–10.3)
Chloride: 101 mmol/L (ref 98–111)
Creatinine, Ser: 0.53 mg/dL — ABNORMAL LOW (ref 0.61–1.24)
GFR, Estimated: >60 mL/min (ref >60)
Glucose, Bld: 100 mg/dL — ABNORMAL HIGH (ref 70–99)
Potassium: 4 mmol/L (ref 3.5–5.1)
Sodium: 134 mmol/L — ABNORMAL LOW (ref 135–145)

## 2023-03-08 LAB — CBC
HCT: 23.1 % — ABNORMAL LOW (ref 39.0–52.0)
Hemoglobin: 7.3 g/dL — ABNORMAL LOW (ref 13.0–17.0)
MCH: 28.7 pg (ref 26.0–34.0)
MCHC: 31.6 g/dL (ref 30.0–36.0)
MCV: 90.9 fL (ref 80.0–100.0)
Platelets: 301 10*3/uL (ref 150–400)
RBC: 2.54 MIL/uL — ABNORMAL LOW (ref 4.22–5.81)
RDW: 15.6 % — ABNORMAL HIGH (ref 11.5–15.5)
WBC: 9.6 10*3/uL (ref 4.0–10.5)
nRBC: 0 % (ref 0.0–0.2)

## 2023-03-08 LAB — RPR: RPR Ser Ql: NONREACTIVE

## 2023-03-08 LAB — ACID FAST SMEAR (AFB, MYCOBACTERIA): Acid Fast Smear: NEGATIVE

## 2023-03-08 LAB — PROCALCITONIN: Procalcitonin: 0.12 ng/mL

## 2023-03-08 LAB — C-REACTIVE PROTEIN: CRP: 10 mg/dL — ABNORMAL HIGH (ref <1.0)

## 2023-03-08 LAB — HEMOGLOBIN AND HEMATOCRIT, BLOOD
HCT: 24 % — ABNORMAL LOW (ref 39.0–52.0)
Hemoglobin: 7.6 g/dL — ABNORMAL LOW (ref 13.0–17.0)

## 2023-03-08 LAB — LACTIC ACID, PLASMA: Lactic Acid, Venous: 2.4 mmol/L (ref 0.5–1.9)

## 2023-03-08 MED ORDER — SODIUM CHLORIDE 0.9 % IV BOLUS
1000.0000 mL | Freq: Once | INTRAVENOUS | Status: AC
  Administered 2023-03-08: 1000 mL via INTRAVENOUS

## 2023-03-08 MED ORDER — FENTANYL CITRATE (PF) 100 MCG/2ML IJ SOLN
INTRAMUSCULAR | Status: AC | PRN
Start: 2023-03-08 — End: 2023-03-08
  Administered 2023-03-08 (×2): 50 ug via INTRAVENOUS

## 2023-03-08 MED ORDER — MIDAZOLAM HCL 5 MG/5ML IJ SOLN
INTRAMUSCULAR | Status: AC | PRN
Start: 2023-03-08 — End: 2023-03-08
  Administered 2023-03-08: 1 mg via INTRAVENOUS
  Administered 2023-03-08: .5 mg via INTRAVENOUS

## 2023-03-08 MED ORDER — OXYCODONE HCL 5 MG PO TABS
5.0000 mg | ORAL_TABLET | Freq: Once | ORAL | Status: AC
  Administered 2023-03-08: 5 mg via ORAL
  Filled 2023-03-08: qty 1

## 2023-03-08 MED ORDER — MIDAZOLAM HCL 2 MG/2ML IJ SOLN
INTRAMUSCULAR | Status: AC | PRN
Start: 2023-03-08 — End: 2023-03-08
  Administered 2023-03-08: .5 mg via INTRAVENOUS

## 2023-03-08 MED ORDER — MIDAZOLAM HCL 2 MG/2ML IJ SOLN
INTRAMUSCULAR | Status: AC
  Filled 2023-03-08: qty 2

## 2023-03-08 MED ORDER — LIDOCAINE HCL 1 % IJ SOLN
INTRAMUSCULAR | Status: AC | PRN
  Administered 2023-03-08: 10 mL

## 2023-03-08 MED ORDER — KETOROLAC TROMETHAMINE 15 MG/ML IJ SOLN
15.0000 mg | Freq: Three times a day (TID) | INTRAMUSCULAR | Status: AC | PRN
  Administered 2023-03-08 – 2023-03-12 (×10): 15 mg via INTRAVENOUS
  Filled 2023-03-08 (×11): qty 1

## 2023-03-08 MED ORDER — LIDOCAINE HCL (PF) 1 % IJ SOLN
10.0000 mL | Freq: Once | INTRAMUSCULAR | Status: DC
  Filled 2023-03-08: qty 10

## 2023-03-08 MED ORDER — POLYETHYLENE GLYCOL 3350 17 G PO PACK
17.0000 g | PACK | Freq: Every day | ORAL | Status: DC
  Administered 2023-03-08: 17 g via ORAL
  Filled 2023-03-08 (×2): qty 1

## 2023-03-08 MED ORDER — FENTANYL CITRATE (PF) 100 MCG/2ML IJ SOLN
INTRAMUSCULAR | Status: AC
  Filled 2023-03-08: qty 2

## 2023-03-08 NOTE — Progress Notes (Signed)
Patient clinically stable post 12FR CT placement per Dr Milford Cage, tolerated well, Versed 2 mg along with Fentanyl 100 mcg IV given for procedure and appeared comfortable for procedure, though awake for procedure. Vitals stable pre and post procedure. Tan purulent drainage from chest tube placement. Report given to Audrey/RN post procedure at bedside post procedure/recovery.

## 2023-03-08 NOTE — Progress Notes (Signed)
New sputum sample sent to lab.

## 2023-03-08 NOTE — Progress Notes (Signed)
Report called to specials. Pt to have chest tube placement today. Has been npo since mn.

## 2023-03-08 NOTE — Progress Notes (Signed)
PULMONOLOGY         Date: 03/08/2023,   MRN# [id number] MANCIL NOIA 24-Jan-1976     AdmissionWeight: 46.3 kg                 CurrentWeight: 46.3 kg  Referring provider: Dr Mikey College   CHIEF COMPLAINT:   Hydropneumothorax   HISTORY OF PRESENT ILLNESS   Patient reports exposure to black mold "very heavy" appx 1-2 years ago.  He has lived in this house with mold for appx 3 years.  He smokes actively, he smokes tobacco and THC.  He denies any other substances but admits to drinking alcohol.  He drinks 40Oz. Of beer daily.    03/07/23- patient is able to eat and is breathing on room air. Reports cough is worse. He has IR consult for chest tube but shares procedure was postponed.  He is on antibiotics with zosyn and vanco.  MRSA is negative. Sputum culture is negative and procalcitonin is flat both indicative of possible cancer as main etiology.   03/08/23- patient for chest tube today. CRP remains elevated and he may still require throacic surgery evaluation   PAST MEDICAL HISTORY   Past Medical History:  Diagnosis Date   Allergy    Arthritis    Depression    Elevated liver enzymes 10/29/2017   GERD (gastroesophageal reflux disease)    Hearing loss, conductive, bilateral 12/31/2017   Hypertension    Polio    Seizures (HCC)    last seizure about 7 years ago.    Substance abuse (HCC)    Alcohol abuse     SURGICAL HISTORY   Past Surgical History:  Procedure Laterality Date   COLONOSCOPY N/A 06/15/2022   Procedure: COLONOSCOPY;  Surgeon: Wyline Mood, MD;  Location: [facility] ENDOSCOPY;  Service: Gastroenterology;  Laterality: N/A;   COLONOSCOPY WITH PROPOFOL N/A 07/13/2021   Procedure: COLONOSCOPY WITH PROPOFOL;  Surgeon: Wyline Mood, MD;  Location: [facility] ENDOSCOPY;  Service: Gastroenterology;  Laterality: N/A;   ESOPHAGOGASTRODUODENOSCOPY N/A 07/13/2021   Procedure: ESOPHAGOGASTRODUODENOSCOPY (EGD);  Surgeon: Wyline Mood, MD;  Location: [facility] ENDOSCOPY;  Service:  Gastroenterology;  Laterality: N/A;   ESOPHAGOGASTRODUODENOSCOPY (EGD) WITH PROPOFOL N/A 10/13/2021   Procedure: ESOPHAGOGASTRODUODENOSCOPY (EGD) WITH PROPOFOL;  Surgeon: Wyline Mood, MD;  Location: [facility] ENDOSCOPY;  Service: Gastroenterology;  Laterality: N/A;   ESOPHAGOGASTRODUODENOSCOPY (EGD) WITH PROPOFOL N/A 11/08/2021   Procedure: ESOPHAGOGASTRODUODENOSCOPY (EGD) WITH PROPOFOL;  Surgeon: Wyline Mood, MD;  Location: [facility] ENDOSCOPY;  Service: Gastroenterology;  Laterality: N/A;   ESOPHAGOGASTRODUODENOSCOPY (EGD) WITH PROPOFOL N/A 06/15/2022   Procedure: ESOPHAGOGASTRODUODENOSCOPY (EGD) WITH PROPOFOL;  Surgeon: Wyline Mood, MD;  Location: [facility] ENDOSCOPY;  Service: Gastroenterology;  Laterality: N/A;   LEG SURGERY  1988   8 surgeries-hips and legs for walking after getting polio- last one 1988   WRIST SURGERY     right     FAMILY HISTORY   Family History  Problem Relation Age of Onset   Cancer Mother        Breast   Arthritis Mother    Alcohol abuse Father    Arthritis Father    Hypertension Father    Cancer Maternal Grandmother        Breast   Hypertension Maternal Grandmother    Hypertension Maternal Grandfather    Hypertension Paternal Grandmother    Hypertension Paternal Grandfather    Drug abuse Maternal Uncle      SOCIAL HISTORY   Social History   Tobacco Use   Smoking status:  Every Day    Current packs/day: 2.00    Average packs/day: 2.0 packs/day for 25.0 years (50.0 ttl pk-yrs)    Types: Cigarettes   Smokeless tobacco: Never  Vaping Use   Vaping status: Never Used  Substance Use Topics   Alcohol use: Yes    Alcohol/week: 70.0 standard drinks of alcohol    Types: 70 Cans of beer per week    Comment: 2 days ago   Drug use: No     MEDICATIONS    Home Medication:    Current Medication:  Current Facility-Administered Medications:    0.9 %  sodium chloride infusion, , Intravenous, PRN, Tresa Moore, MD, Stopped at 03/07/23 1818   benzonatate  (TESSALON) capsule 200 mg, 200 mg, Oral, BID, Mikey College T, MD, 200 mg at 03/07/23 2213   cyclobenzaprine (FLEXERIL) tablet 7.5 mg, 7.5 mg, Oral, TID PRN, Mikey College T, MD   enoxaparin (LOVENOX) injection 40 mg, 40 mg, Subcutaneous, Q24H, Chipper Herb, Ping T, MD, 40 mg at 03/07/23 0981   feeding supplement (ENSURE ENLIVE / ENSURE PLUS) liquid 237 mL, 237 mL, Oral, TID BM, Sreenath, Sudheer B, MD, 237 mL at 03/07/23 1314   fentaNYL (SUBLIMAZE) 100 MCG/2ML injection, , , ,    ferrous sulfate tablet 325 mg, 325 mg, Oral, BID WC, Sreenath, Sudheer B, MD, 325 mg at 03/07/23 1633   folic acid (FOLVITE) tablet 1 mg, 1 mg, Oral, Daily, Chipper Herb, Ping T, MD, 1 mg at 03/07/23 1018   gabapentin (NEURONTIN) capsule 300 mg, 300 mg, Oral, TID, Mikey College T, MD, 300 mg at 03/07/23 2213   guaiFENesin (MUCINEX) 12 hr tablet 1,200 mg, 1,200 mg, Oral, BID, Mikey College T, MD, 1,200 mg at 03/07/23 2213   hydrOXYzine (ATARAX) tablet 25 mg, 25 mg, Oral, Q8H PRN, Mikey College T, MD, 25 mg at 03/07/23 2213   lidocaine (PF) (XYLOCAINE) 1 % injection 10 mL, 10 mL, Intradermal, Once, Mugweru, Jon, MD   LORazepam (ATIVAN) tablet 1-4 mg, 1-4 mg, Oral, Q1H PRN, 1 mg at 03/07/23 1024 **OR** LORazepam (ATIVAN) tablet 0.5 mg, 0.5 mg, Oral, Q4H PRN, Mikey College T, MD   midazolam (VERSED) 2 MG/2ML injection, , , ,    multivitamin with minerals tablet 1 tablet, 1 tablet, Oral, Daily, Mikey College T, MD, 1 tablet at 03/07/23 1018   nicotine (NICODERM CQ - dosed in mg/24 hours) patch 21 mg, 21 mg, Transdermal, Daily, Mikey College T, MD, 21 mg at 03/07/23 1017   oxyCODONE (Oxy IR/ROXICODONE) immediate release tablet 5 mg, 5 mg, Oral, Q4H PRN, Mikey College T, MD, 5 mg at 03/07/23 2213   piperacillin-tazobactam (ZOSYN) IVPB 3.375 g, 3.375 g, Intravenous, Q8H, Amberli Ruegg, MD, Last Rate: 12.5 mL/hr at 03/08/23 0627, 3.375 g at 03/08/23 1914   rifaximin (XIFAXAN) tablet 550 mg, 550 mg, Oral, BID, Mikey College T, MD, 550 mg at 03/07/23 2213    spiritus frumenti (ethyl alcohol) solution 1 each, 1 each, Oral, TID, Georgeann Oppenheim, Sudheer B, MD, 1 each at 03/07/23 2218   thiamine (VITAMIN B1) tablet 100 mg, 100 mg, Oral, Daily, 100 mg at 03/07/23 1017 **OR** thiamine (VITAMIN B1) injection 100 mg, 100 mg, Intravenous, Daily, Mikey College T, MD   traZODone (DESYREL) tablet 200 mg, 200 mg, Oral, QHS PRN, Mikey College T, MD, 200 mg at 03/07/23 2213    ALLERGIES   Patient has no known allergies.     REVIEW OF SYSTEMS    Review of Systems:  Gen:  Denies  fever, sweats, chills weigh loss  HEENT: Denies blurred vision, double vision, ear pain, eye pain, hearing loss, nose bleeds, sore throat Cardiac:  No dizziness, chest pain or heaviness, chest tightness,edema Resp:   reports dyspnea chronically  Gi: Denies swallowing difficulty, stomach pain, nausea or vomiting, diarrhea, constipation, bowel incontinence Gu:  Denies bladder incontinence, burning urine Ext:   Denies Joint pain, stiffness or swelling Skin: Denies  skin rash, easy bruising or bleeding or hives Endoc:  Denies polyuria, polydipsia , polyphagia or weight change Psych:   Denies depression, insomnia or hallucinations   Other:  All other systems negative   VS: BP (!) 92/55   Pulse 85   Temp 98.6 F (37 C)   Resp 19   Ht 5\' 1"  (1.549 m)   Wt 46.3 kg   SpO2 100%   BMI 19.29 kg/m      PHYSICAL EXAM    GENERAL:NAD, no fevers, chills, no weakness no fatigue HEAD: Normocephalic, atraumatic.  EYES: Pupils equal, round, reactive to light. Extraocular muscles intact. No scleral icterus.  MOUTH: Moist mucosal membrane. Dentition intact. No abscess noted.  EAR, NOSE, THROAT: Clear without exudates. No external lesions.  NECK: Supple. No thyromegaly. No nodules. No JVD.  PULMONARY: decreased breath sounds with mild rhonchi worse at bases bilaterally.  CARDIOVASCULAR: S1 and S2. Regular rate and rhythm. No murmurs, rubs, or gallops. No edema. Pedal pulses 2+ bilaterally.   GASTROINTESTINAL: Soft, nontender, nondistended. No masses. Positive bowel sounds. No hepatosplenomegaly.  MUSCULOSKELETAL: No swelling, clubbing, or edema. Range of motion full in all extremities.  NEUROLOGIC: Cranial nerves II through XII are intact. No gross focal neurological deficits. Sensation intact. Reflexes intact.  SKIN: No ulceration, lesions, rashes, or cyanosis. Skin warm and dry. Turgor intact.  PSYCHIATRIC: Mood, affect within normal limits. The patient is awake, alert and oriented x 3. Insight, judgment intact.       IMAGING       Impression  CLINICAL DATA:  Abnormal chest x-ray.  Lung mass.  Weight loss.   EXAM: CT CHEST WITH CONTRAST   TECHNIQUE: Multidetector CT imaging of the chest was performed during intravenous contrast administration.   RADIATION DOSE REDUCTION: This exam was performed according to the departmental dose-optimization program which includes automated exposure control, adjustment of the mA and/or kV according to patient size and/or use of iterative reconstruction technique.   CONTRAST:  75mL OMNIPAQUE IOHEXOL 300 MG/ML  SOLN   COMPARISON:  X-ray earlier 03/05/2023.  CT scan September 2015   FINDINGS: Cardiovascular: Heart is nonenlarged. No pericardial effusion. Normal caliber thoracic aorta.   Mediastinum/Nodes: No specific abnormal lymph node enlargement identified in the axillary regions. No abnormal nodes in the right hilum. There are some small left hilar and mediastinal nodes. Example left infrahilar region measures 15 x 7 mm. Precarinal node measures 8 mm in short axis on series 2, image 64. Normal caliber thoracic esophagus. Preserved thyroid gland.   Lungs/Pleura: Diffuse breathing motion. Right lung is grossly clear without consolidation, pneumothorax or effusion. There is a 3 mm right lower lobe lung nodule on series 2, image 91. Not clearly seen previously in 2015.   However the left lung has a area of consolidation  left lower lobe with some air bronchograms, associated pleural thickening and a loculated pleural effusion with some air, loculated hydropneumothorax. Overall this measures a proximally 9.3 x 2.6 cm in the axial plane and extends cephalocaudal of up to 19 cm proximally. This extends up to a cavitary lesion  in the posterior left upper lobe abutting the pleura with thick irregular walls measuring 5.9 x 4.0 cm. Along the superior aspect of the consolidation left lower lobe is a nodular component extending into the superior segment and slightly crossing the interlobar fissure. This has a nodular component with spiculations on series 2, image 80 measures 2.0 by 1.7 cm.   There are some small nodules elsewhere in the left lung such as series 2, image 60 measuring 5 mm. Other foci on image 66.   Upper Abdomen: Adrenal glands are preserved in the upper abdomen. Stone in the nondilated gallbladder.   Musculoskeletal: Curvature of the thoracic spine with some mild degenerative changes.   IMPRESSION: 5.9 cm cavitary mass in the left upper lobe abutting the pleura with irregular and thickened margins. Associated loculated left hydropneumothorax extending down to the diaphragm with consolidative opacity in the left lower lobe.   Additional nodular component seen adjacent to the consolidation in the superior segment left lower lobe which may cross the pleura.   Overall differential would include atypical infection such as TB versus neoplasm amongst other differential. Recommend additional workup.   Few separate tiny lung nodules elsewhere and some prominent lymph nodes in the mediastinum and left hilum. Attention on short follow-up.   Gallstones.     Electronically Signed   By: Karen Kays M.D.   On: 03/05/2023 16:54    ASSESSMENT/PLAN   Left hydropneumothorax - patient does have some risk factors for TB including alcoholism and sharing a home with many people who come and go.    -He has smoked his entire life and also has malignancy risk  -he has never had cancer but does have family history including mother with breast cancer and grandmother with breast cancer and uncle with some kind of cancer. - patient would benefit from IR consultation and possible pleural drain.  He may need thoracic surgery evaluation.  -He has lost appx 40 lbs and is currently less then 100lbs with BMI <20.  This may be due to alcoholism vs cancer -He has hyponatremia and reports disequlibrium. 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  -currently he remain in empiric on Zosyn and Vancomycin IV with phramacy consultation -serum fungitell -legionella ab -strep pneumoniae ur AG -Histoplasma Ur Ag -sputum resp cultures -AFB sputum expectorated specimen -sputum cytology  -reviewed pertinent imaging with patient today - ESR/CRP/procalcitonin/MRSA PCR -please encourage patient to use incentive spirometer few times each hour while hospitalized.              Thank you for allowing me to participate in the care of this patient.   Patient/Family are satisfied with care plan and all questions have been answered.    Provider disclosure: Patient with at least one acute or chronic illness or injury that poses a threat to life or bodily function and is being managed actively during this encounter.  All of the below services have been performed independently by signing provider:  review of prior documentation from internal and or external health records.  Review of previous and current lab results.  Interview and comprehensive assessment during patient visit today. Review of current and previous chest radiographs/CT scans. Discussion of management and test interpretation with health care team and patient/family.   This document was prepared using Dragon voice recognition software and may include unintentional dictation errors.     Vida Rigger, M.D.  Division of Pulmonary & Critical Care Medicine

## 2023-03-08 NOTE — Procedures (Signed)
Vascular and Interventional Radiology Procedure Note  Patient: Keith Parker DOB: [birth year] Medical Record Number: [id number] Note Date/Time: 03/08/23 1:00 PM   Performing Physician: Roanna Banning, MD Assistant(s): None  Diagnosis: Empyema vs Loculated LEFT pleural effusion  Procedure:  LEFT PERCUTANEOUS THORACOSTOMY DRAINAGE CATHETER PLACEMENT   Anesthesia: Conscious Sedation Complications: None Estimated Blood Loss: Minimal Specimens: Sent for Gram Stain, Aerobe Culture, and Anerobe Culture  Findings:  Successful CT-guided placement of 12 F catheter into LEFT chest.  Plan:  - Care (and if potential fibrinolysis) per Pulmonary team. - Chest tube to -20 cm H20 suction. Additional management per Primary.  See detailed procedure note with images in PACS. The patient tolerated the procedure well without incident or complication and was returned to Recovery in stable condition.    Roanna Banning, MD [facility] Radiology   Pager. [phone] Clinic. [phone]

## 2023-03-08 NOTE — Plan of Care (Signed)
progressing 

## 2023-03-08 NOTE — Progress Notes (Signed)
PROGRESS NOTE    Keith Parker  UJW:[id number] DOB: Sep 05, 1975 DOA: 03/05/2023 PCP: Leanna Sato, MD    Brief Narrative:  47 y.o. male with medical history significant of alcoholic liver cirrhosis, polio with chronic right leg pain, anxiety/depression, presented with worsening of productive cough, loss of appetite, malaise and weight loss.   Symptoms started 6 months ago, patient started develop productive cough with thick brownish sputum, night> day, associated with loss of appetite and malaise.  He does not see any blood streaks in the sputum.  Denies any fever chills no night sweat.  He lost about 25 pounds from 120 to 95 pounds in 6 months.  Yesterday he went to see PCP who recommended patient come to ED for evaluation.  9/10: Seen in consultation by pulmonary.  Interventional radiology consultation requested.  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      Assessment & Plan:   Principal Problem:   Cavitary pneumonia Active Problems:   Alcoholic cirrhosis of liver with ascites (HCC)   Weight loss   Lobar pneumonia (HCC)   Hydropneumothorax   Protein-calorie malnutrition, severe   Left lung cavity pneumonia Bronchopleural fistula Hydropneumothorax Etiology unclear.  Infection and malignancy both on differential.  TB remains on differential given background of alcoholism and living in halfway house/group home.  Pulmonary following.  Interventional radiology engaged. Malignancy remains on ddx Plan: IV Zosyn Follow-up quant of urine in ED Sputum for AFB every 8 hours x 3, results pending Airborne isolation Fungitell Legionella neg Strep pneumo urinary antigen neg Histoplasma urinary antigen Sputum respiratory culture normal growth Stress incentive spirometry use IR chest tube placed today, culture pending  Hypotension Today worse after procedure other hemodynamics stable and patient is asymptomatic - f/u cbc, lactate - will bolus one  liter  Hyponatremia Appears euvolemic.  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  Not interested in quitting at this time. No s/s withdrawal Plan: Will order patient beer in the hospital F/u hcv   Tobacco abuse Nicotine patch  DVT prophylaxis: SQ Lovenox Code Status: Full Family Communication: None Disposition Plan: Status is: Inpatient Remains inpatient appropriate because: Cavitary lung lesion   Level of care: Progressive  Consultants:  Pulmonary Interventional radiology  Procedures:  IR chest tube 9/12  Antimicrobials: Zosyn   Subjective: Seen and examined.  Resting in bed. Cough a bit improved  Objective: Vitals:   03/08/23 1220 03/08/23 1230 03/08/23 1245 03/08/23 1317  BP: 91/68 (!) 92/55 (!) 92/54 (!) 81/50  Pulse: 87 85 88 96  Resp: 18 19 20 18   Temp:    99.1 F (37.3 C)  TempSrc:      SpO2: 100% 100% 100% 100%  Weight:      Height:        Intake/Output Summary (Last 24 hours) at 03/08/2023 1357 Last data filed at 03/08/2023 0300 Gross per 24 hour  Intake 1048.74 ml  Output 500 ml  Net 548.74 ml   Filed Weights   03/05/23 2255 03/08/23 1123  Weight: 46.3 kg 46.3 kg  Examination:  General exam: NAD.  Appears frail and chronically ill Respiratory system: Scattered crackles bilaterally.  Normal work of breathing.  Room air Cardiovascular system: S1-S2, RRR, no murmurs, no pedal edema Gastrointestinal system: Thin, soft, NT/ND, normal bowel sounds Central nervous system: Alert and oriented.  No focal neurological deficits. Extremities: Symmetric 5 x 5 power. Skin: No rashes, lesions or ulcers. Left chest tube in place Psychiatry: Judgement and insight appear normal. Mood & affect appropriate.     Data Reviewed: I have personally reviewed following labs and imaging studies  CBC: Recent Labs  Lab 03/05/23 1403 03/06/23 0813 03/07/23 0516  WBC 16.4* 10.9* 9.7  NEUTROABS  --   --  7.1  HGB 9.2* 9.3* 9.0*  HCT 28.3* 28.4* 28.2*  MCV 88.7 87.1 89.5  PLT 433* 355 326   Basic Metabolic Panel: Recent Labs  Lab 03/05/23 1403 03/06/23 0810 03/07/23 0516 03/08/23 0433  NA 124* 127* 131* 134*  K 3.9 3.6 3.9 4.0  CL 92* 97* 101 101  CO2 22 23 25 24   GLUCOSE 93 98 87 100*  BUN 5* 6 <5* 6  CREATININE 0.38* 0.36* 0.42* 0.53*  CALCIUM 8.7* 8.2* 8.1* 8.2*   GFR: Estimated Creatinine Clearance: 73.1 mL/min (A) (by C-G formula based on SCr of 0.53 mg/dL (L)). Liver Function Tests: Recent Labs  Lab 03/05/23 1403  AST 24  ALT 22  ALKPHOS 131*  BILITOT 0.4  PROT 6.8  ALBUMIN 2.3*   Recent Labs  Lab 03/05/23 1403  LIPASE 22   No results for input(s): "AMMONIA" in the last 168 hours. Coagulation Profile: Recent Labs  Lab 03/05/23 1403 03/07/23 0516  INR 1.2 1.3*   Cardiac Enzymes: No results for input(s): "CKTOTAL", "CKMB", "CKMBINDEX", "TROPONINI" in the last 168 hours. BNP (last 3 results) No results for input(s): "PROBNP" in the last 8760 hours. HbA1C: No results for input(s): "HGBA1C" in the last 72 hours. CBG: No results for input(s): "GLUCAP" in the last 168 hours. Lipid Profile: No results for input(s): "CHOL", "HDL", "LDLCALC", "TRIG", "CHOLHDL", "LDLDIRECT" in the last 72 hours. Thyroid Function Tests: No results for input(s): "TSH", "T4TOTAL", "FREET4", "T3FREE", "THYROIDAB" in the last 72 hours. Anemia Panel: Recent Labs    03/05/23 1928 03/06/23 1136  VITAMINB12  --  371  FOLATE  --  >40.0  FERRITIN 501*  --   TIBC 158*  --   IRON  19*  --   RETICCTPCT 1.9  --    Sepsis Labs: Recent Labs  Lab 03/05/23 1403 03/07/23 0516 03/08/23 0433  PROCALCITON  --  0.11 0.12  LATICACIDVEN 1.0  --   --     Recent Results (from the past 240 hour(s))  Blood Culture (routine x 2)     Status: None (Preliminary result)   Collection Time: 03/05/23  2:40 PM   Specimen: BLOOD  Result Value Ref Range Status   Specimen Description BLOOD BLOOD RIGHT ARM  Final   Special Requests   Final    BOTTLES DRAWN AEROBIC AND ANAEROBIC Blood Culture adequate volume   Culture   Final    NO GROWTH 3 DAYS Performed at [facility], [address] [place], [place] [address]    Report Status PENDING  Incomplete  Blood Culture (routine x 2)     Status: None (Preliminary result)   Collection Time: 03/05/23  2:45 PM   Specimen: BLOOD  Result Value Ref Range Status   Specimen Description BLOOD LEFT ANTECUBITAL  Final  Special Requests   Final    BOTTLES DRAWN AEROBIC AND ANAEROBIC Blood Culture adequate volume   Culture   Final    NO GROWTH 3 DAYS Performed at [facility], [address] [place], [place] [address]    Report Status PENDING  Incomplete  MRSA Next Gen by PCR, Nasal     Status: None   Collection Time: 03/06/23  8:09 AM   Specimen: Nasal Mucosa; Nasal Swab  Result Value Ref Range Status   MRSA by PCR Next Gen NOT DETECTED NOT DETECTED Final    Comment: (NOTE) The GeneXpert MRSA Assay (FDA approved for NASAL specimens only), is one component of a comprehensive MRSA colonization surveillance program. It is not intended to diagnose MRSA infection nor to guide or monitor treatment for MRSA infections. Test performance is not FDA approved in patients less than 10 years old. Performed at [facility], [address] [place], [place] [address]   Expectorated Sputum Assessment w Gram Stain, Rflx to Resp Cult     Status: None   Collection Time: 03/06/23  1:50 PM   Specimen: Sputum  Result Value  Ref Range Status   Specimen Description SPUTUM  Final   Special Requests NONE  Final   Sputum evaluation   Final    THIS SPECIMEN IS ACCEPTABLE FOR SPUTUM CULTURE Performed at [facility], [address] [place], [place] [address]    Report Status 03/06/2023 FINAL  Final  Culture, Respiratory w Gram Stain     Status: None (Preliminary result)   Collection Time: 03/06/23  1:50 PM   Specimen: SPU  Result Value Ref Range Status   Specimen Description   Final    SPUTUM Performed at [facility], [address] [place], [place] [address]    Special Requests   Final    NONE Reflexed from [id number] Performed at [facility], [address] [place], [place] [address]    Gram Stain   Final    MODERATE WBC PRESENT,BOTH PMN AND MONONUCLEAR RARE GRAM NEGATIVE RODS    Culture   Final    Normal respiratory flora-no Staph aureus or Pseudomonas seen Performed at [facility], [address] [place], [place] [address]    Report Status PENDING  Incomplete  Culture, Respiratory w Gram Stain     Status: None (Preliminary result)   Collection Time: 03/06/23  6:30 PM   Specimen: Sputum; Respiratory  Result Value Ref Range Status   Specimen Description   Final    SPU Performed at [facility], [address] [place], [place] [address]    Special Requests   Final    NONE Performed at [facility], [address] [place], [place] [address]    Gram Stain   Final    ABUNDANT WBC PRESENT, PREDOMINANTLY PMN NO ORGANISMS SEEN    Culture   Final    NO GROWTH < 24 HOURS Performed at [facility], [address], [place], [place] [address]    Report Status PENDING  Incomplete         Radiology Studies: No results found.      Scheduled Meds:  benzonatate  200 mg Oral BID   enoxaparin (LOVENOX) injection  40 mg Subcutaneous Q24H   feeding supplement  237 mL Oral TID BM   fentaNYL       ferrous sulfate  325 mg Oral BID  WC   folic acid  1  mg Oral Daily   gabapentin  300 mg Oral TID   guaiFENesin  1,200 mg Oral BID   lidocaine (PF)  10 mL Intradermal Once   midazolam       multivitamin with minerals  1 tablet Oral Daily   nicotine  21 mg Transdermal Daily   rifaximin  550 mg Oral BID   spiritus frumenti  1 each Oral TID   thiamine  100 mg Oral Daily   Or   thiamine  100 mg Intravenous Daily   Continuous Infusions:  sodium chloride Stopped (03/07/23 1818)   piperacillin-tazobactam (ZOSYN)  IV 3.375 g (03/08/23 0627)   sodium chloride       LOS: 3 days     Silvano Bilis, MD [facility]   If 7PM-7AM, please contact night-coverage  03/08/2023, 1:57 PM

## 2023-03-09 ENCOUNTER — Inpatient Hospital Stay: Payer: Medicare Other

## 2023-03-09 DIAGNOSIS — J189 Pneumonia, unspecified organism: Secondary | ICD-10-CM | POA: Diagnosis not present

## 2023-03-09 DIAGNOSIS — J984 Other disorders of lung: Secondary | ICD-10-CM | POA: Diagnosis not present

## 2023-03-09 LAB — COMPREHENSIVE METABOLIC PANEL
ALT: 19 U/L (ref 0–44)
AST: 27 U/L (ref 15–41)
Albumin: 1.8 g/dL — ABNORMAL LOW (ref 3.5–5.0)
Alkaline Phosphatase: 106 U/L (ref 38–126)
Anion gap: 8 (ref 5–15)
BUN: 8 mg/dL (ref 6–20)
CO2: 24 mmol/L (ref 22–32)
Calcium: 8 mg/dL — ABNORMAL LOW (ref 8.9–10.3)
Chloride: 100 mmol/L (ref 98–111)
Creatinine, Ser: 0.49 mg/dL — ABNORMAL LOW (ref 0.61–1.24)
GFR, Estimated: >60 mL/min (ref >60)
Glucose, Bld: 143 mg/dL — ABNORMAL HIGH (ref 70–99)
Potassium: 4.1 mmol/L (ref 3.5–5.1)
Sodium: 132 mmol/L — ABNORMAL LOW (ref 135–145)
Total Bilirubin: 0.5 mg/dL (ref 0.3–1.2)
Total Protein: 5.3 g/dL — ABNORMAL LOW (ref 6.5–8.1)

## 2023-03-09 LAB — CBC
HCT: 25 % — ABNORMAL LOW (ref 39.0–52.0)
Hemoglobin: 8 g/dL — ABNORMAL LOW (ref 13.0–17.0)
MCH: 28.9 pg (ref 26.0–34.0)
MCHC: 32 g/dL (ref 30.0–36.0)
MCV: 90.3 fL (ref 80.0–100.0)
Platelets: 284 10*3/uL (ref 150–400)
RBC: 2.77 MIL/uL — ABNORMAL LOW (ref 4.22–5.81)
RDW: 15.7 % — ABNORMAL HIGH (ref 11.5–15.5)
WBC: 7 10*3/uL (ref 4.0–10.5)
nRBC: 0 % (ref 0.0–0.2)

## 2023-03-09 LAB — PROTIME-INR
INR: 1.1 (ref 0.8–1.2)
Prothrombin Time: 14.4 s (ref 11.4–15.2)

## 2023-03-09 LAB — CULTURE, RESPIRATORY W GRAM STAIN: Culture: NORMAL

## 2023-03-09 LAB — HCV INTERPRETATION

## 2023-03-09 LAB — TYPE AND SCREEN
ABO/RH(D): A POS
Antibody Screen: NEGATIVE

## 2023-03-09 LAB — C-REACTIVE PROTEIN: CRP: 6.9 mg/dL — ABNORMAL HIGH (ref <1.0)

## 2023-03-09 LAB — QUANTIFERON-TB GOLD PLUS: QuantiFERON-TB Gold Plus: NEGATIVE

## 2023-03-09 LAB — ACID FAST SMEAR (AFB, MYCOBACTERIA): Acid Fast Smear: NEGATIVE

## 2023-03-09 LAB — HISTOPLASMA ANTIGEN, URINE: Histoplasma Antigen, urine: NEGATIVE (ref <0.2)

## 2023-03-09 LAB — LACTIC ACID, PLASMA: Lactic Acid, Venous: 2.4 mmol/L (ref 0.5–1.9)

## 2023-03-09 LAB — HCV AB W REFLEX TO QUANT PCR: HCV Ab: NONREACTIVE

## 2023-03-09 LAB — QUANTIFERON-TB GOLD PLUS (RQFGPL)
QuantiFERON Mitogen Value: 3.18 [IU]/mL
QuantiFERON Nil Value: 0.03 [IU]/mL
QuantiFERON TB1 Ag Value: 0.02 [IU]/mL
QuantiFERON TB2 Ag Value: 0.03 [IU]/mL

## 2023-03-09 LAB — PROCALCITONIN: Procalcitonin: 0.1 ng/mL

## 2023-03-09 MED ORDER — OXYCODONE HCL 5 MG PO TABS
5.0000 mg | ORAL_TABLET | Freq: Four times a day (QID) | ORAL | Status: DC | PRN
  Administered 2023-03-09 – 2023-03-13 (×18): 5 mg via ORAL
  Filled 2023-03-09 (×18): qty 1

## 2023-03-09 MED ORDER — POLYETHYLENE GLYCOL 3350 17 G PO PACK
34.0000 g | PACK | Freq: Every day | ORAL | Status: DC
  Administered 2023-03-09 – 2023-03-14 (×3): 34 g via ORAL
  Filled 2023-03-09 (×4): qty 2

## 2023-03-09 MED ORDER — SODIUM CHLORIDE 0.9 % IV SOLN: INTRAVENOUS | Status: DC

## 2023-03-09 MED ORDER — AMOXICILLIN-POT CLAVULANATE 875-125 MG PO TABS
1.0000 | ORAL_TABLET | Freq: Two times a day (BID) | ORAL | Status: AC
  Administered 2023-03-09 – 2023-03-13 (×10): 1 via ORAL
  Filled 2023-03-09 (×10): qty 1

## 2023-03-09 NOTE — Progress Notes (Signed)
PULMONOLOGY         Date: 03/09/2023,   MRN# [id number] [facility] [birth year]     AdmissionWeight: 46.3 kg                 CurrentWeight: 46.3 kg  Referring provider: Dr Mikey College   CHIEF COMPLAINT:   Hydropneumothorax   HISTORY OF PRESENT ILLNESS   Patient reports exposure to black mold "very heavy" appx 1-2 years ago.  He has lived in this house with mold for appx 3 years.  He smokes actively, he smokes tobacco and THC.  He denies any other substances but admits to drinking alcohol.  He drinks 40Oz. Of beer daily.    03/07/23- patient is able to eat and is breathing on room air. Reports cough is worse. He has IR consult for chest tube but shares procedure was postponed.  He is on antibiotics with zosyn and vanco.  MRSA is negative. Sputum culture is negative and procalcitonin is flat both indicative of possible cancer as main etiology.   03/08/23- patient for chest tube today. CRP remains elevated and he may still require throacic surgery evaluation   PAST MEDICAL HISTORY   Past Medical History:  Diagnosis Date   Allergy    Arthritis    Depression    Elevated liver enzymes 10/29/2017   GERD (gastroesophageal reflux disease)    Hearing loss, conductive, bilateral 12/31/2017   Hypertension    Polio    Seizures (HCC)    last seizure about 7 years ago.    Substance abuse (HCC)    Alcohol abuse     SURGICAL HISTORY   Past Surgical History:  Procedure Laterality Date   COLONOSCOPY N/A 06/15/2022   Procedure: COLONOSCOPY;  Surgeon: Wyline Mood, MD;  Location: [facility] ENDOSCOPY;  Service: Gastroenterology;  Laterality: N/A;   COLONOSCOPY WITH PROPOFOL N/A 07/13/2021   Procedure: COLONOSCOPY WITH PROPOFOL;  Surgeon: Wyline Mood, MD;  Location: [facility] ENDOSCOPY;  Service: Gastroenterology;  Laterality: N/A;   ESOPHAGOGASTRODUODENOSCOPY N/A 07/13/2021   Procedure: ESOPHAGOGASTRODUODENOSCOPY (EGD);  Surgeon: Wyline Mood, MD;  Location: [facility] ENDOSCOPY;  Service:  Gastroenterology;  Laterality: N/A;   ESOPHAGOGASTRODUODENOSCOPY (EGD) WITH PROPOFOL N/A 10/13/2021   Procedure: ESOPHAGOGASTRODUODENOSCOPY (EGD) WITH PROPOFOL;  Surgeon: Wyline Mood, MD;  Location: [facility] ENDOSCOPY;  Service: Gastroenterology;  Laterality: N/A;   ESOPHAGOGASTRODUODENOSCOPY (EGD) WITH PROPOFOL N/A 11/08/2021   Procedure: ESOPHAGOGASTRODUODENOSCOPY (EGD) WITH PROPOFOL;  Surgeon: Wyline Mood, MD;  Location: [facility] ENDOSCOPY;  Service: Gastroenterology;  Laterality: N/A;   ESOPHAGOGASTRODUODENOSCOPY (EGD) WITH PROPOFOL N/A 06/15/2022   Procedure: ESOPHAGOGASTRODUODENOSCOPY (EGD) WITH PROPOFOL;  Surgeon: Wyline Mood, MD;  Location: [facility] ENDOSCOPY;  Service: Gastroenterology;  Laterality: N/A;   LEG SURGERY  1988   8 surgeries-hips and legs for walking after getting polio- last one 1988   WRIST SURGERY     right     FAMILY HISTORY   Family History  Problem Relation Age of Onset   Cancer Mother        Breast   Arthritis Mother    Alcohol abuse Father    Arthritis Father    Hypertension Father    Cancer Maternal Grandmother        Breast   Hypertension Maternal Grandmother    Hypertension Maternal Grandfather    Hypertension Paternal Grandmother    Hypertension Paternal Grandfather    Drug abuse Maternal Uncle      SOCIAL HISTORY   Social History   Tobacco Use   Smoking status:  Every Day    Current packs/day: 2.00    Average packs/day: 2.0 packs/day for 25.0 years (50.0 ttl pk-yrs)    Types: Cigarettes   Smokeless tobacco: Never  Vaping Use   Vaping status: Never Used  Substance Use Topics   Alcohol use: Yes    Alcohol/week: 70.0 standard drinks of alcohol    Types: 70 Cans of beer per week    Comment: 2 days ago   Drug use: No     MEDICATIONS    Home Medication:    Current Medication:  Current Facility-Administered Medications:    0.9 %  sodium chloride infusion, , Intravenous, PRN, Georgeann Oppenheim, Sudheer B, MD, Stopped at 03/07/23 1818   0.9 %  sodium  chloride infusion, , Intravenous, Continuous, Wouk, Wilfred Curtis, MD, Last Rate: 100 mL/hr at 03/09/23 0903, Rate Change at 03/09/23 0903   amoxicillin-clavulanate (AUGMENTIN) 875-125 MG per tablet 1 tablet, 1 tablet, Oral, Q12H, Rilley Poulter, MD   benzonatate (TESSALON) capsule 200 mg, 200 mg, Oral, BID, Mikey College T, MD, 200 mg at 03/08/23 2149   cyclobenzaprine (FLEXERIL) tablet 7.5 mg, 7.5 mg, Oral, TID PRN, Mikey College T, MD   enoxaparin (LOVENOX) injection 40 mg, 40 mg, Subcutaneous, Q24H, Zhang, Ping T, MD, 40 mg at 03/09/23 0900   feeding supplement (ENSURE ENLIVE / ENSURE PLUS) liquid 237 mL, 237 mL, Oral, TID BM, Sreenath, Sudheer B, MD, 237 mL at 03/07/23 1314   ferrous sulfate tablet 325 mg, 325 mg, Oral, BID WC, Sreenath, Sudheer B, MD, 325 mg at 03/09/23 0900   folic acid (FOLVITE) tablet 1 mg, 1 mg, Oral, Daily, Chipper Herb, Ping T, MD, 1 mg at 03/09/23 0900   gabapentin (NEURONTIN) capsule 300 mg, 300 mg, Oral, TID, Chipper Herb, Ping T, MD, 300 mg at 03/09/23 0900   guaiFENesin (MUCINEX) 12 hr tablet 1,200 mg, 1,200 mg, Oral, BID, Chipper Herb, Ping T, MD, 1,200 mg at 03/09/23 0900   hydrOXYzine (ATARAX) tablet 25 mg, 25 mg, Oral, Q8H PRN, Mikey College T, MD, 25 mg at 03/07/23 2213   ketorolac (TORADOL) 15 MG/ML injection 15 mg, 15 mg, Intravenous, Q8H PRN, Wouk, Wilfred Curtis, MD, 15 mg at 03/09/23 0604   lidocaine (PF) (XYLOCAINE) 1 % injection 10 mL, 10 mL, Intradermal, Once, Mugweru, Jon, MD   multivitamin with minerals tablet 1 tablet, 1 tablet, Oral, Daily, Mikey College T, MD, 1 tablet at 03/09/23 0859   nicotine (NICODERM CQ - dosed in mg/24 hours) patch 21 mg, 21 mg, Transdermal, Daily, Mikey College T, MD, 21 mg at 03/09/23 1610   oxyCODONE (Oxy IR/ROXICODONE) immediate release tablet 5 mg, 5 mg, Oral, Q6H PRN, Manuela Schwartz, NP, 5 mg at 03/09/23 0909   polyethylene glycol (MIRALAX / GLYCOLAX) packet 34 g, 34 g, Oral, Daily, Wouk, Wilfred Curtis, MD, 34 g at 03/09/23 0904   rifaximin (XIFAXAN)  tablet 550 mg, 550 mg, Oral, BID, Mikey College T, MD, 550 mg at 03/09/23 9604   spiritus frumenti (ethyl alcohol) solution 1 each, 1 each, Oral, TID, Lolita Patella B, MD, 1 each at 03/08/23 2206   thiamine (VITAMIN B1) tablet 100 mg, 100 mg, Oral, Daily, 100 mg at 03/09/23 0859 **OR** thiamine (VITAMIN B1) injection 100 mg, 100 mg, Intravenous, Daily, Mikey College T, MD   traZODone (DESYREL) tablet 200 mg, 200 mg, Oral, QHS PRN, Mikey College T, MD, 200 mg at 03/07/23 2213    ALLERGIES   Patient has no known allergies.     REVIEW OF SYSTEMS  Review of Systems:  Gen:  Denies  fever, sweats, chills weigh loss  HEENT: Denies blurred vision, double vision, ear pain, eye pain, hearing loss, nose bleeds, sore throat Cardiac:  No dizziness, chest pain or heaviness, chest tightness,edema Resp:   reports dyspnea chronically  Gi: Denies swallowing difficulty, stomach pain, nausea or vomiting, diarrhea, constipation, bowel incontinence Gu:  Denies bladder incontinence, burning urine Ext:   Denies Joint pain, stiffness or swelling Skin: Denies  skin rash, easy bruising or bleeding or hives Endoc:  Denies polyuria, polydipsia , polyphagia or weight change Psych:   Denies depression, insomnia or hallucinations   Other:  All other systems negative   VS: BP 107/72 (BP Location: Left Arm)   Pulse 62   Temp 97.7 F (36.5 C)   Resp 16   Ht 5\' 1"  (1.549 m)   Wt 46.3 kg   SpO2 99%   BMI 19.29 kg/m      PHYSICAL EXAM    GENERAL:NAD, no fevers, chills, no weakness no fatigue HEAD: Normocephalic, atraumatic.  EYES: Pupils equal, round, reactive to light. Extraocular muscles intact. No scleral icterus.  MOUTH: Moist mucosal membrane. Dentition intact. No abscess noted.  EAR, NOSE, THROAT: Clear without exudates. No external lesions.  NECK: Supple. No thyromegaly. No nodules. No JVD.  PULMONARY: decreased breath sounds with mild rhonchi worse at bases bilaterally.  CARDIOVASCULAR: S1  and S2. Regular rate and rhythm. No murmurs, rubs, or gallops. No edema. Pedal pulses 2+ bilaterally.  GASTROINTESTINAL: Soft, nontender, nondistended. No masses. Positive bowel sounds. No hepatosplenomegaly.  MUSCULOSKELETAL: No swelling, clubbing, or edema. Range of motion full in all extremities.  NEUROLOGIC: Cranial nerves II through XII are intact. No gross focal neurological deficits. Sensation intact. Reflexes intact.  SKIN: No ulceration, lesions, rashes, or cyanosis. Skin warm and dry. Turgor intact.  PSYCHIATRIC: Mood, affect within normal limits. The patient is awake, alert and oriented x 3. Insight, judgment intact.       IMAGING       Impression  CLINICAL DATA:  Abnormal chest x-ray.  Lung mass.  Weight loss.   EXAM: CT CHEST WITH CONTRAST   TECHNIQUE: Multidetector CT imaging of the chest was performed during intravenous contrast administration.   RADIATION DOSE REDUCTION: This exam was performed according to the departmental dose-optimization program which includes automated exposure control, adjustment of the mA and/or kV according to patient size and/or use of iterative reconstruction technique.   CONTRAST:  75mL OMNIPAQUE IOHEXOL 300 MG/ML  SOLN   COMPARISON:  X-ray earlier 03/05/2023.  CT scan September 2015   FINDINGS: Cardiovascular: Heart is nonenlarged. No pericardial effusion. Normal caliber thoracic aorta.   Mediastinum/Nodes: No specific abnormal lymph node enlargement identified in the axillary regions. No abnormal nodes in the right hilum. There are some small left hilar and mediastinal nodes. Example left infrahilar region measures 15 x 7 mm. Precarinal node measures 8 mm in short axis on series 2, image 64. Normal caliber thoracic esophagus. Preserved thyroid gland.   Lungs/Pleura: Diffuse breathing motion. Right lung is grossly clear without consolidation, pneumothorax or effusion. There is a 3 mm right lower lobe lung nodule on series 2,  image 91. Not clearly seen previously in 2015.   However the left lung has a area of consolidation left lower lobe with some air bronchograms, associated pleural thickening and a loculated pleural effusion with some air, loculated hydropneumothorax. Overall this measures a proximally 9.3 x 2.6 cm in the axial plane and extends cephalocaudal of up  to 19 cm proximally. This extends up to a cavitary lesion in the posterior left upper lobe abutting the pleura with thick irregular walls measuring 5.9 x 4.0 cm. Along the superior aspect of the consolidation left lower lobe is a nodular component extending into the superior segment and slightly crossing the interlobar fissure. This has a nodular component with spiculations on series 2, image 80 measures 2.0 by 1.7 cm.   There are some small nodules elsewhere in the left lung such as series 2, image 60 measuring 5 mm. Other foci on image 66.   Upper Abdomen: Adrenal glands are preserved in the upper abdomen. Stone in the nondilated gallbladder.   Musculoskeletal: Curvature of the thoracic spine with some mild degenerative changes.   IMPRESSION: 5.9 cm cavitary mass in the left upper lobe abutting the pleura with irregular and thickened margins. Associated loculated left hydropneumothorax extending down to the diaphragm with consolidative opacity in the left lower lobe.   Additional nodular component seen adjacent to the consolidation in the superior segment left lower lobe which may cross the pleura.   Overall differential would include atypical infection such as TB versus neoplasm amongst other differential. Recommend additional workup.   Few separate tiny lung nodules elsewhere and some prominent lymph nodes in the mediastinum and left hilum. Attention on short follow-up.   Gallstones.     Electronically Signed   By: Karen Kays M.D.   On: 03/05/2023 16:54    ASSESSMENT/PLAN   Left hydropneumothorax - patient does have  some risk factors for TB including alcoholism and sharing a home with many people who come and go.   -He has smoked his entire life and also has malignancy risk  -he has never had cancer but does have family history including mother with breast cancer and grandmother with breast cancer and uncle with some kind of cancer. - patient would benefit from IR consultation and possible pleural drain.  He may need thoracic surgery evaluation.  -He has lost appx 40 lbs and is currently less then 100lbs with BMI <20.  This may be due to alcoholism vs cancer -He has hyponatremia and reports disequlibrium. 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  -currently he remain in empiric on Zosyn and Vancomycin IV with phramacy consultation -serum fungitell -legionella ab -strep pneumoniae ur AG -Histoplasma Ur Ag -sputum resp cultures -AFB sputum expectorated specimen -sputum cytology  -reviewed pertinent imaging with patient today - ESR/CRP/procalcitonin/MRSA PCR -please encourage patient to use incentive spirometer few times each hour while hospitalized.              Thank you for allowing me to participate in the care of this patient.   Patient/Family are satisfied with care plan and all questions have been answered.    Provider disclosure: Patient with at least one acute or chronic illness or injury that poses a threat to life or bodily function and is being managed actively during this encounter.  All of the below services have been performed independently by signing provider:  review of prior documentation from internal and or external health records.  Review of previous and current lab results.  Interview and comprehensive assessment during patient visit today. Review of current and previous chest radiographs/CT scans. Discussion of management and test interpretation with health care team and patient/family.   This document was prepared  using Dragon voice recognition software and may include unintentional dictation errors.  Vida Rigger, M.D.  Division of Pulmonary & Critical Care Medicine

## 2023-03-09 NOTE — Progress Notes (Signed)
Nutrition Follow-up  DOCUMENTATION CODES:   Severe malnutrition in context of chronic illness  INTERVENTION:   Discontinue Ensure   Double portions with meal trays   Magic cup TID with meals, each supplement provides 290 kcal and 9 grams of protein  Snacks  MVI, folic acid and thiamine po daily   Pt at high refeed risk; recommend monitor potassium, magnesium and phosphorus labs daily until stable  Daily weights   NUTRITION DIAGNOSIS:   Severe Malnutrition related to chronic illness (cirrhosis) as evidenced by severe fat depletion, severe muscle depletion. -ongoing   GOAL:   Patient will meet greater than or equal to 90% of their needs -progressing   MONITOR:   PO intake, Supplement acceptance, Weight trends, Skin, I & O's, Labs  ASSESSMENT:   47 y/o male with h/o etoh abuse, polio, anxiety, MDD, chronic pain, seizures, cirrhosis and GERD who is admitted with cavitary PNA, bronchopleural fistula and hydropneumothorax s/p chest tube placement 9/12.  Met with pt in room today. Pt reports good appetite and oral intake in hospital; pt reports eating 100% of meals. Pt reports that he does not like the Ensure supplements and reports that he has not been drinking them. Pt declines to try another ONS. Pt reports that he gets hungry in between meals and at bedtime. RD discussed with pt the importance of adequate nutrition needed to preserve lean muscle. Pt would like to have double portions with meals and a bedtime snack; RD will order. Pt is at high refeed risk; will check electrolytes tomorrow. Per chart, pt is weight stable since admission.   Medications reviewed and include: augmentin, lovenox, ferrous sulfate, folic acid, MVI, nicotine, miralax, xifaxin, thiamine   Labs reviewed: Na 132(L), K 4.1 wnl, creat 0.49(L) Hgb 8.0(L), Hct 25.0(L)  Chest tube-   Diet Order:   Diet Order             Diet regular Room service appropriate? Yes; Fluid consistency: Thin  Diet  effective now                  EDUCATION NEEDS:   Education needs have been addressed  Skin:  Skin Assessment: Reviewed RN Assessment  Last BM:  9/9  Height:   Ht Readings from Last 1 Encounters:  03/08/23 5\' 1"  (1.549 m)    Weight:   Wt Readings from Last 1 Encounters:  03/08/23 46.3 kg    Ideal Body Weight:  50.9 kg  BMI:  Body mass index is 19.29 kg/m.  Estimated Nutritional Needs:   Kcal:  1700-2000kcal/day  Protein:  85-100g/day  Fluid:  1.4-1.6L/day  Betsey Holiday MS, RD, LDN Please refer to [facility] for RD and/or RD on-call/weekend/after hours pager

## 2023-03-09 NOTE — Progress Notes (Addendum)
PROGRESS NOTE    Keith Parker  ZOX:[id number] DOB: June 07, 1976 DOA: 03/05/2023 PCP: Leanna Sato, MD    Brief Narrative:  47 y.o. male with medical history significant of alcoholic liver cirrhosis, polio with chronic right leg pain, anxiety/depression, presented with worsening of productive cough, loss of appetite, malaise and weight loss.   Symptoms started 6 months ago, patient started develop productive cough with thick brownish sputum, night> day, associated with loss of appetite and malaise.  He does not see any blood streaks in the sputum.  Denies any fever chills no night sweat.  He lost about 25 pounds from 120 to 95 pounds in 6 months.  Yesterday he went to see PCP who recommended patient come to ED for evaluation.  9/10: Seen in consultation by pulmonary.  Interventional radiology consultation requested.  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      Assessment & Plan:   Principal Problem:   Cavitary pneumonia Active Problems:   Alcoholic cirrhosis of liver with ascites (HCC)   Weight loss   Lobar pneumonia (HCC)   Hydropneumothorax   Protein-calorie malnutrition, severe   Left lung cavity pneumonia Bronchopleural fistula Hydropneumothorax Etiology unclear.  Infection and malignancy both on differential.  TB remains on differential given background of alcoholism and living in halfway house/group home.  Pulmonary following.  Interventional radiology engaged. Plan: Continue IV Zosyn (started 9/10) Sputum for AFB every 8 hours x 3, first is neg Airborne isolation Histoplasma pending Legionella neg Strep pneumo urinary antigen neg Sputum respiratory culture normal growth Stress incentive spirometry use IR chest tube placed 9/12, culture ngtd  Hypotension Yesterday worse after procedure other hemodynamics stable and patient is asymptomatic. Has improved with fluids but lactate remains elevated as does bnp - will update TTE - will start  maintenance fluids  Hyponatremia Appears euvolemic.  Possible SIADH secondary to lung lesion Sodium and osms are low, some degree of dehydration probably contributing as sodium has responded to fluids, now stable around 134   Anemia, normocytic Iron deficiency anemia Iron indices low, received IV iron. Hgb has dropped to around 8 but there is no bleeding. B12/folate wnl. No signs hemolysis. Chronic disease likely contributing Plan: Monitor for now  Constipation Reports last BM 9/9 - will increase miralax dose   Liver cirrhosis Holding diuresis given hypotension -Continue rifaximin   Alcohol abuse Patient drinks approximately 40 ounces alcohol per day.  Not interested in quitting at this time. No s/s withdrawal. Hiv/hcv/rpr neg Plan: Will continue beer in the hospital  Tobacco abuse Nicotine patch  DVT prophylaxis: SQ Lovenox Code Status: Full Family Communication: None Disposition Plan: Status is: Inpatient Remains inpatient appropriate because: Cavitary lung lesion   Level of care: Progressive  Consultants:  Pulmonary Interventional radiology  Procedures:  IR chest tube 9/12  Antimicrobials: Ceftriaxone/azithromycin 9/9 Zosyn 9/10>   Subjective: Seen and examined.  Resting in bed. Some pain w/ coughing  Objective: Vitals:   03/08/23 2153 03/08/23 2342 03/09/23 0530 03/09/23 0838  BP: 95/72 99/69 122/84 107/72  Pulse: 66 80 64 62  Resp: 17 18 20 16   Temp: 98.5 F (36.9 C) 98.2 F (36.8 C) 97.7 F (36.5 C) 97.7 F (36.5 C)  TempSrc: Axillary     SpO2: 100% 99% 100% 99%  Weight:      Height:        Intake/Output Summary (Last 24 hours) at 03/09/2023 0850 Last data filed at 03/09/2023 0530 Gross per 24 hour  Intake  168.13 ml  Output 1429 ml  Net -1260.87 ml   Filed Weights   03/05/23 2255 03/08/23 1123  Weight: 46.3 kg 46.3 kg    Examination:  General exam: NAD.  Appears frail and chronically ill Respiratory system: Scattered crackles  bilaterally L>R. Normal work of breathing.  Left chest tube Cardiovascular system: S1-S2, RRR, no murmurs, no pedal edema Gastrointestinal system: Thin, soft, NT/ND, normal bowel sounds Central nervous system: Alert and oriented. No focal neurological deficits. Extremities: Symmetric 5 x 5 power. Skin: No rashes, lesions or ulcers.   Psychiatry: Judgement and insight appear normal. Mood & affect appropriate.     Data Reviewed: I have personally reviewed following labs and imaging studies  CBC: Recent Labs  Lab 03/05/23 1403 03/06/23 0813 03/07/23 0516 03/08/23 1421 03/08/23 2321 03/09/23 0649  WBC 16.4* 10.9* 9.7 9.6  --  7.0  NEUTROABS  --   --  7.1  --   --   --   HGB 9.2* 9.3* 9.0* 7.3* 7.6* 8.0*  HCT 28.3* 28.4* 28.2* 23.1* 24.0* 25.0*  MCV 88.7 87.1 89.5 90.9  --  90.3  PLT 433* 355 326 301  --  284   Basic Metabolic Panel: Recent Labs  Lab 03/05/23 1403 03/06/23 0810 03/07/23 0516 03/08/23 0433 03/09/23 0649  NA 124* 127* 131* 134* 132*  K 3.9 3.6 3.9 4.0 4.1  CL 92* 97* 101 101 100  CO2 22 23 25 24 24   GLUCOSE 93 98 87 100* 143*  BUN 5* 6 <5* 6 8  CREATININE 0.38* 0.36* 0.42* 0.53* 0.49*  CALCIUM 8.7* 8.2* 8.1* 8.2* 8.0*   GFR: Estimated Creatinine Clearance: 73.1 mL/min (A) (by C-G formula based on SCr of 0.49 mg/dL (L)). Liver Function Tests: Recent Labs  Lab 03/05/23 1403 03/09/23 0649  AST 24 27  ALT 22 19  ALKPHOS 131* 106  BILITOT 0.4 0.5  PROT 6.8 5.3*  ALBUMIN 2.3* 1.8*   Recent Labs  Lab 03/05/23 1403  LIPASE 22   No results for input(s): "AMMONIA" in the last 168 hours. Coagulation Profile: Recent Labs  Lab 03/05/23 1403 03/07/23 0516 03/09/23 0649  INR 1.2 1.3* 1.1   Cardiac Enzymes: No results for input(s): "CKTOTAL", "CKMB", "CKMBINDEX", "TROPONINI" in the last 168 hours. BNP (last 3 results) No results for input(s): "PROBNP" in the last 8760 hours. HbA1C: No results for input(s): "HGBA1C" in the last 72  hours. CBG: No results for input(s): "GLUCAP" in the last 168 hours. Lipid Profile: No results for input(s): "CHOL", "HDL", "LDLCALC", "TRIG", "CHOLHDL", "LDLDIRECT" in the last 72 hours. Thyroid Function Tests: No results for input(s): "TSH", "T4TOTAL", "FREET4", "T3FREE", "THYROIDAB" in the last 72 hours. Anemia Panel: Recent Labs    03/06/23 1136  VITAMINB12 371  FOLATE >40.0   Sepsis Labs: Recent Labs  Lab 03/05/23 1403 03/07/23 0516 03/08/23 0433 03/08/23 1425 03/09/23 0649  PROCALCITON  --  0.11 0.12  --  0.10  LATICACIDVEN 1.0  --   --  2.4* 2.4*    Recent Results (from the past 240 hour(s))  Blood Culture (routine x 2)     Status: None (Preliminary result)   Collection Time: 03/05/23  2:40 PM   Specimen: BLOOD  Result Value Ref Range Status   Specimen Description BLOOD BLOOD RIGHT ARM  Final   Special Requests   Final    BOTTLES DRAWN AEROBIC AND ANAEROBIC Blood Culture adequate volume   Culture   Final    NO GROWTH 4 DAYS Performed  at [facility], [address] [place], [place] [address]    Report Status PENDING  Incomplete  Blood Culture (routine x 2)     Status: None (Preliminary result)   Collection Time: 03/05/23  2:45 PM   Specimen: BLOOD  Result Value Ref Range Status   Specimen Description BLOOD LEFT ANTECUBITAL  Final   Special Requests   Final    BOTTLES DRAWN AEROBIC AND ANAEROBIC Blood Culture adequate volume   Culture   Final    NO GROWTH 4 DAYS Performed at [facility], [address], [place], [place] [address]    Report Status PENDING  Incomplete  MRSA Next Gen by PCR, Nasal     Status: None   Collection Time: 03/06/23  8:09 AM   Specimen: Nasal Mucosa; Nasal Swab  Result Value Ref Range Status   MRSA by PCR Next Gen NOT DETECTED NOT DETECTED Final    Comment: (NOTE) The GeneXpert MRSA Assay (FDA approved for NASAL specimens only), is one component of a comprehensive MRSA colonization surveillance program. It  is not intended to diagnose MRSA infection nor to guide or monitor treatment for MRSA infections. Test performance is not FDA approved in patients less than 34 years old. Performed at [facility], [address] [place], [place] [address]   Expectorated Sputum Assessment w Gram Stain, Rflx to Resp Cult     Status: None   Collection Time: 03/06/23  1:50 PM   Specimen: Sputum  Result Value Ref Range Status   Specimen Description SPUTUM  Final   Special Requests NONE  Final   Sputum evaluation   Final    THIS SPECIMEN IS ACCEPTABLE FOR SPUTUM CULTURE Performed at [facility], [address] [place], [place] [address]    Report Status 03/06/2023 FINAL  Final  Acid Fast Smear (AFB)     Status: None   Collection Time: 03/06/23  1:50 PM   Specimen: Sputum  Result Value Ref Range Status   AFB Specimen Processing Concentration  Final   Acid Fast Smear Negative  Final    Comment: (NOTE) Performed At: [facility] [address] [place], [place] [id number] Jolene Schimke MD WU:[id number]    Source (AFB) SPUTUM  Final    Comment: Performed at [facility], [address] [place], [place] [address]  Culture, Respiratory w Gram Stain     Status: None (Preliminary result)   Collection Time: 03/06/23  1:50 PM   Specimen: SPU  Result Value Ref Range Status   Specimen Description   Final    SPUTUM Performed at [facility], [address], [place], [place] [address]    Special Requests   Final    NONE Reflexed from [id number] Performed at [facility], [address] [place], [place] [address]    Gram Stain   Final    MODERATE WBC PRESENT,BOTH PMN AND MONONUCLEAR RARE GRAM NEGATIVE RODS    Culture   Final    Normal respiratory flora-no Staph aureus or Pseudomonas seen Performed at [facility], [address], [place], [place] [address]    Report Status PENDING  Incomplete  Culture, Respiratory w Gram Stain     Status:  None (Preliminary result)   Collection Time: 03/06/23  6:30 PM   Specimen: Sputum; Respiratory  Result Value Ref Range Status   Specimen Description   Final    SPU Performed at [facility], [address] [place],  [place] [address]    Special Requests   Final    NONE Performed at [facility], [address] [place], [place] [address]    Gram Stain   Final    ABUNDANT WBC PRESENT, PREDOMINANTLY PMN NO ORGANISMS SEEN    Culture   Final    NO GROWTH < 24 HOURS Performed at [facility], [address], [place], [place] [address]    Report Status PENDING  Incomplete  Aerobic/Anaerobic Culture w Gram Stain (surgical/deep wound)     Status: None (Preliminary result)   Collection Time: 03/08/23 12:58 PM   Specimen: Abscess  Result Value Ref Range Status   Specimen Description   Final    ABSCESS Performed at [facility], [address] [place], [place] [address]    Special Requests   Final    LEFT CHEST EMPYEMA Performed at [facility], [address] [place], [place] [address]    Gram Stain   Final    ABUNDANT WBC PRESENT,BOTH PMN AND MONONUCLEAR NO ORGANISMS SEEN    Culture   Final    NO GROWTH < 24 HOURS Performed at [facility], [address] [place], [place] [address]    Report Status PENDING  Incomplete         Radiology Studies: CT [facility] PLEURAL DRAIN W/INDWELL CATH W/IMG GUIDE  Result Date: 03/08/2023 INDICATION: Loculated pleural effusion.  Empyema. EXAM: CT-GUIDED LEFT NON-TUNNELED PLEURAL DRAINAGE CATHETER PLACEMENT COMPARISON:  CT chest and chest XR, 03/05/2023. MEDICATIONS: The patient is currently admitted to the hospital and receiving intravenous antibiotics. The antibiotics were administered within an appropriate time frame prior to the initiation of the procedure. ANESTHESIA/SEDATION: Moderate (conscious) sedation was employed during this procedure. A total of Versed 2 mg and Fentanyl 100 mcg was  administered intravenously. Moderate Sedation Time: 15 minutes. The patient's level of consciousness and vital signs were monitored continuously by radiology nursing throughout the procedure under my direct supervision. CONTRAST:  None COMPLICATIONS: None immediate. PROCEDURE: RADIATION DOSE REDUCTION: This exam was performed according to the departmental dose-optimization program which includes automated exposure control, adjustment of the mA and/or kV according to patient size and/or use of iterative reconstruction technique. Informed written consent was obtained from the patient and/or patient's representative after a discussion of the risks, benefits and alternatives to treatment. The patient was placed RIGHT lateral decubitus on the CT gantry and a pre procedural CT was performed re-demonstrating the known abscess/fluid collection within the LEFT chest. The procedure was planned. A timeout was performed prior to the initiation of the procedure. The LEFT chest was prepped and draped in the usual sterile fashion. The overlying soft tissues were anesthetized with 1% lidocaine with epinephrine. Appropriate trajectory was planned with the use of a 22 gauge spinal needle. An 18 gauge trocar needle was advanced into the abscess/fluid collection and a short Amplatz super stiff wire was coiled within the collection. Appropriate positioning was confirmed with a limited CT scan. The tract was serially dilated allowing placement of a 12 Fr drainage catheter. Appropriate positioning was confirmed with a limited postprocedural CT scan. 20 ml of purulent fluid was aspirated. The tube was connected to a pleura vac suction and sutured in place. A dressing was placed. The patient tolerated the procedure well without immediate post procedural complication. IMPRESSION: Successful CT guided placement of a 12 Fr LEFT chest non-tunneled pleural drainage catheter, with aspiration of 20 mL of purulent fluid. Samples were sent to the  laboratory as requested by the ordering clinical team. Roanna Banning, MD [facility] Electronically Signed   By: Roanna Banning M.D.   On: 03/08/2023 16:11        Scheduled Meds:  benzonatate  200 mg Oral BID   enoxaparin (LOVENOX) injection  40 mg Subcutaneous Q24H   feeding supplement  237 mL Oral TID BM   ferrous sulfate  325 mg Oral BID WC   folic acid  1 mg Oral Daily   gabapentin  300 mg Oral TID   guaiFENesin  1,200 mg Oral BID   lidocaine (PF)  10 mL Intradermal Once   multivitamin with minerals  1 tablet Oral Daily   nicotine  21 mg Transdermal Daily   polyethylene glycol  17 g Oral Daily   rifaximin  550 mg Oral BID   spiritus frumenti  1 each Oral TID   thiamine  100 mg Oral Daily   Or   thiamine  100 mg Intravenous Daily   Continuous Infusions:  sodium chloride Stopped (03/07/23 1818)   sodium chloride     piperacillin-tazobactam (ZOSYN)  IV 3.375 g (03/09/23 1610)     LOS: 4 days     Silvano Bilis, MD [facility]   If 7PM-7AM, please contact night-coverage  03/09/2023, 8:50 AM

## 2023-03-10 ENCOUNTER — Inpatient Hospital Stay (HOSPITAL_COMMUNITY)
Admit: 2023-03-10 | Discharge: 2023-03-10 | Disposition: A | Discharge status: Home / Self Care | Payer: Medicare Other | Attending: Obstetrics and Gynecology | Admitting: Obstetrics and Gynecology

## 2023-03-10 DIAGNOSIS — J189 Pneumonia, unspecified organism: Secondary | ICD-10-CM | POA: Diagnosis not present

## 2023-03-10 DIAGNOSIS — I9589 Other hypotension: Secondary | ICD-10-CM | POA: Diagnosis not present

## 2023-03-10 DIAGNOSIS — J984 Other disorders of lung: Secondary | ICD-10-CM | POA: Diagnosis not present

## 2023-03-10 LAB — CULTURE, RESPIRATORY W GRAM STAIN: Culture: NORMAL

## 2023-03-10 LAB — CBC
HCT: 26.4 % — ABNORMAL LOW (ref 39.0–52.0)
Hemoglobin: 8.3 g/dL — ABNORMAL LOW (ref 13.0–17.0)
MCH: 28.2 pg (ref 26.0–34.0)
MCHC: 31.4 g/dL (ref 30.0–36.0)
MCV: 89.8 fL (ref 80.0–100.0)
Platelets: 320 10*3/uL (ref 150–400)
RBC: 2.94 MIL/uL — ABNORMAL LOW (ref 4.22–5.81)
RDW: 15.8 % — ABNORMAL HIGH (ref 11.5–15.5)
WBC: 8.1 10*3/uL (ref 4.0–10.5)
nRBC: 0 % (ref 0.0–0.2)

## 2023-03-10 LAB — ECHOCARDIOGRAM COMPLETE
AR max vel: 2.09 cm2
AV Peak grad: 4.5 mmHg
Ao pk vel: 1.06 m/s
Area-P 1/2: 4.06 cm2
Height: 61 in
S' Lateral: 3 cm
Weight: 1679.02 [oz_av]

## 2023-03-10 LAB — CULTURE, BLOOD (ROUTINE X 2)
Culture: NO GROWTH
Culture: NO GROWTH
Special Requests: ADEQUATE
Special Requests: ADEQUATE

## 2023-03-10 LAB — BASIC METABOLIC PANEL
Anion gap: 7 (ref 5–15)
BUN: 9 mg/dL (ref 6–20)
CO2: 23 mmol/L (ref 22–32)
Calcium: 7.9 mg/dL — ABNORMAL LOW (ref 8.9–10.3)
Chloride: 102 mmol/L (ref 98–111)
Creatinine, Ser: 0.48 mg/dL — ABNORMAL LOW (ref 0.61–1.24)
GFR, Estimated: >60 mL/min (ref >60)
Glucose, Bld: 94 mg/dL (ref 70–99)
Potassium: 4.2 mmol/L (ref 3.5–5.1)
Sodium: 132 mmol/L — ABNORMAL LOW (ref 135–145)

## 2023-03-10 LAB — PROCALCITONIN: Procalcitonin: 0.1 ng/mL

## 2023-03-10 LAB — PHOSPHORUS: Phosphorus: 3.4 mg/dL (ref 2.5–4.6)

## 2023-03-10 LAB — MAGNESIUM: Magnesium: 1.9 mg/dL (ref 1.7–2.4)

## 2023-03-10 LAB — LACTIC ACID, PLASMA: Lactic Acid, Venous: 1.2 mmol/L (ref 0.5–1.9)

## 2023-03-10 LAB — C-REACTIVE PROTEIN: CRP: 4.2 mg/dL — ABNORMAL HIGH (ref <1.0)

## 2023-03-10 MED ORDER — HYDROXYZINE HCL 25 MG PO TABS
50.0000 mg | ORAL_TABLET | Freq: Three times a day (TID) | ORAL | Status: DC | PRN
  Administered 2023-03-10 – 2023-03-14 (×7): 50 mg via ORAL
  Filled 2023-03-10 (×7): qty 2

## 2023-03-10 MED ORDER — LACTULOSE 10 GM/15ML PO SOLN
30.0000 g | Freq: Once | ORAL | Status: DC
  Filled 2023-03-10: qty 60

## 2023-03-10 NOTE — Progress Notes (Signed)
PROGRESS NOTE    Keith Parker  XLK:[id number] DOB: 1976-03-08 DOA: 03/05/2023 PCP: Leanna Sato, MD    Brief Narrative:  47 y.o. male with medical history significant of alcoholic liver cirrhosis, polio with chronic right leg pain, anxiety/depression, presented with worsening of productive cough, loss of appetite, malaise and weight loss.   Symptoms started 6 months ago, patient started develop productive cough with thick brownish sputum, night> day, associated with loss of appetite and malaise.  He does not see any blood streaks in the sputum.  Denies any fever chills no night sweat.  He lost about 25 pounds from 120 to 95 pounds in 6 months.  Yesterday he went to see PCP who recommended patient come to ED for evaluation.  9/10: Seen in consultation by pulmonary.  Interventional radiology consultation requested.  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      Assessment & Plan:   Principal Problem:   Cavitary pneumonia Active Problems:   Alcoholic cirrhosis of liver with ascites (HCC)   Weight loss   Lobar pneumonia (HCC)   Hydropneumothorax   Protein-calorie malnutrition, severe   Left lung cavity pneumonia Bronchopleural fistula Hydropneumothorax  Infection and malignancy both on differential.  TB remains on differential given background of alcoholism and living in halfway house/group home.  Pulmonary following.  Interventional radiology engaged. Plan: IV Zosyn transitioned to augmentin by pulm Follow-up quant of urine in ED Sputum for AFB neg x2, pcr/cultures pending Airborne isolation Legionella neg Strep pneumo urinary antigen neg Histoplasma urinary antigen Sputum respiratory culture normal growth Stress incentive spirometry use IR chest tube mgmt per pulm, cultures negative to date Will discuss bronchopleural fistula w/ CT surg today  Hypotension Improved, lactate normalized today - will d/c fluids and  monitor  Hyponatremia Appears euvolemic.  Possible SIADH secondary to lung lesion Sodium and osms are low, some degree of dehydration probably contributing as sodium has responded to fluids, stable in low 130s   Anemia, normocytic Iron deficiency anemia Iron indices low. Hgb stable in the 8s Plan: Received IV Venofer 300 mg x 1   Liver cirrhosis -Blood pressure borderline low probably secondary to chronic poor nutrition and weight loss, hold off on Lasix and Aldactone -Continue rifaximin   Alcohol abuse Patient drinks approximately 40 ounces alcohol per day.  Not interested in quitting at this time. No s/s withdrawal. Hcv/hiv neg Plan: Will order patient beer in the hospital  Tobacco abuse Nicotine patch  DVT prophylaxis: SQ Lovenox Code Status: Full Family Communication: None Disposition Plan: Status is: Inpatient Remains inpatient appropriate because: Cavitary lung lesion   Level of care: Progressive  Consultants:  Pulmonary Interventional radiology  Procedures:  IR chest tube 9/12  Antimicrobials: Zosyn   Subjective: Seen and examined.  Resting in bed. Cough a bit improved. Reports good energy. Had BM earlier today  Objective: Vitals:   03/10/23 0005 03/10/23 0034 03/10/23 0600 03/10/23 0852  BP: 97/63  116/74 122/87  Pulse: 79   73  Resp:  19 19 18   Temp: 98.1 F (36.7 C)  98.1 F (36.7 C) 97.9 F (36.6 C)  TempSrc:      SpO2: 99%  100% 100%  Weight:   47.6 kg   Height:        Intake/Output Summary (Last 24 hours) at 03/10/2023 1113 Last data filed at 03/10/2023 1022 Gross per 24 hour  Intake 2531.55 ml  Output 600 ml  Net 1931.55 ml   [employer]  03/05/23 2255 03/08/23 1123 03/10/23 0600  Weight: 46.3 kg 46.3 kg 47.6 kg    Examination:  General exam: NAD.  Appears frail and chronically ill Respiratory system: Scattered crackles bilaterally.  Normal work of breathing.  Room air Cardiovascular system: S1-S2, RRR, no murmurs, no pedal  edema Gastrointestinal system: Thin, soft, NT/ND, normal bowel sounds Central nervous system: Alert and oriented. No focal neurological deficits. Extremities: Symmetric 5 x 5 power. Skin: No rashes, lesions or ulcers. Left chest tube in place Psychiatry: Judgement and insight appear normal. Mood & affect appropriate.     Data Reviewed: I have personally reviewed following labs and imaging studies  CBC: Recent Labs  Lab 03/06/23 0813 03/07/23 0516 03/08/23 1421 03/08/23 2321 03/09/23 0649 03/10/23 0738  WBC 10.9* 9.7 9.6  --  7.0 8.1  NEUTROABS  --  7.1  --   --   --   --   HGB 9.3* 9.0* 7.3* 7.6* 8.0* 8.3*  HCT 28.4* 28.2* 23.1* 24.0* 25.0* 26.4*  MCV 87.1 89.5 90.9  --  90.3 89.8  PLT 355 326 301  --  284 320   Basic Metabolic Panel: Recent Labs  Lab 03/06/23 0810 03/07/23 0516 03/08/23 0433 03/09/23 0649 03/10/23 0738 03/10/23 0749  NA 127* 131* 134* 132*  --  132*  K 3.6 3.9 4.0 4.1  --  4.2  CL 97* 101 101 100  --  102  CO2 23 25 24 24   --  23  GLUCOSE 98 87 100* 143*  --  94  BUN 6 <5* 6 8  --  9  CREATININE 0.36* 0.42* 0.53* 0.49*  --  0.48*  CALCIUM 8.2* 8.1* 8.2* 8.0*  --  7.9*  MG  --   --   --   --  1.9  --   PHOS  --   --   --   --  3.4  --    GFR: Estimated Creatinine Clearance: 75.2 mL/min (A) (by C-G formula based on SCr of 0.48 mg/dL (L)). Liver Function Tests: Recent Labs  Lab 03/05/23 1403 03/09/23 0649  AST 24 27  ALT 22 19  ALKPHOS 131* 106  BILITOT 0.4 0.5  PROT 6.8 5.3*  ALBUMIN 2.3* 1.8*   Recent Labs  Lab 03/05/23 1403  LIPASE 22   No results for input(s): "AMMONIA" in the last 168 hours. Coagulation Profile: Recent Labs  Lab 03/05/23 1403 03/07/23 0516 03/09/23 0649  INR 1.2 1.3* 1.1   Cardiac Enzymes: No results for input(s): "CKTOTAL", "CKMB", "CKMBINDEX", "TROPONINI" in the last 168 hours. BNP (last 3 results) No results for input(s): "PROBNP" in the last 8760 hours. HbA1C: No results for input(s): "HGBA1C" in  the last 72 hours. CBG: No results for input(s): "GLUCAP" in the last 168 hours. Lipid Profile: No results for input(s): "CHOL", "HDL", "LDLCALC", "TRIG", "CHOLHDL", "LDLDIRECT" in the last 72 hours. Thyroid Function Tests: No results for input(s): "TSH", "T4TOTAL", "FREET4", "T3FREE", "THYROIDAB" in the last 72 hours. Anemia Panel: No results for input(s): "VITAMINB12", "FOLATE", "FERRITIN", "TIBC", "IRON", "RETICCTPCT" in the last 72 hours.  Sepsis Labs: Recent Labs  Lab 03/05/23 1403 03/07/23 0516 03/08/23 0433 03/08/23 1425 03/09/23 0649 03/10/23 0738 03/10/23 0817  PROCALCITON  --  0.11 0.12  --  0.10 0.10  --   LATICACIDVEN 1.0  --   --  2.4* 2.4*  --  1.2    Recent Results (from the past 240 hour(s))  Blood Culture (routine x 2)  Status: None   Collection Time: 03/05/23  2:40 PM   Specimen: BLOOD  Result Value Ref Range Status   Specimen Description BLOOD BLOOD RIGHT ARM  Final   Special Requests   Final    BOTTLES DRAWN AEROBIC AND ANAEROBIC Blood Culture adequate volume   Culture   Final    NO GROWTH 5 DAYS Performed at [facility], [address] [place], [place] [address]    Report Status 03/10/2023 FINAL  Final  Blood Culture (routine x 2)     Status: None   Collection Time: 03/05/23  2:45 PM   Specimen: BLOOD  Result Value Ref Range Status   Specimen Description BLOOD LEFT ANTECUBITAL  Final   Special Requests   Final    BOTTLES DRAWN AEROBIC AND ANAEROBIC Blood Culture adequate volume   Culture   Final    NO GROWTH 5 DAYS Performed at [facility], [address] [place], [place] [address]    Report Status 03/10/2023 FINAL  Final  MRSA Next Gen by PCR, Nasal     Status: None   Collection Time: 03/06/23  8:09 AM   Specimen: Nasal Mucosa; Nasal Swab  Result Value Ref Range Status   MRSA by PCR Next Gen NOT DETECTED NOT DETECTED Final    Comment: (NOTE) The GeneXpert MRSA Assay (FDA approved for NASAL specimens only), is one  component of a comprehensive MRSA colonization surveillance program. It is not intended to diagnose MRSA infection nor to guide or monitor treatment for MRSA infections. Test performance is not FDA approved in patients less than 22 years old. Performed at [facility], [address] [place], [place] [address]   Expectorated Sputum Assessment w Gram Stain, Rflx to Resp Cult     Status: None   Collection Time: 03/06/23  1:50 PM   Specimen: Sputum  Result Value Ref Range Status   Specimen Description SPUTUM  Final   Special Requests NONE  Final   Sputum evaluation   Final    THIS SPECIMEN IS ACCEPTABLE FOR SPUTUM CULTURE Performed at [facility], [address] [place], [place] [address]    Report Status 03/06/2023 FINAL  Final  Acid Fast Smear (AFB)     Status: None   Collection Time: 03/06/23  1:50 PM   Specimen: Sputum  Result Value Ref Range Status   AFB Specimen Processing Concentration  Final   Acid Fast Smear Negative  Final    Comment: (NOTE) Performed At: [facility] [address] [place], [place] [id number] Jolene Schimke MD QI:[id number]    Source (AFB) SPUTUM  Final    Comment: Performed at [facility], [address] [place], [place] [address]  Culture, Respiratory w Gram Stain     Status: None   Collection Time: 03/06/23  1:50 PM   Specimen: SPU  Result Value Ref Range Status   Specimen Description   Final    SPUTUM Performed at [facility], [address], [place], [place] [address]    Special Requests   Final    NONE Reflexed from [id number] Performed at [facility], [address] [place], [place] [address]    Gram Stain   Final    MODERATE WBC PRESENT,BOTH PMN AND MONONUCLEAR RARE GRAM NEGATIVE RODS    Culture   Final    Normal respiratory flora-no Staph aureus or Pseudomonas seen Performed at [facility], [address], [place], [place] [address]  Report Status 03/09/2023  FINAL  Final  Culture, Respiratory w Gram Stain     Status: None (Preliminary result)   Collection Time: 03/06/23  6:30 PM   Specimen: Sputum; Respiratory  Result Value Ref Range Status   Specimen Description   Final    SPU Performed at [facility] [address] [place], [place] [address]    Special Requests   Final    NONE Performed at [facility], [address] [place], [place] [address]    Gram Stain   Final    ABUNDANT WBC PRESENT, PREDOMINANTLY PMN NO ORGANISMS SEEN    Culture   Final    Normal respiratory flora-no Staph aureus or Pseudomonas seen Performed at [facility], [address], [place], [place] [address]    Report Status PENDING  Incomplete  Acid Fast Smear (AFB)     Status: None   Collection Time: 03/06/23  6:30 PM   Specimen: Sputum  Result Value Ref Range Status   AFB Specimen Processing Concentration  Final   Acid Fast Smear Negative  Final    Comment: (NOTE) Performed At: [facility] [address] [place], [place] [id number] Jolene Schimke MD ZD:[id number]    Source (AFB) SPUTUM  Final    Comment: Performed at [facility], [address] [place], [place] [address]  MTB-RIF NAA with AFB Culture, sputum (q8 x 3)     Status: None (Preliminary result)   Collection Time: 03/08/23  6:35 AM   Specimen: Sputum  Result Value Ref Range Status   Myco tuberculosis Complex PENDING  Incomplete   Rifampin PENDING  Incomplete   AFB Specimen Processing Concentration  Final    Comment: (NOTE) Performed At: [facility] [address] [place], [place] [id number] Jolene Schimke MD [id number]    Acid Fast Culture PENDING  Incomplete   Source (MTB RIF) EXPECTORATED SPUTUM  Final    Comment: Performed at [facility], [address] [place], [place] [address]  Aerobic/Anaerobic Culture w Gram Stain (surgical/deep wound)     Status: None (Preliminary result)   Collection Time: 03/08/23 12:58  PM   Specimen: Abscess  Result Value Ref Range Status   Specimen Description   Final    ABSCESS Performed at [facility], [address] [place], [place] [address]    Special Requests   Final    LEFT CHEST EMPYEMA Performed at [facility], [address] [place], [place] [address]    Gram Stain   Final    ABUNDANT WBC PRESENT,BOTH PMN AND MONONUCLEAR NO ORGANISMS SEEN    Culture   Final    NO GROWTH < 24 HOURS Performed at [facility], [address], [place], [place] [address]    Report Status PENDING  Incomplete  MTB-RIF NAA with AFB Culture, sputum (q8 x 3)     Status: None (Preliminary result)   Collection Time: 03/08/23  1:15 PM   Specimen: Sputum  Result Value Ref Range Status   Myco tuberculosis Complex PENDING  Incomplete   Rifampin PENDING  Incomplete   AFB Specimen Processing Concentration  Final    Comment: (NOTE) Performed At: [facility] [address] [place], [place] [id number] Jolene Schimke MD BT:[id number]    Acid Fast Culture PENDING  Incomplete   Source (MTB RIF) SPUTUM  Final    Comment: Performed at [facility], [address], [place], [place] [address]  Radiology Studies: DG Chest Port 1 View  Result Date: 03/09/2023 CLINICAL DATA:  [id number] Chest tube in place [id number]. EXAM: PORTABLE CHEST 1 VIEW COMPARISON:  Chest radiograph 03/05/2023. FINDINGS: Interval placement of a left basilar pleural drainage catheter with decreased left pleural effusion. Persistent left basilar atelectasis. No consolidation or pulmonary edema. Stable cardiac and mediastinal contours. No pneumothorax. IMPRESSION: Interval placement of a left basilar pleural drainage catheter with decreased left pleural effusion. Electronically Signed   By: Orvan Falconer M.D.   On: 03/09/2023 09:23   CT [facility] PLEURAL DRAIN W/INDWELL CATH W/IMG GUIDE  Result Date: 03/08/2023 INDICATION: Loculated pleural effusion.  Empyema. EXAM:  CT-GUIDED LEFT NON-TUNNELED PLEURAL DRAINAGE CATHETER PLACEMENT COMPARISON:  CT chest and chest XR, 03/05/2023. MEDICATIONS: The patient is currently admitted to the hospital and receiving intravenous antibiotics. The antibiotics were administered within an appropriate time frame prior to the initiation of the procedure. ANESTHESIA/SEDATION: Moderate (conscious) sedation was employed during this procedure. A total of Versed 2 mg and Fentanyl 100 mcg was administered intravenously. Moderate Sedation Time: 15 minutes. The patient's level of consciousness and vital signs were monitored continuously by radiology nursing throughout the procedure under my direct supervision. CONTRAST:  None COMPLICATIONS: None immediate. PROCEDURE: RADIATION DOSE REDUCTION: This exam was performed according to the departmental dose-optimization program which includes automated exposure control, adjustment of the mA and/or kV according to patient size and/or use of iterative reconstruction technique. Informed written consent was obtained from the patient and/or patient's representative after a discussion of the risks, benefits and alternatives to treatment. The patient was placed RIGHT lateral decubitus on the CT gantry and a pre procedural CT was performed re-demonstrating the known abscess/fluid collection within the LEFT chest. The procedure was planned. A timeout was performed prior to the initiation of the procedure. The LEFT chest was prepped and draped in the usual sterile fashion. The overlying soft tissues were anesthetized with 1% lidocaine with epinephrine. Appropriate trajectory was planned with the use of a 22 gauge spinal needle. An 18 gauge trocar needle was advanced into the abscess/fluid collection and a short Amplatz super stiff wire was coiled within the collection. Appropriate positioning was confirmed with a limited CT scan. The tract was serially dilated allowing placement of a 12 Fr drainage catheter. Appropriate  positioning was confirmed with a limited postprocedural CT scan. 20 ml of purulent fluid was aspirated. The tube was connected to a pleura vac suction and sutured in place. A dressing was placed. The patient tolerated the procedure well without immediate post procedural complication. IMPRESSION: Successful CT guided placement of a 12 Fr LEFT chest non-tunneled pleural drainage catheter, with aspiration of 20 mL of purulent fluid. Samples were sent to the laboratory as requested by the ordering clinical team. Roanna Banning, MD [facility] Radiology Electronically Signed   By: Roanna Banning M.D.   On: 03/08/2023 16:11        Scheduled Meds:  amoxicillin-clavulanate  1 tablet Oral Q12H   benzonatate  200 mg Oral BID   enoxaparin (LOVENOX) injection  40 mg Subcutaneous Q24H   ferrous sulfate  325 mg Oral BID WC   folic acid  1 mg Oral Daily   gabapentin  300 mg Oral TID   guaiFENesin  1,200 mg Oral BID   lactulose  30 g Oral Once   lidocaine (PF)  10 mL Intradermal Once   multivitamin with minerals  1 tablet Oral Daily   nicotine  21 mg Transdermal Daily  polyethylene glycol  34 g Oral Daily   rifaximin  550 mg Oral BID   spiritus frumenti  1 each Oral TID   thiamine  100 mg Oral Daily   Or   thiamine  100 mg Intravenous Daily   Continuous Infusions:  sodium chloride Stopped (03/07/23 1818)   sodium chloride 100 mL/hr at 03/10/23 1022     LOS: 5 days     Silvano Bilis, MD [facility]   If 7PM-7AM, please contact night-coverage  03/10/2023, 11:13 AM

## 2023-03-10 NOTE — Plan of Care (Signed)
Problem: Education: Goal: Knowledge of General Education information will improve Description: Including pain rating scale, medication(s)/side effects and non-pharmacologic comfort measures Outcome: Progressing   Problem: Health Behavior/Discharge Planning: Goal: Ability to manage health-related needs will improve Outcome: Progressing   Problem: Clinical Measurements: Goal: Ability to maintain clinical measurements within normal limits will improve Outcome: Progressing Goal: Will remain free from infection Outcome: Progressing Goal: Respiratory complications will improve Outcome: Progressing   Problem: Activity: Goal: Risk for activity intolerance will decrease Outcome: Progressing   Problem: Nutrition: Goal: Adequate nutrition will be maintained Outcome: Progressing   Problem: Pain Managment: Goal: General experience of comfort will improve Outcome: Progressing

## 2023-03-10 NOTE — Progress Notes (Signed)
  Echocardiogram 2D Echocardiogram has been performed.  Keith Parker 03/10/2023, 11:32 AM

## 2023-03-10 NOTE — Plan of Care (Signed)

## 2023-03-11 ENCOUNTER — Inpatient Hospital Stay: Payer: Medicare Other

## 2023-03-11 DIAGNOSIS — J189 Pneumonia, unspecified organism: Secondary | ICD-10-CM | POA: Diagnosis not present

## 2023-03-11 DIAGNOSIS — J984 Other disorders of lung: Secondary | ICD-10-CM | POA: Diagnosis not present

## 2023-03-11 LAB — CBC
HCT: 30.6 % — ABNORMAL LOW (ref 39.0–52.0)
Hemoglobin: 9.4 g/dL — ABNORMAL LOW (ref 13.0–17.0)
MCH: 28.2 pg (ref 26.0–34.0)
MCHC: 30.7 g/dL (ref 30.0–36.0)
MCV: 91.9 fL (ref 80.0–100.0)
Platelets: 322 10*3/uL (ref 150–400)
RBC: 3.33 MIL/uL — ABNORMAL LOW (ref 4.22–5.81)
RDW: 16.2 % — ABNORMAL HIGH (ref 11.5–15.5)
WBC: 7.9 10*3/uL (ref 4.0–10.5)
nRBC: 0 % (ref 0.0–0.2)

## 2023-03-11 LAB — BASIC METABOLIC PANEL
Anion gap: 6 (ref 5–15)
BUN: 10 mg/dL (ref 6–20)
CO2: 26 mmol/L (ref 22–32)
Calcium: 8.5 mg/dL — ABNORMAL LOW (ref 8.9–10.3)
Chloride: 103 mmol/L (ref 98–111)
Creatinine, Ser: 0.56 mg/dL — ABNORMAL LOW (ref 0.61–1.24)
GFR, Estimated: >60 mL/min (ref >60)
Glucose, Bld: 85 mg/dL (ref 70–99)
Potassium: 4.5 mmol/L (ref 3.5–5.1)
Sodium: 135 mmol/L (ref 135–145)

## 2023-03-11 LAB — C-REACTIVE PROTEIN: CRP: 4.3 mg/dL — ABNORMAL HIGH (ref <1.0)

## 2023-03-11 LAB — PROCALCITONIN: Procalcitonin: 0.13 ng/mL

## 2023-03-11 MED ORDER — ACETAMINOPHEN 500 MG PO TABS
1000.0000 mg | ORAL_TABLET | Freq: Four times a day (QID) | ORAL | Status: DC | PRN
Start: 2023-03-11 — End: 2023-03-11

## 2023-03-11 MED ORDER — ACETAMINOPHEN 500 MG PO TABS
500.0000 mg | ORAL_TABLET | Freq: Four times a day (QID) | ORAL | Status: DC | PRN
  Administered 2023-03-13: 500 mg via ORAL
  Filled 2023-03-11: qty 1

## 2023-03-11 NOTE — Progress Notes (Signed)
PROGRESS NOTE    Keith Parker  ZOX:[id number] DOB: January 24, 1976 DOA: 03/05/2023 PCP: Leanna Sato, MD    Brief Narrative:  47 y.o. male with medical history significant of alcoholic liver cirrhosis, polio with chronic right leg pain, anxiety/depression, presented with worsening of productive cough, loss of appetite, malaise and weight loss.   Symptoms started 6 months ago, patient started develop productive cough with thick brownish sputum, night> day, associated with loss of appetite and malaise.  He does not see any blood streaks in the sputum.  Denies any fever chills no night sweat.  He lost about 25 pounds from 120 to 95 pounds in 6 months.  Yesterday he went to see PCP who recommended patient come to ED for evaluation.  9/10: Seen in consultation by pulmonary.  Interventional radiology consultation requested.  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      Assessment & Plan:   Principal Problem:   Cavitary pneumonia Active Problems:   Alcoholic cirrhosis of liver with ascites (HCC)   Weight loss   Lobar pneumonia (HCC)   Hydropneumothorax   Protein-calorie malnutrition, severe   Left lung cavity pneumonia Bronchopleural fistula Hydropneumothorax  Infection and malignancy both on differential.  TB remains on differential given background of alcoholism and living in halfway house/group home.  Pulmonary following.  Interventional radiology engaged. Plan: IV Zosyn transitioned to augmentin by pulm Follow-up quant of urine in ED Sputum for AFB neg x2, pcr/cultures pending Airborne isolation Legionella neg Strep pneumo urinary antigen neg Histoplasma urinary antigen Sputum respiratory culture normal growth Stress incentive spirometry use IR chest tube mgmt per pulm, cultures negative to date Will discuss bronchopleural fistula w/ CT surg tomorrow (on call physician this weekend doesn't handle chest) Pulm to order cytology for pleural  fluid  Hypotension Improved, lactate normalized. Stable now off fluids  Hyponatremia Appears euvolemic.  Possible SIADH secondary to lung lesion Sodium and osms are low, some degree of dehydration probably contributing as sodium has responded to fluids, sodium today 135   Anemia, normocytic Iron deficiency anemia Iron indices low. Hgb stable in the 8s, 9s today Plan: Received IV Venofer 300 mg x 1   Liver cirrhosis -Blood pressure borderline low probably secondary to chronic poor nutrition and weight loss, hold off on Lasix and Aldactone -Continue rifaximin   Alcohol abuse Patient drinks approximately 40 ounces alcohol per day.  Not interested in quitting at this time. No s/s withdrawal. Hcv/hiv neg Plan: Will order patient beer in the hospital  Tobacco abuse Nicotine patch  DVT prophylaxis: SQ Lovenox Code Status: Full Family Communication: None Disposition Plan: Status is: Inpatient Remains inpatient appropriate because: Cavitary lung lesion   Level of care: Progressive  Consultants:  Pulmonary Interventional radiology  Procedures:  IR chest tube 9/12  Antimicrobials: Zosyn   Subjective: Seen and examined.  Resting in bed. Cough a bit improved. Reports good energy. Had BM yesterday  Objective: Vitals:   03/10/23 2000 03/11/23 0418 03/11/23 0811 03/11/23 1251  BP: 106/76 96/64 101/75 102/70  Pulse:  81 73 80  Resp: 20 16 16 20   Temp: 98.6 F (37 C) 98 F (36.7 C) 98.4 F (36.9 C) 98.6 F (37 C)  TempSrc:   Oral Oral  SpO2: 100% 100% 100% 100%  Weight:      Height:        Intake/Output Summary (Last 24 hours) at 03/11/2023 1357 Last data filed at 03/11/2023 0946 Gross per 24 hour  Intake  [place] [id number] Jolene Schimke MD [id number]    Acid Fast Culture PENDING  Incomplete   Source (MTB RIF) SPUTUM  Final    Comment: Performed at [facility], [address] [place], [place] [address]         Radiology Studies: DG Chest Port 1 View  Result Date: 03/11/2023 CLINICAL DATA:  Pleural effusion EXAM: PORTABLE CHEST 1 VIEW COMPARISON:  03/09/2023 FINDINGS: Unremarkable cardiac silhouette. Left base consolidation or volume loss and left-sided small pleural effusion with basilar chest tube in place. No pneumothorax. Right lung clear. Normal pulmonary vasculature. IMPRESSION: Small residual left-sided pleural effusion with a chest tube in place. No pneumothorax. Electronically Signed   By: Layla Maw M.D.   On: 03/11/2023 11:58    ECHOCARDIOGRAM COMPLETE  Result Date: 03/10/2023    ECHOCARDIOGRAM REPORT   [name] Date of Exam: 03/10/2023 Medical Rec #:  [id number]    Height:       61.0 in Accession #:    [id number]   Weight:       104.9 lb Date of Birth:  11-06-1975    BSA:          1.436 m Patient Age:    47 years     BP:           116/74 mmHg Patient Gender: M            HR:           101 bpm. Exam Location:  [facility] Procedure: 2D Echo Indications:     Hypotension  History:         Patient has prior history of Echocardiogram examinations, most                  recent 09/21/2021.  Sonographer:     Overton Mam RDCS, FASE Referring Phys:  [id number] Wilfred Curtis UVOZ Diagnosing Phys: Debbe Odea MD  Sonographer Comments: Technically difficult study due to poor echo windows. Image acquisition challenging due to respiratory motion. This was a very challenging study due to patient coughing and left side chest tube in place. IMPRESSIONS  1. Left ventricular ejection fraction, by estimation, is 60 to 65%. The left ventricle has normal function. The left ventricle has no regional wall motion abnormalities. Left ventricular diastolic parameters were normal.  2. Right ventricular systolic function is normal. The right ventricular size is normal.  3. The mitral valve is normal in structure. Trivial mitral valve regurgitation.  4. The aortic valve is tricuspid. Aortic valve regurgitation is mild. Aortic valve sclerosis/calcification is present, without any evidence of aortic stenosis.  5. The inferior vena cava is normal in size with greater than 50% respiratory variability, suggesting right atrial pressure of 3 mmHg. FINDINGS  Left Ventricle: Left ventricular ejection fraction, by estimation, is 60 to 65%. The left ventricle has normal function. The left ventricle has no regional wall motion abnormalities. The left ventricular internal cavity size was normal in size. There is  no left ventricular hypertrophy. Left  ventricular diastolic parameters were normal. Right Ventricle: The right ventricular size is normal. No increase in right ventricular wall thickness. Right ventricular systolic function is normal. Left Atrium: Left atrial size was normal in size. Right Atrium: Right atrial size was normal in size. Pericardium: There is no evidence of pericardial effusion. Mitral Valve: The mitral valve is normal in structure. Trivial mitral valve regurgitation. Tricuspid Valve: The tricuspid valve is normal in structure. Tricuspid valve regurgitation is  PROGRESS NOTE    Keith Parker  ZOX:[id number] DOB: January 24, 1976 DOA: 03/05/2023 PCP: Leanna Sato, MD    Brief Narrative:  47 y.o. male with medical history significant of alcoholic liver cirrhosis, polio with chronic right leg pain, anxiety/depression, presented with worsening of productive cough, loss of appetite, malaise and weight loss.   Symptoms started 6 months ago, patient started develop productive cough with thick brownish sputum, night> day, associated with loss of appetite and malaise.  He does not see any blood streaks in the sputum.  Denies any fever chills no night sweat.  He lost about 25 pounds from 120 to 95 pounds in 6 months.  Yesterday he went to see PCP who recommended patient come to ED for evaluation.  9/10: Seen in consultation by pulmonary.  Interventional radiology consultation requested.  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      Assessment & Plan:   Principal Problem:   Cavitary pneumonia Active Problems:   Alcoholic cirrhosis of liver with ascites (HCC)   Weight loss   Lobar pneumonia (HCC)   Hydropneumothorax   Protein-calorie malnutrition, severe   Left lung cavity pneumonia Bronchopleural fistula Hydropneumothorax  Infection and malignancy both on differential.  TB remains on differential given background of alcoholism and living in halfway house/group home.  Pulmonary following.  Interventional radiology engaged. Plan: IV Zosyn transitioned to augmentin by pulm Follow-up quant of urine in ED Sputum for AFB neg x2, pcr/cultures pending Airborne isolation Legionella neg Strep pneumo urinary antigen neg Histoplasma urinary antigen Sputum respiratory culture normal growth Stress incentive spirometry use IR chest tube mgmt per pulm, cultures negative to date Will discuss bronchopleural fistula w/ CT surg tomorrow (on call physician this weekend doesn't handle chest) Pulm to order cytology for pleural  fluid  Hypotension Improved, lactate normalized. Stable now off fluids  Hyponatremia Appears euvolemic.  Possible SIADH secondary to lung lesion Sodium and osms are low, some degree of dehydration probably contributing as sodium has responded to fluids, sodium today 135   Anemia, normocytic Iron deficiency anemia Iron indices low. Hgb stable in the 8s, 9s today Plan: Received IV Venofer 300 mg x 1   Liver cirrhosis -Blood pressure borderline low probably secondary to chronic poor nutrition and weight loss, hold off on Lasix and Aldactone -Continue rifaximin   Alcohol abuse Patient drinks approximately 40 ounces alcohol per day.  Not interested in quitting at this time. No s/s withdrawal. Hcv/hiv neg Plan: Will order patient beer in the hospital  Tobacco abuse Nicotine patch  DVT prophylaxis: SQ Lovenox Code Status: Full Family Communication: None Disposition Plan: Status is: Inpatient Remains inpatient appropriate because: Cavitary lung lesion   Level of care: Progressive  Consultants:  Pulmonary Interventional radiology  Procedures:  IR chest tube 9/12  Antimicrobials: Zosyn   Subjective: Seen and examined.  Resting in bed. Cough a bit improved. Reports good energy. Had BM yesterday  Objective: Vitals:   03/10/23 2000 03/11/23 0418 03/11/23 0811 03/11/23 1251  BP: 106/76 96/64 101/75 102/70  Pulse:  81 73 80  Resp: 20 16 16 20   Temp: 98.6 F (37 C) 98 F (36.7 C) 98.4 F (36.9 C) 98.6 F (37 C)  TempSrc:   Oral Oral  SpO2: 100% 100% 100% 100%  Weight:      Height:        Intake/Output Summary (Last 24 hours) at 03/11/2023 1357 Last data filed at 03/11/2023 0946 Gross per 24 hour  Intake  [place] [id number] Jolene Schimke MD [id number]    Acid Fast Culture PENDING  Incomplete   Source (MTB RIF) SPUTUM  Final    Comment: Performed at [facility], [address] [place], [place] [address]         Radiology Studies: DG Chest Port 1 View  Result Date: 03/11/2023 CLINICAL DATA:  Pleural effusion EXAM: PORTABLE CHEST 1 VIEW COMPARISON:  03/09/2023 FINDINGS: Unremarkable cardiac silhouette. Left base consolidation or volume loss and left-sided small pleural effusion with basilar chest tube in place. No pneumothorax. Right lung clear. Normal pulmonary vasculature. IMPRESSION: Small residual left-sided pleural effusion with a chest tube in place. No pneumothorax. Electronically Signed   By: Layla Maw M.D.   On: 03/11/2023 11:58    ECHOCARDIOGRAM COMPLETE  Result Date: 03/10/2023    ECHOCARDIOGRAM REPORT   [name] Date of Exam: 03/10/2023 Medical Rec #:  [id number]    Height:       61.0 in Accession #:    [id number]   Weight:       104.9 lb Date of Birth:  11-06-1975    BSA:          1.436 m Patient Age:    47 years     BP:           116/74 mmHg Patient Gender: M            HR:           101 bpm. Exam Location:  [facility] Procedure: 2D Echo Indications:     Hypotension  History:         Patient has prior history of Echocardiogram examinations, most                  recent 09/21/2021.  Sonographer:     Overton Mam RDCS, FASE Referring Phys:  [id number] Wilfred Curtis UVOZ Diagnosing Phys: Debbe Odea MD  Sonographer Comments: Technically difficult study due to poor echo windows. Image acquisition challenging due to respiratory motion. This was a very challenging study due to patient coughing and left side chest tube in place. IMPRESSIONS  1. Left ventricular ejection fraction, by estimation, is 60 to 65%. The left ventricle has normal function. The left ventricle has no regional wall motion abnormalities. Left ventricular diastolic parameters were normal.  2. Right ventricular systolic function is normal. The right ventricular size is normal.  3. The mitral valve is normal in structure. Trivial mitral valve regurgitation.  4. The aortic valve is tricuspid. Aortic valve regurgitation is mild. Aortic valve sclerosis/calcification is present, without any evidence of aortic stenosis.  5. The inferior vena cava is normal in size with greater than 50% respiratory variability, suggesting right atrial pressure of 3 mmHg. FINDINGS  Left Ventricle: Left ventricular ejection fraction, by estimation, is 60 to 65%. The left ventricle has normal function. The left ventricle has no regional wall motion abnormalities. The left ventricular internal cavity size was normal in size. There is  no left ventricular hypertrophy. Left  ventricular diastolic parameters were normal. Right Ventricle: The right ventricular size is normal. No increase in right ventricular wall thickness. Right ventricular systolic function is normal. Left Atrium: Left atrial size was normal in size. Right Atrium: Right atrial size was normal in size. Pericardium: There is no evidence of pericardial effusion. Mitral Valve: The mitral valve is normal in structure. Trivial mitral valve regurgitation. Tricuspid Valve: The tricuspid valve is normal in structure. Tricuspid valve regurgitation is  PROGRESS NOTE    Keith Parker  ZOX:[id number] DOB: January 24, 1976 DOA: 03/05/2023 PCP: Leanna Sato, MD    Brief Narrative:  47 y.o. male with medical history significant of alcoholic liver cirrhosis, polio with chronic right leg pain, anxiety/depression, presented with worsening of productive cough, loss of appetite, malaise and weight loss.   Symptoms started 6 months ago, patient started develop productive cough with thick brownish sputum, night> day, associated with loss of appetite and malaise.  He does not see any blood streaks in the sputum.  Denies any fever chills no night sweat.  He lost about 25 pounds from 120 to 95 pounds in 6 months.  Yesterday he went to see PCP who recommended patient come to ED for evaluation.  9/10: Seen in consultation by pulmonary.  Interventional radiology consultation requested.  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      Assessment & Plan:   Principal Problem:   Cavitary pneumonia Active Problems:   Alcoholic cirrhosis of liver with ascites (HCC)   Weight loss   Lobar pneumonia (HCC)   Hydropneumothorax   Protein-calorie malnutrition, severe   Left lung cavity pneumonia Bronchopleural fistula Hydropneumothorax  Infection and malignancy both on differential.  TB remains on differential given background of alcoholism and living in halfway house/group home.  Pulmonary following.  Interventional radiology engaged. Plan: IV Zosyn transitioned to augmentin by pulm Follow-up quant of urine in ED Sputum for AFB neg x2, pcr/cultures pending Airborne isolation Legionella neg Strep pneumo urinary antigen neg Histoplasma urinary antigen Sputum respiratory culture normal growth Stress incentive spirometry use IR chest tube mgmt per pulm, cultures negative to date Will discuss bronchopleural fistula w/ CT surg tomorrow (on call physician this weekend doesn't handle chest) Pulm to order cytology for pleural  fluid  Hypotension Improved, lactate normalized. Stable now off fluids  Hyponatremia Appears euvolemic.  Possible SIADH secondary to lung lesion Sodium and osms are low, some degree of dehydration probably contributing as sodium has responded to fluids, sodium today 135   Anemia, normocytic Iron deficiency anemia Iron indices low. Hgb stable in the 8s, 9s today Plan: Received IV Venofer 300 mg x 1   Liver cirrhosis -Blood pressure borderline low probably secondary to chronic poor nutrition and weight loss, hold off on Lasix and Aldactone -Continue rifaximin   Alcohol abuse Patient drinks approximately 40 ounces alcohol per day.  Not interested in quitting at this time. No s/s withdrawal. Hcv/hiv neg Plan: Will order patient beer in the hospital  Tobacco abuse Nicotine patch  DVT prophylaxis: SQ Lovenox Code Status: Full Family Communication: None Disposition Plan: Status is: Inpatient Remains inpatient appropriate because: Cavitary lung lesion   Level of care: Progressive  Consultants:  Pulmonary Interventional radiology  Procedures:  IR chest tube 9/12  Antimicrobials: Zosyn   Subjective: Seen and examined.  Resting in bed. Cough a bit improved. Reports good energy. Had BM yesterday  Objective: Vitals:   03/10/23 2000 03/11/23 0418 03/11/23 0811 03/11/23 1251  BP: 106/76 96/64 101/75 102/70  Pulse:  81 73 80  Resp: 20 16 16 20   Temp: 98.6 F (37 C) 98 F (36.7 C) 98.4 F (36.9 C) 98.6 F (37 C)  TempSrc:   Oral Oral  SpO2: 100% 100% 100% 100%  Weight:      Height:        Intake/Output Summary (Last 24 hours) at 03/11/2023 1357 Last data filed at 03/11/2023 0946 Gross per 24 hour  Intake  [place] [id number] Jolene Schimke MD [id number]    Acid Fast Culture PENDING  Incomplete   Source (MTB RIF) SPUTUM  Final    Comment: Performed at [facility], [address] [place], [place] [address]         Radiology Studies: DG Chest Port 1 View  Result Date: 03/11/2023 CLINICAL DATA:  Pleural effusion EXAM: PORTABLE CHEST 1 VIEW COMPARISON:  03/09/2023 FINDINGS: Unremarkable cardiac silhouette. Left base consolidation or volume loss and left-sided small pleural effusion with basilar chest tube in place. No pneumothorax. Right lung clear. Normal pulmonary vasculature. IMPRESSION: Small residual left-sided pleural effusion with a chest tube in place. No pneumothorax. Electronically Signed   By: Layla Maw M.D.   On: 03/11/2023 11:58    ECHOCARDIOGRAM COMPLETE  Result Date: 03/10/2023    ECHOCARDIOGRAM REPORT   [name] Date of Exam: 03/10/2023 Medical Rec #:  [id number]    Height:       61.0 in Accession #:    [id number]   Weight:       104.9 lb Date of Birth:  11-06-1975    BSA:          1.436 m Patient Age:    47 years     BP:           116/74 mmHg Patient Gender: M            HR:           101 bpm. Exam Location:  [facility] Procedure: 2D Echo Indications:     Hypotension  History:         Patient has prior history of Echocardiogram examinations, most                  recent 09/21/2021.  Sonographer:     Overton Mam RDCS, FASE Referring Phys:  [id number] Wilfred Curtis UVOZ Diagnosing Phys: Debbe Odea MD  Sonographer Comments: Technically difficult study due to poor echo windows. Image acquisition challenging due to respiratory motion. This was a very challenging study due to patient coughing and left side chest tube in place. IMPRESSIONS  1. Left ventricular ejection fraction, by estimation, is 60 to 65%. The left ventricle has normal function. The left ventricle has no regional wall motion abnormalities. Left ventricular diastolic parameters were normal.  2. Right ventricular systolic function is normal. The right ventricular size is normal.  3. The mitral valve is normal in structure. Trivial mitral valve regurgitation.  4. The aortic valve is tricuspid. Aortic valve regurgitation is mild. Aortic valve sclerosis/calcification is present, without any evidence of aortic stenosis.  5. The inferior vena cava is normal in size with greater than 50% respiratory variability, suggesting right atrial pressure of 3 mmHg. FINDINGS  Left Ventricle: Left ventricular ejection fraction, by estimation, is 60 to 65%. The left ventricle has normal function. The left ventricle has no regional wall motion abnormalities. The left ventricular internal cavity size was normal in size. There is  no left ventricular hypertrophy. Left  ventricular diastolic parameters were normal. Right Ventricle: The right ventricular size is normal. No increase in right ventricular wall thickness. Right ventricular systolic function is normal. Left Atrium: Left atrial size was normal in size. Right Atrium: Right atrial size was normal in size. Pericardium: There is no evidence of pericardial effusion. Mitral Valve: The mitral valve is normal in structure. Trivial mitral valve regurgitation. Tricuspid Valve: The tricuspid valve is normal in structure. Tricuspid valve regurgitation is  PROGRESS NOTE    Keith Parker  ZOX:[id number] DOB: January 24, 1976 DOA: 03/05/2023 PCP: Leanna Sato, MD    Brief Narrative:  47 y.o. male with medical history significant of alcoholic liver cirrhosis, polio with chronic right leg pain, anxiety/depression, presented with worsening of productive cough, loss of appetite, malaise and weight loss.   Symptoms started 6 months ago, patient started develop productive cough with thick brownish sputum, night> day, associated with loss of appetite and malaise.  He does not see any blood streaks in the sputum.  Denies any fever chills no night sweat.  He lost about 25 pounds from 120 to 95 pounds in 6 months.  Yesterday he went to see PCP who recommended patient come to ED for evaluation.  9/10: Seen in consultation by pulmonary.  Interventional radiology consultation requested.  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      Assessment & Plan:   Principal Problem:   Cavitary pneumonia Active Problems:   Alcoholic cirrhosis of liver with ascites (HCC)   Weight loss   Lobar pneumonia (HCC)   Hydropneumothorax   Protein-calorie malnutrition, severe   Left lung cavity pneumonia Bronchopleural fistula Hydropneumothorax  Infection and malignancy both on differential.  TB remains on differential given background of alcoholism and living in halfway house/group home.  Pulmonary following.  Interventional radiology engaged. Plan: IV Zosyn transitioned to augmentin by pulm Follow-up quant of urine in ED Sputum for AFB neg x2, pcr/cultures pending Airborne isolation Legionella neg Strep pneumo urinary antigen neg Histoplasma urinary antigen Sputum respiratory culture normal growth Stress incentive spirometry use IR chest tube mgmt per pulm, cultures negative to date Will discuss bronchopleural fistula w/ CT surg tomorrow (on call physician this weekend doesn't handle chest) Pulm to order cytology for pleural  fluid  Hypotension Improved, lactate normalized. Stable now off fluids  Hyponatremia Appears euvolemic.  Possible SIADH secondary to lung lesion Sodium and osms are low, some degree of dehydration probably contributing as sodium has responded to fluids, sodium today 135   Anemia, normocytic Iron deficiency anemia Iron indices low. Hgb stable in the 8s, 9s today Plan: Received IV Venofer 300 mg x 1   Liver cirrhosis -Blood pressure borderline low probably secondary to chronic poor nutrition and weight loss, hold off on Lasix and Aldactone -Continue rifaximin   Alcohol abuse Patient drinks approximately 40 ounces alcohol per day.  Not interested in quitting at this time. No s/s withdrawal. Hcv/hiv neg Plan: Will order patient beer in the hospital  Tobacco abuse Nicotine patch  DVT prophylaxis: SQ Lovenox Code Status: Full Family Communication: None Disposition Plan: Status is: Inpatient Remains inpatient appropriate because: Cavitary lung lesion   Level of care: Progressive  Consultants:  Pulmonary Interventional radiology  Procedures:  IR chest tube 9/12  Antimicrobials: Zosyn   Subjective: Seen and examined.  Resting in bed. Cough a bit improved. Reports good energy. Had BM yesterday  Objective: Vitals:   03/10/23 2000 03/11/23 0418 03/11/23 0811 03/11/23 1251  BP: 106/76 96/64 101/75 102/70  Pulse:  81 73 80  Resp: 20 16 16 20   Temp: 98.6 F (37 C) 98 F (36.7 C) 98.4 F (36.9 C) 98.6 F (37 C)  TempSrc:   Oral Oral  SpO2: 100% 100% 100% 100%  Weight:      Height:        Intake/Output Summary (Last 24 hours) at 03/11/2023 1357 Last data filed at 03/11/2023 0946 Gross per 24 hour  Intake

## 2023-03-11 NOTE — Progress Notes (Addendum)
PULMONOLOGY         Date: 03/11/2023,   MRN# [id number] Keith Parker January 23, 1976     AdmissionWeight: 46.3 kg                 CurrentWeight: 47.6 kg  Referring provider: Dr Mikey College   CHIEF COMPLAINT:   Hydropneumothorax   HISTORY OF PRESENT ILLNESS   Patient reports exposure to black mold "very heavy" appx 1-2 years ago.  He has lived in this house with mold for appx 3 years.  He smokes actively, he smokes tobacco and THC.  He denies any other substances but admits to drinking alcohol.  He drinks 40Oz. Of beer daily.    03/07/23- patient is able to eat and is breathing on room air. Reports cough is worse. He has IR consult for chest tube but shares procedure was postponed.  He is on antibiotics with zosyn and vanco.  MRSA is negative. Sputum culture is negative and procalcitonin is flat both indicative of possible cancer as main etiology.   03/08/23- patient for chest tube today. CRP remains elevated and he may still require throacic surgery evaluation   03/09/23- patient had no overnigth events.  He feels better.  Chest tube management with + air leak on forced expiratory maneuver.  He slept well after fluid has been aspirated.  03/10/23- patient reports less pain at left lung.  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  We have requested thoracic surgery eval routine consultation. Patient stable. No signs of alcohol withdrawal.    03/11/23- patient reports mild pain at left axilary lower lung zone. Repeat cxr in process today. Bloodwork is stable.  Infectious workup is all negative thus far.  Air leak still present but actually reduced.  Will continue current antimicrobials empirically  PAST MEDICAL HISTORY   Past Medical History:  Diagnosis Date   Allergy    Arthritis    Depression    Elevated liver enzymes 10/29/2017   GERD (gastroesophageal reflux disease)    Hearing loss, conductive, bilateral 12/31/2017   Hypertension    Polio    Seizures (HCC)     last seizure about 7 years ago.    Substance abuse (HCC)    Alcohol abuse     SURGICAL HISTORY   Past Surgical History:  Procedure Laterality Date   COLONOSCOPY N/A 06/15/2022   Procedure: COLONOSCOPY;  Surgeon: Wyline Mood, MD;  Location: [facility] ENDOSCOPY;  Service: Gastroenterology;  Laterality: N/A;   COLONOSCOPY WITH PROPOFOL N/A 07/13/2021   Procedure: COLONOSCOPY WITH PROPOFOL;  Surgeon: Wyline Mood, MD;  Location: [facility] ENDOSCOPY;  Service: Gastroenterology;  Laterality: N/A;   ESOPHAGOGASTRODUODENOSCOPY N/A 07/13/2021   Procedure: ESOPHAGOGASTRODUODENOSCOPY (EGD);  Surgeon: Wyline Mood, MD;  Location: [facility] ENDOSCOPY;  Service: Gastroenterology;  Laterality: N/A;   ESOPHAGOGASTRODUODENOSCOPY (EGD) WITH PROPOFOL N/A 10/13/2021   Procedure: ESOPHAGOGASTRODUODENOSCOPY (EGD) WITH PROPOFOL;  Surgeon: Wyline Mood, MD;  Location: [facility] ENDOSCOPY;  Service: Gastroenterology;  Laterality: N/A;   ESOPHAGOGASTRODUODENOSCOPY (EGD) WITH PROPOFOL N/A 11/08/2021   Procedure: ESOPHAGOGASTRODUODENOSCOPY (EGD) WITH PROPOFOL;  Surgeon: Wyline Mood, MD;  Location: [facility] ENDOSCOPY;  Service: Gastroenterology;  Laterality: N/A;   ESOPHAGOGASTRODUODENOSCOPY (EGD) WITH PROPOFOL N/A 06/15/2022   Procedure: ESOPHAGOGASTRODUODENOSCOPY (EGD) WITH PROPOFOL;  Surgeon: Wyline Mood, MD;  Location: [facility] ENDOSCOPY;  Service: Gastroenterology;  Laterality: N/A;   LEG SURGERY  1988   8 surgeries-hips and legs for walking after getting polio- last one 1988   WRIST SURGERY     right  FAMILY HISTORY   Family History  Problem Relation Age of Onset   Cancer Mother        Breast   Arthritis Mother    Alcohol abuse Father    Arthritis Father    Hypertension Father    Cancer Maternal Grandmother        Breast   Hypertension Maternal Grandmother    Hypertension Maternal Grandfather    Hypertension Paternal Grandmother    Hypertension Paternal Grandfather    Drug abuse Maternal Uncle      SOCIAL HISTORY    Social History   Tobacco Use   Smoking status: Every Day    Current packs/day: 2.00    Average packs/day: 2.0 packs/day for 25.0 years (50.0 ttl pk-yrs)    Types: Cigarettes   Smokeless tobacco: Never  Vaping Use   Vaping status: Never Used  Substance Use Topics   Alcohol use: Yes    Alcohol/week: 70.0 standard drinks of alcohol    Types: 70 Cans of beer per week    Comment: 2 days ago   Drug use: No     MEDICATIONS    Home Medication:    Current Medication:  Current Facility-Administered Medications:    0.9 %  sodium chloride infusion, , Intravenous, PRN, Lolita Patella B, MD, Stopped at 03/07/23 1818   amoxicillin-clavulanate (AUGMENTIN) 875-125 MG per tablet 1 tablet, 1 tablet, Oral, Q12H, Vida Rigger, MD, 1 tablet at 03/11/23 1036   benzonatate (TESSALON) capsule 200 mg, 200 mg, Oral, BID, Mikey College T, MD, 200 mg at 03/11/23 1037   cyclobenzaprine (FLEXERIL) tablet 7.5 mg, 7.5 mg, Oral, TID PRN, Mikey College T, MD, 7.5 mg at 03/11/23 0736   enoxaparin (LOVENOX) injection 40 mg, 40 mg, Subcutaneous, Q24H, Mikey College T, MD, 40 mg at 03/11/23 0737   ferrous sulfate tablet 325 mg, 325 mg, Oral, BID WC, Sreenath, Sudheer B, MD, 325 mg at 03/11/23 0736   folic acid (FOLVITE) tablet 1 mg, 1 mg, Oral, Daily, Mikey College T, MD, 1 mg at 03/11/23 1036   gabapentin (NEURONTIN) capsule 300 mg, 300 mg, Oral, TID, Mikey College T, MD, 300 mg at 03/11/23 1037   guaiFENesin (MUCINEX) 12 hr tablet 1,200 mg, 1,200 mg, Oral, BID, Mikey College T, MD, 1,200 mg at 03/11/23 1036   hydrOXYzine (ATARAX) tablet 50 mg, 50 mg, Oral, Q8H PRN, Wouk, Wilfred Curtis, MD, 50 mg at 03/10/23 2213   ketorolac (TORADOL) 15 MG/ML injection 15 mg, 15 mg, Intravenous, Q8H PRN, Wouk, Wilfred Curtis, MD, 15 mg at 03/10/23 2204   lidocaine (PF) (XYLOCAINE) 1 % injection 10 mL, 10 mL, Intradermal, Once, Mugweru, Jon, MD   multivitamin with minerals tablet 1 tablet, 1 tablet, Oral, Daily, Mikey College T, MD, 1  tablet at 03/11/23 1036   nicotine (NICODERM CQ - dosed in mg/24 hours) patch 21 mg, 21 mg, Transdermal, Daily, Mikey College T, MD, 21 mg at 03/11/23 1036   oxyCODONE (Oxy IR/ROXICODONE) immediate release tablet 5 mg, 5 mg, Oral, Q6H PRN, Manuela Schwartz, NP, 5 mg at 03/11/23 1037   polyethylene glycol (MIRALAX / GLYCOLAX) packet 34 g, 34 g, Oral, Daily, Wouk, Wilfred Curtis, MD, 34 g at 03/09/23 0904   rifaximin (XIFAXAN) tablet 550 mg, 550 mg, Oral, BID, Mikey College T, MD, 550 mg at 03/11/23 1036   spiritus frumenti (ethyl alcohol) solution 1 each, 1 each, Oral, TID, Lolita Patella B, MD, 1 each at 03/11/23 1037   thiamine (VITAMIN B1) tablet 100 mg, 100  mg, Oral, Daily, 100 mg at 03/11/23 1036 **OR** thiamine (VITAMIN B1) injection 100 mg, 100 mg, Intravenous, Daily, Mikey College T, MD   traZODone (DESYREL) tablet 200 mg, 200 mg, Oral, QHS PRN, Mikey College T, MD, 200 mg at 03/07/23 2213    ALLERGIES   Patient has no known allergies.     REVIEW OF SYSTEMS    Review of Systems:  Gen:  Denies  fever, sweats, chills weigh loss  HEENT: Denies blurred vision, double vision, ear pain, eye pain, hearing loss, nose bleeds, sore throat Cardiac:  No dizziness, chest pain or heaviness, chest tightness,edema Resp:   reports dyspnea chronically  Gi: Denies swallowing difficulty, stomach pain, nausea or vomiting, diarrhea, constipation, bowel incontinence Gu:  Denies bladder incontinence, burning urine Ext:   Denies Joint pain, stiffness or swelling Skin: Denies  skin rash, easy bruising or bleeding or hives Endoc:  Denies polyuria, polydipsia , polyphagia or weight change Psych:   Denies depression, insomnia or hallucinations   Other:  All other systems negative   VS: BP 101/75 (BP Location: Right Arm)   Pulse 73   Temp 98.4 F (36.9 C) (Oral)   Resp 16   Ht 5\' 1"  (1.549 m)   Wt 47.6 kg   SpO2 100%   BMI 19.83 kg/m      PHYSICAL EXAM    GENERAL:NAD, no fevers, chills, no  weakness no fatigue HEAD: Normocephalic, atraumatic.  EYES: Pupils equal, round, reactive to light. Extraocular muscles intact. No scleral icterus.  MOUTH: Moist mucosal membrane. Dentition intact. No abscess noted.  EAR, NOSE, THROAT: Clear without exudates. No external lesions.  NECK: Supple. No thyromegaly. No nodules. No JVD.  PULMONARY: decreased breath sounds with mild rhonchi worse at bases bilaterally.  CARDIOVASCULAR: S1 and S2. Regular rate and rhythm. No murmurs, rubs, or gallops. No edema. Pedal pulses 2+ bilaterally.  GASTROINTESTINAL: Soft, nontender, nondistended. No masses. Positive bowel sounds. No hepatosplenomegaly.  MUSCULOSKELETAL: No swelling, clubbing, or edema. Range of motion full in all extremities.  NEUROLOGIC: Cranial nerves II through XII are intact. No gross focal neurological deficits. Sensation intact. Reflexes intact.  SKIN: No ulceration, lesions, rashes, or cyanosis. Skin warm and dry. Turgor intact.  PSYCHIATRIC: Mood, affect within normal limits. The patient is awake, alert and oriented x 3. Insight, judgment intact.       IMAGING       Impression  CLINICAL DATA:  Abnormal chest x-ray.  Lung mass.  Weight loss.   EXAM: CT CHEST WITH CONTRAST   TECHNIQUE: Multidetector CT imaging of the chest was performed during intravenous contrast administration.   RADIATION DOSE REDUCTION: This exam was performed according to the departmental dose-optimization program which includes automated exposure control, adjustment of the mA and/or kV according to patient size and/or use of iterative reconstruction technique.   CONTRAST:  75mL OMNIPAQUE IOHEXOL 300 MG/ML  SOLN   COMPARISON:  X-ray earlier 03/05/2023.  CT scan September 2015   FINDINGS: Cardiovascular: Heart is nonenlarged. No pericardial effusion. Normal caliber thoracic aorta.   Mediastinum/Nodes: No specific abnormal lymph node enlargement identified in the axillary regions. No abnormal  nodes in the right hilum. There are some small left hilar and mediastinal nodes. Example left infrahilar region measures 15 x 7 mm. Precarinal node measures 8 mm in short axis on series 2, image 64. Normal caliber thoracic esophagus. Preserved thyroid gland.   Lungs/Pleura: Diffuse breathing motion. Right lung is grossly clear without consolidation, pneumothorax or effusion. There is a 3  mm right lower lobe lung nodule on series 2, image 91. Not clearly seen previously in 2015.   However the left lung has a area of consolidation left lower lobe with some air bronchograms, associated pleural thickening and a loculated pleural effusion with some air, loculated hydropneumothorax. Overall this measures a proximally 9.3 x 2.6 cm in the axial plane and extends cephalocaudal of up to 19 cm proximally. This extends up to a cavitary lesion in the posterior left upper lobe abutting the pleura with thick irregular walls measuring 5.9 x 4.0 cm. Along the superior aspect of the consolidation left lower lobe is a nodular component extending into the superior segment and slightly crossing the interlobar fissure. This has a nodular component with spiculations on series 2, image 80 measures 2.0 by 1.7 cm.   There are some small nodules elsewhere in the left lung such as series 2, image 60 measuring 5 mm. Other foci on image 66.   Upper Abdomen: Adrenal glands are preserved in the upper abdomen. Stone in the nondilated gallbladder.   Musculoskeletal: Curvature of the thoracic spine with some mild degenerative changes.   IMPRESSION: 5.9 cm cavitary mass in the left upper lobe abutting the pleura with irregular and thickened margins. Associated loculated left hydropneumothorax extending down to the diaphragm with consolidative opacity in the left lower lobe.   Additional nodular component seen adjacent to the consolidation in the superior segment left lower lobe which may cross the pleura.    Overall differential would include atypical infection such as TB versus neoplasm amongst other differential. Recommend additional workup.   Few separate tiny lung nodules elsewhere and some prominent lymph nodes in the mediastinum and left hilum. Attention on short follow-up.   Gallstones.     Electronically Signed   By: Karen Kays M.D.   On: 03/05/2023 16:54    ASSESSMENT/PLAN   Left hydropneumothorax - patient does have some risk factors for TB including alcoholism and sharing a home with many people who come and go.   -He has smoked his entire life and also has malignancy risk  -he has never had cancer but does have family history including mother with breast cancer and grandmother with breast cancer and uncle with some kind of cancer. - patient would benefit from IR consultation and possible pleural drain.  He may need thoracic surgery evaluation.  -He has lost appx 40 lbs and is currently less then 100lbs with BMI <20.  This may be due to alcoholism vs cancer -He has hyponatremia and reports disequlibrium. 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  -currently he remain in empiric on Zosyn and Vancomycin IV with phramacy consultation -serum fungitell -legionella ab -strep pneumoniae ur AG -Histoplasma Ur Ag -sputum resp cultures -AFB sputum expectorated specimen -sputum cytology  -reviewed pertinent imaging with patient today - ESR/CRP/procalcitonin/MRSA PCR -please encourage patient to use incentive spirometer few times each hour while hospitalized.       Left chest parapneumonic effusion/empyema    - currently there is no bacteria growing in culture   - there is fibrinous debris with pus consistent with empyema   - he is coughing up more mucopurulent material    -he has completed zosyn IV and now on augmentin   - his wbc count is improved   - repeat cxr today   - may need thoracic surgery  evaluation       Thank you for allowing me to  participate in the care of this patient.   Patient/Family are satisfied with care plan and all questions have been answered.    Provider disclosure: Patient with at least one acute or chronic illness or injury that poses a threat to life or bodily function and is being managed actively during this encounter.  All of the below services have been performed independently by signing provider:  review of prior documentation from internal and or external health records.  Review of previous and current lab results.  Interview and comprehensive assessment during patient visit today. Review of current and previous chest radiographs/CT scans. Discussion of management and test interpretation with health care team and patient/family.   This document was prepared using Dragon voice recognition software and may include unintentional dictation errors.     Vida Rigger, M.D.  Division of Pulmonary & Critical Care Medicine

## 2023-03-11 NOTE — Plan of Care (Signed)
Alert and oriented. More depressed and anxious today than yesterday. Tolerating PRN Atarax for anxiety. LBM 9/14. Chest tube with output.   Problem: Education: Goal: Knowledge of General Education information will improve Description: Including pain rating scale, medication(s)/side effects and non-pharmacologic comfort measures Outcome: Progressing   Problem: Health Behavior/Discharge Planning: Goal: Ability to manage health-related needs will improve Outcome: Progressing   Problem: Clinical Measurements: Goal: Ability to maintain clinical measurements within normal limits will improve Outcome: Progressing Goal: Will remain free from infection Outcome: Progressing Goal: Diagnostic test results will improve Outcome: Progressing Goal: Respiratory complications will improve Outcome: Progressing Goal: Cardiovascular complication will be avoided Outcome: Progressing   Problem: Activity: Goal: Risk for activity intolerance will decrease Outcome: Progressing   Problem: Nutrition: Goal: Adequate nutrition will be maintained Outcome: Progressing   Problem: Coping: Goal: Level of anxiety will decrease Outcome: Progressing   Problem: Elimination: Goal: Will not experience complications related to bowel motility Outcome: Progressing Goal: Will not experience complications related to urinary retention Outcome: Progressing   Problem: Pain Managment: Goal: General experience of comfort will improve Outcome: Progressing   Problem: Safety: Goal: Ability to remain free from injury will improve Outcome: Progressing   Problem: Skin Integrity: Goal: Risk for impaired skin integrity will decrease Outcome: Progressing

## 2023-03-11 NOTE — Progress Notes (Signed)
PULMONOLOGY         Date: 03/11/2023,   MRN# [id number] Keith Parker March 21, 1976     AdmissionWeight: 46.3 kg                 CurrentWeight: 47.6 kg  Referring provider: Dr Mikey College   CHIEF COMPLAINT:   Hydropneumothorax   HISTORY OF PRESENT ILLNESS   Patient reports exposure to black mold "very heavy" appx 1-2 years ago.  He has lived in this house with mold for appx 3 years.  He smokes actively, he smokes tobacco and THC.  He denies any other substances but admits to drinking alcohol.  He drinks 40Oz. Of beer daily.    03/07/23- patient is able to eat and is breathing on room air. Reports cough is worse. He has IR consult for chest tube but shares procedure was postponed.  He is on antibiotics with zosyn and vanco.  MRSA is negative. Sputum culture is negative and procalcitonin is flat both indicative of possible cancer as main etiology.   03/08/23- patient for chest tube today. CRP remains elevated and he may still require throacic surgery evaluation   03/09/23- patient had no overnigth events.  He feels better.  Chest tube management with + air leak on forced expiratory maneuver.  He slept well after fluid has been aspirated.  03/10/23- patient reports less pain at left lung.  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  We have requested thoracic surgery eval routine consultation. Patient stable. No signs of alcohol withdrawal.     PAST MEDICAL HISTORY   Past Medical History:  Diagnosis Date   Allergy    Arthritis    Depression    Elevated liver enzymes 10/29/2017   GERD (gastroesophageal reflux disease)    Hearing loss, conductive, bilateral 12/31/2017   Hypertension    Polio    Seizures (HCC)    last seizure about 7 years ago.    Substance abuse (HCC)    Alcohol abuse     SURGICAL HISTORY   Past Surgical History:  Procedure Laterality Date   COLONOSCOPY N/A 06/15/2022   Procedure: COLONOSCOPY;  Surgeon: Wyline Mood, MD;  Location: [facility]  ENDOSCOPY;  Service: Gastroenterology;  Laterality: N/A;   COLONOSCOPY WITH PROPOFOL N/A 07/13/2021   Procedure: COLONOSCOPY WITH PROPOFOL;  Surgeon: Wyline Mood, MD;  Location: [facility] ENDOSCOPY;  Service: Gastroenterology;  Laterality: N/A;   ESOPHAGOGASTRODUODENOSCOPY N/A 07/13/2021   Procedure: ESOPHAGOGASTRODUODENOSCOPY (EGD);  Surgeon: Wyline Mood, MD;  Location: [facility] ENDOSCOPY;  Service: Gastroenterology;  Laterality: N/A;   ESOPHAGOGASTRODUODENOSCOPY (EGD) WITH PROPOFOL N/A 10/13/2021   Procedure: ESOPHAGOGASTRODUODENOSCOPY (EGD) WITH PROPOFOL;  Surgeon: Wyline Mood, MD;  Location: [facility] ENDOSCOPY;  Service: Gastroenterology;  Laterality: N/A;   ESOPHAGOGASTRODUODENOSCOPY (EGD) WITH PROPOFOL N/A 11/08/2021   Procedure: ESOPHAGOGASTRODUODENOSCOPY (EGD) WITH PROPOFOL;  Surgeon: Wyline Mood, MD;  Location: [facility] ENDOSCOPY;  Service: Gastroenterology;  Laterality: N/A;   ESOPHAGOGASTRODUODENOSCOPY (EGD) WITH PROPOFOL N/A 06/15/2022   Procedure: ESOPHAGOGASTRODUODENOSCOPY (EGD) WITH PROPOFOL;  Surgeon: Wyline Mood, MD;  Location: [facility] ENDOSCOPY;  Service: Gastroenterology;  Laterality: N/A;   LEG SURGERY  1988   8 surgeries-hips and legs for walking after getting polio- last one 1988   WRIST SURGERY     right     FAMILY HISTORY   Family History  Problem Relation Age of Onset   Cancer Mother        Breast   Arthritis Mother    Alcohol abuse Father    Arthritis  Father    Hypertension Father    Cancer Maternal Grandmother        Breast   Hypertension Maternal Grandmother    Hypertension Maternal Grandfather    Hypertension Paternal Grandmother    Hypertension Paternal Grandfather    Drug abuse Maternal Uncle      SOCIAL HISTORY   Social History   Tobacco Use   Smoking status: Every Day    Current packs/day: 2.00    Average packs/day: 2.0 packs/day for 25.0 years (50.0 ttl pk-yrs)    Types: Cigarettes   Smokeless tobacco: Never  Vaping Use   Vaping status: Never Used   Substance Use Topics   Alcohol use: Yes    Alcohol/week: 70.0 standard drinks of alcohol    Types: 70 Cans of beer per week    Comment: 2 days ago   Drug use: No     MEDICATIONS    Home Medication:    Current Medication:  Current Facility-Administered Medications:    0.9 %  sodium chloride infusion, , Intravenous, PRN, Lolita Patella B, MD, Stopped at 03/07/23 1818   amoxicillin-clavulanate (AUGMENTIN) 875-125 MG per tablet 1 tablet, 1 tablet, Oral, Q12H, Vida Rigger, MD, 1 tablet at 03/11/23 1036   benzonatate (TESSALON) capsule 200 mg, 200 mg, Oral, BID, Mikey College T, MD, 200 mg at 03/11/23 1037   cyclobenzaprine (FLEXERIL) tablet 7.5 mg, 7.5 mg, Oral, TID PRN, Mikey College T, MD, 7.5 mg at 03/11/23 0736   enoxaparin (LOVENOX) injection 40 mg, 40 mg, Subcutaneous, Q24H, Mikey College T, MD, 40 mg at 03/11/23 0737   ferrous sulfate tablet 325 mg, 325 mg, Oral, BID WC, Sreenath, Sudheer B, MD, 325 mg at 03/11/23 0736   folic acid (FOLVITE) tablet 1 mg, 1 mg, Oral, Daily, Chipper Herb, Ping T, MD, 1 mg at 03/11/23 1036   gabapentin (NEURONTIN) capsule 300 mg, 300 mg, Oral, TID, Mikey College T, MD, 300 mg at 03/11/23 1037   guaiFENesin (MUCINEX) 12 hr tablet 1,200 mg, 1,200 mg, Oral, BID, Mikey College T, MD, 1,200 mg at 03/11/23 1036   hydrOXYzine (ATARAX) tablet 50 mg, 50 mg, Oral, Q8H PRN, Wouk, Wilfred Curtis, MD, 50 mg at 03/10/23 2213   ketorolac (TORADOL) 15 MG/ML injection 15 mg, 15 mg, Intravenous, Q8H PRN, Wouk, Wilfred Curtis, MD, 15 mg at 03/10/23 2204   lidocaine (PF) (XYLOCAINE) 1 % injection 10 mL, 10 mL, Intradermal, Once, Mugweru, Jon, MD   multivitamin with minerals tablet 1 tablet, 1 tablet, Oral, Daily, Mikey College T, MD, 1 tablet at 03/11/23 1036   nicotine (NICODERM CQ - dosed in mg/24 hours) patch 21 mg, 21 mg, Transdermal, Daily, Mikey College T, MD, 21 mg at 03/11/23 1036   oxyCODONE (Oxy IR/ROXICODONE) immediate release tablet 5 mg, 5 mg, Oral, Q6H PRN, Manuela Schwartz,  NP, 5 mg at 03/11/23 1037   polyethylene glycol (MIRALAX / GLYCOLAX) packet 34 g, 34 g, Oral, Daily, Wouk, Wilfred Curtis, MD, 34 g at 03/09/23 0904   rifaximin (XIFAXAN) tablet 550 mg, 550 mg, Oral, BID, Mikey College T, MD, 550 mg at 03/11/23 1036   spiritus frumenti (ethyl alcohol) solution 1 each, 1 each, Oral, TID, Georgeann Oppenheim, Sudheer B, MD, 1 each at 03/11/23 1037   thiamine (VITAMIN B1) tablet 100 mg, 100 mg, Oral, Daily, 100 mg at 03/11/23 1036 **OR** thiamine (VITAMIN B1) injection 100 mg, 100 mg, Intravenous, Daily, Mikey College T, MD   traZODone (DESYREL) tablet 200 mg, 200 mg, Oral, QHS PRN, Mikey College T,  MD, 200 mg at 03/07/23 2213    ALLERGIES   Patient has no known allergies.     REVIEW OF SYSTEMS    Review of Systems:  Gen:  Denies  fever, sweats, chills weigh loss  HEENT: Denies blurred vision, double vision, ear pain, eye pain, hearing loss, nose bleeds, sore throat Cardiac:  No dizziness, chest pain or heaviness, chest tightness,edema Resp:   reports dyspnea chronically  Gi: Denies swallowing difficulty, stomach pain, nausea or vomiting, diarrhea, constipation, bowel incontinence Gu:  Denies bladder incontinence, burning urine Ext:   Denies Joint pain, stiffness or swelling Skin: Denies  skin rash, easy bruising or bleeding or hives Endoc:  Denies polyuria, polydipsia , polyphagia or weight change Psych:   Denies depression, insomnia or hallucinations   Other:  All other systems negative   VS: BP 101/75 (BP Location: Right Arm)   Pulse 73   Temp 98.4 F (36.9 C) (Oral)   Resp 16   Ht 5\' 1"  (1.549 m)   Wt 47.6 kg   SpO2 100%   BMI 19.83 kg/m      PHYSICAL EXAM    GENERAL:NAD, no fevers, chills, no weakness no fatigue HEAD: Normocephalic, atraumatic.  EYES: Pupils equal, round, reactive to light. Extraocular muscles intact. No scleral icterus.  MOUTH: Moist mucosal membrane. Dentition intact. No abscess noted.  EAR, NOSE, THROAT: Clear without  exudates. No external lesions.  NECK: Supple. No thyromegaly. No nodules. No JVD.  PULMONARY: decreased breath sounds with mild rhonchi worse at bases bilaterally.  CARDIOVASCULAR: S1 and S2. Regular rate and rhythm. No murmurs, rubs, or gallops. No edema. Pedal pulses 2+ bilaterally.  GASTROINTESTINAL: Soft, nontender, nondistended. No masses. Positive bowel sounds. No hepatosplenomegaly.  MUSCULOSKELETAL: No swelling, clubbing, or edema. Range of motion full in all extremities.  NEUROLOGIC: Cranial nerves II through XII are intact. No gross focal neurological deficits. Sensation intact. Reflexes intact.  SKIN: No ulceration, lesions, rashes, or cyanosis. Skin warm and dry. Turgor intact.  PSYCHIATRIC: Mood, affect within normal limits. The patient is awake, alert and oriented x 3. Insight, judgment intact.       IMAGING       Impression  CLINICAL DATA:  Abnormal chest x-ray.  Lung mass.  Weight loss.   EXAM: CT CHEST WITH CONTRAST   TECHNIQUE: Multidetector CT imaging of the chest was performed during intravenous contrast administration.   RADIATION DOSE REDUCTION: This exam was performed according to the departmental dose-optimization program which includes automated exposure control, adjustment of the mA and/or kV according to patient size and/or use of iterative reconstruction technique.   CONTRAST:  75mL OMNIPAQUE IOHEXOL 300 MG/ML  SOLN   COMPARISON:  X-ray earlier 03/05/2023.  CT scan September 2015   FINDINGS: Cardiovascular: Heart is nonenlarged. No pericardial effusion. Normal caliber thoracic aorta.   Mediastinum/Nodes: No specific abnormal lymph node enlargement identified in the axillary regions. No abnormal nodes in the right hilum. There are some small left hilar and mediastinal nodes. Example left infrahilar region measures 15 x 7 mm. Precarinal node measures 8 mm in short axis on series 2, image 64. Normal caliber thoracic esophagus. Preserved thyroid  gland.   Lungs/Pleura: Diffuse breathing motion. Right lung is grossly clear without consolidation, pneumothorax or effusion. There is a 3 mm right lower lobe lung nodule on series 2, image 91. Not clearly seen previously in 2015.   However the left lung has a area of consolidation left lower lobe with some air bronchograms, associated pleural thickening  and a loculated pleural effusion with some air, loculated hydropneumothorax. Overall this measures a proximally 9.3 x 2.6 cm in the axial plane and extends cephalocaudal of up to 19 cm proximally. This extends up to a cavitary lesion in the posterior left upper lobe abutting the pleura with thick irregular walls measuring 5.9 x 4.0 cm. Along the superior aspect of the consolidation left lower lobe is a nodular component extending into the superior segment and slightly crossing the interlobar fissure. This has a nodular component with spiculations on series 2, image 80 measures 2.0 by 1.7 cm.   There are some small nodules elsewhere in the left lung such as series 2, image 60 measuring 5 mm. Other foci on image 66.   Upper Abdomen: Adrenal glands are preserved in the upper abdomen. Stone in the nondilated gallbladder.   Musculoskeletal: Curvature of the thoracic spine with some mild degenerative changes.   IMPRESSION: 5.9 cm cavitary mass in the left upper lobe abutting the pleura with irregular and thickened margins. Associated loculated left hydropneumothorax extending down to the diaphragm with consolidative opacity in the left lower lobe.   Additional nodular component seen adjacent to the consolidation in the superior segment left lower lobe which may cross the pleura.   Overall differential would include atypical infection such as TB versus neoplasm amongst other differential. Recommend additional workup.   Few separate tiny lung nodules elsewhere and some prominent lymph nodes in the mediastinum and left hilum.  Attention on short follow-up.   Gallstones.     Electronically Signed   By: Karen Kays M.D.   On: 03/05/2023 16:54    ASSESSMENT/PLAN   Left hydropneumothorax - patient does have some risk factors for TB including alcoholism and sharing a home with many people who come and go.   -He has smoked his entire life and also has malignancy risk  -he has never had cancer but does have family history including mother with breast cancer and grandmother with breast cancer and uncle with some kind of cancer. - patient would benefit from IR consultation and possible pleural drain.  He may need thoracic surgery evaluation.  -He has lost appx 40 lbs and is currently less then 100lbs with BMI <20.  This may be due to alcoholism vs cancer -He has hyponatremia and reports disequlibrium. 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  -currently he remain in empiric on Zosyn and Vancomycin IV with phramacy consultation -serum fungitell -legionella ab -strep pneumoniae ur AG -Histoplasma Ur Ag -sputum resp cultures -AFB sputum expectorated specimen -sputum cytology  -reviewed pertinent imaging with patient today - ESR/CRP/procalcitonin/MRSA PCR -please encourage patient to use incentive spirometer few times each hour while hospitalized.       Left chest parapneumonic effusion/empyema    - currently there is no bacteria growing in culture   - there is fibrinous debris with pus consistent with empyema   - he is coughing up more mucopurulent material    -he has completed zosyn IV and now on augmentin   - his wbc count is improved   - repeat cxr today   - may need thoracic surgery evaluation       Thank you for allowing me to participate in the care of this patient.   Patient/Family are satisfied with care plan and all questions have been answered.    Provider disclosure: Patient with at least one acute or chronic illness or injury  that  poses a threat to life or bodily function and is being managed actively during this encounter.  All of the below services have been performed independently by signing provider:  review of prior documentation from internal and or external health records.  Review of previous and current lab results.  Interview and comprehensive assessment during patient visit today. Review of current and previous chest radiographs/CT scans. Discussion of management and test interpretation with health care team and patient/family.   This document was prepared using Dragon voice recognition software and may include unintentional dictation errors.     Vida Rigger, M.D.  Division of Pulmonary & Critical Care Medicine

## 2023-03-11 NOTE — Progress Notes (Signed)
   03/11/23 1610  Unsuccessful Nursing Procedure/Treatment  Type of Nursing Procedure/Treatment Peripheral IV insertion  Number of attempts 1  Location of attempt right ac   Attempted to place IV access.  Unable to thread catheter.  IV team consult placed at this time.

## 2023-03-12 DIAGNOSIS — J189 Pneumonia, unspecified organism: Secondary | ICD-10-CM | POA: Diagnosis not present

## 2023-03-12 DIAGNOSIS — J984 Other disorders of lung: Secondary | ICD-10-CM | POA: Diagnosis not present

## 2023-03-12 LAB — CBC
HCT: 25.1 % — ABNORMAL LOW (ref 39.0–52.0)
Hemoglobin: 7.9 g/dL — ABNORMAL LOW (ref 13.0–17.0)
MCH: 28.7 pg (ref 26.0–34.0)
MCHC: 31.5 g/dL (ref 30.0–36.0)
MCV: 91.3 fL (ref 80.0–100.0)
Platelets: 303 10*3/uL (ref 150–400)
RBC: 2.75 MIL/uL — ABNORMAL LOW (ref 4.22–5.81)
RDW: 16.8 % — ABNORMAL HIGH (ref 11.5–15.5)
WBC: 7.5 10*3/uL (ref 4.0–10.5)
nRBC: 0 % (ref 0.0–0.2)

## 2023-03-12 MED ORDER — ORAL CARE MOUTH RINSE: 15.0000 mL | OROMUCOSAL | Status: DC | PRN

## 2023-03-12 NOTE — Plan of Care (Signed)

## 2023-03-12 NOTE — Progress Notes (Signed)
PROGRESS NOTE    DENIM HOAGE  ZOX:[id number] DOB: 1975/10/21 DOA: 03/05/2023 PCP: Leanna Sato, MD    Brief Narrative:  47 y.o. male with medical history significant of alcoholic liver cirrhosis, polio with chronic right leg pain, anxiety/depression, presented with worsening of productive cough, loss of appetite, malaise and weight loss.   Symptoms started 6 months ago, patient started develop productive cough with thick brownish sputum, night> day, associated with loss of appetite and malaise.  He does not see any blood streaks in the sputum.  Denies any fever chills no night sweat.  He lost about 25 pounds from 120 to 95 pounds in 6 months.  Yesterday he went to see PCP who recommended patient come to ED for evaluation.  9/10: Seen in consultation by pulmonary.  Interventional radiology consultation requested.  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      Assessment & Plan:   Principal Problem:   Cavitary pneumonia Active Problems:   Alcoholic cirrhosis of liver with ascites (HCC)   Weight loss   Lobar pneumonia (HCC)   Hydropneumothorax   Protein-calorie malnutrition, severe   Left lung cavity pneumonia Bronchopleural fistula Hydropneumothorax  Infection and malignancy both on differential.  TB remains on differential given background of alcoholism and living in halfway house/group home.  Pulmonary following.  Interventional radiology engaged. Plan: IV Zosyn transitioned to augmentin by pulm Follow-up quant of urine in ED Sputum for AFB neg x2, pcr/cultures pending Airborne isolation Legionella neg Strep pneumo urinary antigen neg Histoplasma urinary antigen Sputum respiratory culture normal growth Stress incentive spirometry use IR chest tube mgmt per pulm, cultures negative to date. Clamping trial today Will discuss bronchopleural fistula w/ CT surg today, awaiting call back  Hypotension Improved, lactate normalized. Stable now off  fluids  Hyponatremia Appears euvolemic.  Possible SIADH secondary to lung lesion Sodium and osms are low, some degree of dehydration probably contributing as sodium has responded to fluids, sodium yesterday 135   Anemia, normocytic Iron deficiency anemia Iron indices low. Hgb stable in the ~8s  Plan: Received IV Venofer 300 mg x 1   Liver cirrhosis -Blood pressure borderline low probably secondary to chronic poor nutrition and weight loss, hold off on Lasix and Aldactone -Continue rifaximin   Alcohol abuse Patient drinks approximately 40 ounces alcohol per day.  Not interested in quitting at this time. No s/s withdrawal. Hcv/hiv neg Plan: Will order patient beer in the hospital  Tobacco abuse Nicotine patch  DVT prophylaxis: SQ Lovenox Code Status: Full Family Communication: None Disposition Plan: Status is: Inpatient Remains inpatient appropriate because: Cavitary lung lesion, chest tube mgmt   Level of care: Progressive  Consultants:  Pulmonary Interventional radiology  Procedures:  IR chest tube 9/12  Antimicrobials: Zosyn>augmentin   Subjective: Seen and examined.  Resting in bed. Breathing stable. Just some soreness from chest tube  Objective: Vitals:   03/12/23 0600 03/12/23 0839 03/12/23 1210 03/12/23 1512  BP: 91/63 112/81 106/77 104/80  Pulse: 79 73 79 79  Resp:  18 20 20   Temp: 97.8 F (36.6 C) 97.7 F (36.5 C) 98.2 F (36.8 C) 98 F (36.7 C)  TempSrc:  Oral Oral Oral  SpO2: 100% 100% 100% 100%  Weight:      Height:        Intake/Output Summary (Last 24 hours) at 03/12/2023 1527 Last data filed at 03/12/2023 1400 Gross per 24 hour  Intake 1780 ml  Output 3080 ml  Net -1300  ml   Filed Weights   03/08/23 1123 03/10/23 0600 03/12/23 0500  Weight: 46.3 kg 47.6 kg 46.8 kg    Examination:  General exam: NAD.  Appears frail and chronically ill Respiratory system: Scattered crackles bilaterally.  Normal work of breathing.  Room  air Cardiovascular system: S1-S2, RRR, no murmurs, no pedal edema Gastrointestinal system: Thin, soft, NT/ND, normal bowel sounds Central nervous system: Alert and oriented. No focal neurological deficits. Extremities: Symmetric 5 x 5 power. Skin: No rashes, lesions or ulcers. Left chest tube in place Psychiatry: Judgement and insight appear normal. Mood & affect appropriate.     Data Reviewed: I have personally reviewed following labs and imaging studies  CBC: Recent Labs  Lab 03/07/23 0516 03/08/23 1421 03/08/23 2321 03/09/23 0649 03/10/23 0738 03/11/23 0308 03/12/23 0448  WBC 9.7 9.6  --  7.0 8.1 7.9 7.5  NEUTROABS 7.1  --   --   --   --   --   --   HGB 9.0* 7.3* 7.6* 8.0* 8.3* 9.4* 7.9*  HCT 28.2* 23.1* 24.0* 25.0* 26.4* 30.6* 25.1*  MCV 89.5 90.9  --  90.3 89.8 91.9 91.3  PLT 326 301  --  284 320 322 303   Basic Metabolic Panel: Recent Labs  Lab 03/07/23 0516 03/08/23 0433 03/09/23 0649 03/10/23 0738 03/10/23 0749 03/11/23 0308  NA 131* 134* 132*  --  132* 135  K 3.9 4.0 4.1  --  4.2 4.5  CL 101 101 100  --  102 103  CO2 25 24 24   --  23 26  GLUCOSE 87 100* 143*  --  94 85  BUN <5* 6 8  --  9 10  CREATININE 0.42* 0.53* 0.49*  --  0.48* 0.56*  CALCIUM 8.1* 8.2* 8.0*  --  7.9* 8.5*  MG  --   --   --  1.9  --   --   PHOS  --   --   --  3.4  --   --    GFR: Estimated Creatinine Clearance: 73.9 mL/min (A) (by C-G formula based on SCr of 0.56 mg/dL (L)). Liver Function Tests: Recent Labs  Lab 03/09/23 0649  AST 27  ALT 19  ALKPHOS 106  BILITOT 0.5  PROT 5.3*  ALBUMIN 1.8*   No results for input(s): "LIPASE", "AMYLASE" in the last 168 hours.  No results for input(s): "AMMONIA" in the last 168 hours. Coagulation Profile: Recent Labs  Lab 03/07/23 0516 03/09/23 0649  INR 1.3* 1.1   Cardiac Enzymes: No results for input(s): "CKTOTAL", "CKMB", "CKMBINDEX", "TROPONINI" in the last 168 hours. BNP (last 3 results) No results for input(s): "PROBNP" in  the last 8760 hours. HbA1C: No results for input(s): "HGBA1C" in the last 72 hours. CBG: No results for input(s): "GLUCAP" in the last 168 hours. Lipid Profile: No results for input(s): "CHOL", "HDL", "LDLCALC", "TRIG", "CHOLHDL", "LDLDIRECT" in the last 72 hours. Thyroid Function Tests: No results for input(s): "TSH", "T4TOTAL", "FREET4", "T3FREE", "THYROIDAB" in the last 72 hours. Anemia Panel: No results for input(s): "VITAMINB12", "FOLATE", "FERRITIN", "TIBC", "IRON", "RETICCTPCT" in the last 72 hours.  Sepsis Labs: Recent Labs  Lab 03/08/23 0433 03/08/23 1425 03/09/23 0649 03/10/23 0738 03/10/23 0817 03/11/23 0308  PROCALCITON 0.12  --  0.10 0.10  --  0.13  LATICACIDVEN  --  2.4* 2.4*  --  1.2  --     Recent Results (from the past 240 hour(s))  Blood Culture (routine x 2)  Status: None   Collection Time: 03/05/23  2:40 PM   Specimen: BLOOD  Result Value Ref Range Status   Specimen Description BLOOD BLOOD RIGHT ARM  Final   Special Requests   Final    BOTTLES DRAWN AEROBIC AND ANAEROBIC Blood Culture adequate volume   Culture   Final    NO GROWTH 5 DAYS Performed at [facility], [address] [place], [place] [address]    Report Status 03/10/2023 FINAL  Final  Blood Culture (routine x 2)     Status: None   Collection Time: 03/05/23  2:45 PM   Specimen: BLOOD  Result Value Ref Range Status   Specimen Description BLOOD LEFT ANTECUBITAL  Final   Special Requests   Final    BOTTLES DRAWN AEROBIC AND ANAEROBIC Blood Culture adequate volume   Culture   Final    NO GROWTH 5 DAYS Performed at [facility], [address], [place], [place] [address]    Report Status 03/10/2023 FINAL  Final  MRSA Next Gen by PCR, Nasal     Status: None   Collection Time: 03/06/23  8:09 AM   Specimen: Nasal Mucosa; Nasal Swab  Result Value Ref Range Status   MRSA by PCR Next Gen NOT DETECTED NOT DETECTED Final    Comment: (NOTE) The GeneXpert MRSA Assay (FDA  approved for NASAL specimens only), is one component of a comprehensive MRSA colonization surveillance program. It is not intended to diagnose MRSA infection nor to guide or monitor treatment for MRSA infections. Test performance is not FDA approved in patients less than 42 years old. Performed at [facility], [address] [place], [place] [address]   Expectorated Sputum Assessment w Gram Stain, Rflx to Resp Cult     Status: None   Collection Time: 03/06/23  1:50 PM   Specimen: Sputum  Result Value Ref Range Status   Specimen Description SPUTUM  Final   Special Requests NONE  Final   Sputum evaluation   Final    THIS SPECIMEN IS ACCEPTABLE FOR SPUTUM CULTURE Performed at [facility], [address] [place], [place] [address]    Report Status 03/06/2023 FINAL  Final  Acid Fast Smear (AFB)     Status: None   Collection Time: 03/06/23  1:50 PM   Specimen: Sputum  Result Value Ref Range Status   AFB Specimen Processing Concentration  Final   Acid Fast Smear Negative  Final    Comment: (NOTE) Performed At: [facility] [address] [place], [place] [id number] Jolene Schimke MD MV:[id number]    Source (AFB) SPUTUM  Final    Comment: Performed at [facility], [address] [place], [place] [address]  Culture, Respiratory w Gram Stain     Status: None   Collection Time: 03/06/23  1:50 PM   Specimen: SPU  Result Value Ref Range Status   Specimen Description   Final    SPUTUM Performed at [facility], [address] [place], [place] [address]    Special Requests   Final    NONE Reflexed from [id number] Performed at [facility], [address] [place], [place] [address]    Gram Stain   Final    MODERATE WBC PRESENT,BOTH PMN AND MONONUCLEAR RARE GRAM NEGATIVE RODS    Culture   Final    Normal respiratory flora-no Staph aureus or Pseudomonas seen Performed at [facility], [address], [place],  [place] [address]  Report Status 03/09/2023 FINAL  Final  Culture, Respiratory w Gram Stain     Status: None   Collection Time: 03/06/23  6:30 PM   Specimen: Sputum; Respiratory  Result Value Ref Range Status   Specimen Description   Final    SPU Performed at [facility], [address] [place], [place] [address]    Special Requests   Final    NONE Performed at [facility], [address] [place], [place] [address]    Gram Stain   Final    ABUNDANT WBC PRESENT, PREDOMINANTLY PMN NO ORGANISMS SEEN    Culture   Final    Normal respiratory flora-no Staph aureus or Pseudomonas seen Performed at [facility], [address] [place], [place] [address]    Report Status 03/10/2023 FINAL  Final  Acid Fast Smear (AFB)     Status: None   Collection Time: 03/06/23  6:30 PM   Specimen: Sputum  Result Value Ref Range Status   AFB Specimen Processing Concentration  Final   Acid Fast Smear Negative  Final    Comment: (NOTE) Performed At: [facility] [address] [place], [place] [id number] Jolene Schimke MD QQ:[id number]    Source (AFB) SPUTUM  Final    Comment: Performed at [facility], [address] [place], [place] [address]  MTB-RIF NAA with AFB Culture, sputum (q8 x 3)     Status: None (Preliminary result)   Collection Time: 03/08/23  6:35 AM   Specimen: Sputum  Result Value Ref Range Status   Myco tuberculosis Complex Comment NOT DETECTED Final    Comment: Mycobacterium tuberculosis complex (MTBC) NOT detected.   Rifampin Comment NOT DETECTED Final    Comment: (NOTE) Because Mycobacterium tuberculosis complex (MTBC) was not detected, no rifampin determination is possible.    AFB Specimen Processing Concentration  Final    Comment: (NOTE) Performed At: [facility] [address] [place], [place] [id number] Jolene Schimke MD [id number]    Acid Fast Culture PENDING  Incomplete   Source (MTB RIF) EXPECTORATED SPUTUM   Final    Comment: Performed at [facility], [address] [place], [place] [address]  Aerobic/Anaerobic Culture w Gram Stain (surgical/deep wound)     Status: None (Preliminary result)   Collection Time: 03/08/23 12:58 PM   Specimen: Abscess  Result Value Ref Range Status   Specimen Description   Final    ABSCESS Performed at [facility], [address] [place], [place] [address]    Special Requests   Final    LEFT CHEST EMPYEMA Performed at [facility], [address] [place], [place] [address]    Gram Stain   Final    ABUNDANT WBC PRESENT,BOTH PMN AND MONONUCLEAR NO ORGANISMS SEEN    Culture   Final    NO GROWTH 4 DAYS NO ANAEROBES ISOLATED; CULTURE IN PROGRESS FOR 5 DAYS Performed at [facility], [address] [place], [place] [address]    Report Status PENDING  Incomplete  MTB-RIF NAA with AFB Culture, sputum (q8 x 3)     Status: None (Preliminary result)   Collection Time: 03/08/23  1:15 PM   Specimen: Sputum  Result Value Ref Range Status   Myco tuberculosis Complex PENDING  Incomplete   Rifampin PENDING  Incomplete   AFB Specimen Processing Concentration  Final    Comment: (NOTE) Performed At: [facility] [address] [place], [place] [id number] Jolene Schimke MD PX:[id number]  Acid Fast Culture PENDING  Incomplete   Source (MTB RIF) SPUTUM  Final    Comment: Performed at [facility], [address], [place], [place] [address]         Radiology Studies: DG Chest Port 1 View  Result Date: 03/11/2023 CLINICAL DATA:  Pleural effusion EXAM: PORTABLE CHEST 1 VIEW COMPARISON:  03/09/2023 FINDINGS: Unremarkable cardiac silhouette. Left base consolidation or volume loss and left-sided small pleural effusion with basilar chest tube in place. No pneumothorax. Right lung clear. Normal pulmonary vasculature. IMPRESSION: Small residual left-sided pleural effusion with a chest tube in place. No pneumothorax.  Electronically Signed   By: Layla Maw M.D.   On: 03/11/2023 11:58        Scheduled Meds:  amoxicillin-clavulanate  1 tablet Oral Q12H   benzonatate  200 mg Oral BID   enoxaparin (LOVENOX) injection  40 mg Subcutaneous Q24H   ferrous sulfate  325 mg Oral BID WC   folic acid  1 mg Oral Daily   gabapentin  300 mg Oral TID   guaiFENesin  1,200 mg Oral BID   lidocaine (PF)  10 mL Intradermal Once   multivitamin with minerals  1 tablet Oral Daily   nicotine  21 mg Transdermal Daily   polyethylene glycol  34 g Oral Daily   rifaximin  550 mg Oral BID   spiritus frumenti  1 each Oral TID   thiamine  100 mg Oral Daily   Or   thiamine  100 mg Intravenous Daily   Continuous Infusions:  sodium chloride Stopped (03/07/23 1818)     LOS: 7 days     Silvano Bilis, MD [facility]   If 7PM-7AM, please contact night-coverage  03/12/2023, 3:27 PM

## 2023-03-12 NOTE — Progress Notes (Signed)
PULMONOLOGY         Date: 03/12/2023,   MRN# [id number] Keith Parker 11-23-1975     AdmissionWeight: 46.3 kg                 CurrentWeight: 46.8 kg  Referring provider: Dr Mikey College   CHIEF COMPLAINT:   Hydropneumothorax   HISTORY OF PRESENT ILLNESS   Patient reports exposure to black mold "very heavy" appx 1-2 years ago.  He has lived in this house with mold for appx 3 years.  He smokes actively, he smokes tobacco and THC.  He denies any other substances but admits to drinking alcohol.  He drinks 40Oz. Of beer daily.    03/07/23- patient is able to eat and is breathing on room air. Reports cough is worse. He has IR consult for chest tube but shares procedure was postponed.  He is on antibiotics with zosyn and vanco.  MRSA is negative. Sputum culture is negative and procalcitonin is flat both indicative of possible cancer as main etiology.   03/08/23- patient for chest tube today. CRP remains elevated and he may still require throacic surgery evaluation   03/09/23- patient had no overnigth events.  He feels better.  Chest tube management with + air leak on forced expiratory maneuver.  He slept well after fluid has been aspirated.  03/10/23- patient reports less pain at left lung.  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  We have requested thoracic surgery eval routine consultation. Patient stable. No signs of alcohol withdrawal.    03/11/23- patient reports mild pain at left axilary lower lung zone. Repeat cxr in process today. Bloodwork is stable.  Infectious workup is all negative thus far.  Air leak still present but actually reduced.  Will continue current antimicrobials empirically  03/12/23- patient continues to improve.  CXR 03/11/23 with only minimal residual effusion. Thus far no evidence of active infection.  WBC normal since admission , procalcitonin is also normal and cultures are negative including respiratory sputum and pleural fluid from initial  aspiration of left thorax. Multiple AFB specimens are negative to date. He is ambulatory on room air. There is concern for neoplasm related lesion.  Today we will clamp tube and check for re-accumulation of fluid/air with repeat chest imaging in am.  IF all stable may remove  chest tube and prepare for dc home.     PAST MEDICAL HISTORY   Past Medical History:  Diagnosis Date   Allergy    Arthritis    Depression    Elevated liver enzymes 10/29/2017   GERD (gastroesophageal reflux disease)    Hearing loss, conductive, bilateral 12/31/2017   Hypertension    Polio    Seizures (HCC)    last seizure about 7 years ago.    Substance abuse (HCC)    Alcohol abuse     SURGICAL HISTORY   Past Surgical History:  Procedure Laterality Date   COLONOSCOPY N/A 06/15/2022   Procedure: COLONOSCOPY;  Surgeon: Wyline Mood, MD;  Location: [facility] ENDOSCOPY;  Service: Gastroenterology;  Laterality: N/A;   COLONOSCOPY WITH PROPOFOL N/A 07/13/2021   Procedure: COLONOSCOPY WITH PROPOFOL;  Surgeon: Wyline Mood, MD;  Location: [facility] ENDOSCOPY;  Service: Gastroenterology;  Laterality: N/A;   ESOPHAGOGASTRODUODENOSCOPY N/A 07/13/2021   Procedure: ESOPHAGOGASTRODUODENOSCOPY (EGD);  Surgeon: Wyline Mood, MD;  Location: [facility] ENDOSCOPY;  Service: Gastroenterology;  Laterality: N/A;   ESOPHAGOGASTRODUODENOSCOPY (EGD) WITH PROPOFOL N/A 10/13/2021   Procedure: ESOPHAGOGASTRODUODENOSCOPY (EGD) WITH PROPOFOL;  Surgeon: Tobi Bastos,  Sharlet Salina, MD;  Location: [facility] ENDOSCOPY;  Service: Gastroenterology;  Laterality: N/A;   ESOPHAGOGASTRODUODENOSCOPY (EGD) WITH PROPOFOL N/A 11/08/2021   Procedure: ESOPHAGOGASTRODUODENOSCOPY (EGD) WITH PROPOFOL;  Surgeon: Wyline Mood, MD;  Location: [facility] ENDOSCOPY;  Service: Gastroenterology;  Laterality: N/A;   ESOPHAGOGASTRODUODENOSCOPY (EGD) WITH PROPOFOL N/A 06/15/2022   Procedure: ESOPHAGOGASTRODUODENOSCOPY (EGD) WITH PROPOFOL;  Surgeon: Wyline Mood, MD;  Location: [facility] ENDOSCOPY;  Service:  Gastroenterology;  Laterality: N/A;   LEG SURGERY  1988   8 surgeries-hips and legs for walking after getting polio- last one 1988   WRIST SURGERY     right     FAMILY HISTORY   Family History  Problem Relation Age of Onset   Cancer Mother        Breast   Arthritis Mother    Alcohol abuse Father    Arthritis Father    Hypertension Father    Cancer Maternal Grandmother        Breast   Hypertension Maternal Grandmother    Hypertension Maternal Grandfather    Hypertension Paternal Grandmother    Hypertension Paternal Grandfather    Drug abuse Maternal Uncle      SOCIAL HISTORY   Social History   Tobacco Use   Smoking status: Every Day    Current packs/day: 2.00    Average packs/day: 2.0 packs/day for 25.0 years (50.0 ttl pk-yrs)    Types: Cigarettes   Smokeless tobacco: Never  Vaping Use   Vaping status: Never Used  Substance Use Topics   Alcohol use: Yes    Alcohol/week: 70.0 standard drinks of alcohol    Types: 70 Cans of beer per week    Comment: 2 days ago   Drug use: No     MEDICATIONS    Home Medication:    Current Medication:  Current Facility-Administered Medications:    0.9 %  sodium chloride infusion, , Intravenous, PRN, Tresa Moore, MD, Stopped at 03/07/23 1818   acetaminophen (TYLENOL) tablet 500 mg, 500 mg, Oral, Q6H PRN, Wouk, Wilfred Curtis, MD   amoxicillin-clavulanate (AUGMENTIN) 875-125 MG per tablet 1 tablet, 1 tablet, Oral, Q12H, Vida Rigger, MD, 1 tablet at 03/11/23 2144   benzonatate (TESSALON) capsule 200 mg, 200 mg, Oral, BID, Mikey College T, MD, 200 mg at 03/11/23 2144   cyclobenzaprine (FLEXERIL) tablet 7.5 mg, 7.5 mg, Oral, TID PRN, Mikey College T, MD, 7.5 mg at 03/12/23 0324   enoxaparin (LOVENOX) injection 40 mg, 40 mg, Subcutaneous, Q24H, Mikey College T, MD, 40 mg at 03/11/23 0737   ferrous sulfate tablet 325 mg, 325 mg, Oral, BID WC, Sreenath, Sudheer B, MD, 325 mg at 03/11/23 1638   folic acid (FOLVITE) tablet 1 mg, 1  mg, Oral, Daily, Chipper Herb, Ping T, MD, 1 mg at 03/11/23 1036   gabapentin (NEURONTIN) capsule 300 mg, 300 mg, Oral, TID, Mikey College T, MD, 300 mg at 03/11/23 2144   guaiFENesin (MUCINEX) 12 hr tablet 1,200 mg, 1,200 mg, Oral, BID, Mikey College T, MD, 1,200 mg at 03/11/23 2144   hydrOXYzine (ATARAX) tablet 50 mg, 50 mg, Oral, Q8H PRN, Wouk, Wilfred Curtis, MD, 50 mg at 03/12/23 0109   ketorolac (TORADOL) 15 MG/ML injection 15 mg, 15 mg, Intravenous, Q8H PRN, Wouk, Wilfred Curtis, MD, 15 mg at 03/12/23 0331   lidocaine (PF) (XYLOCAINE) 1 % injection 10 mL, 10 mL, Intradermal, Once, Mugweru, Jon, MD   multivitamin with minerals tablet 1 tablet, 1 tablet, Oral, Daily, Mikey College T, MD, 1 tablet at 03/11/23 1036  nicotine (NICODERM CQ - dosed in mg/24 hours) patch 21 mg, 21 mg, Transdermal, Daily, Mikey College T, MD, 21 mg at 03/11/23 1036   oxyCODONE (Oxy IR/ROXICODONE) immediate release tablet 5 mg, 5 mg, Oral, Q6H PRN, Manuela Schwartz, NP, 5 mg at 03/12/23 0440   polyethylene glycol (MIRALAX / GLYCOLAX) packet 34 g, 34 g, Oral, Daily, Wouk, Wilfred Curtis, MD, 34 g at 03/09/23 4098   rifaximin (XIFAXAN) tablet 550 mg, 550 mg, Oral, BID, Mikey College T, MD, 550 mg at 03/11/23 2148   spiritus frumenti (ethyl alcohol) solution 1 each, 1 each, Oral, TID, Lolita Patella B, MD, 1 each at 03/11/23 2148   thiamine (VITAMIN B1) tablet 100 mg, 100 mg, Oral, Daily, 100 mg at 03/11/23 1036 **OR** thiamine (VITAMIN B1) injection 100 mg, 100 mg, Intravenous, Daily, Mikey College T, MD   traZODone (DESYREL) tablet 200 mg, 200 mg, Oral, QHS PRN, Mikey College T, MD, 200 mg at 03/07/23 2213    ALLERGIES   Patient has no known allergies.     REVIEW OF SYSTEMS    Review of Systems:  Gen:  Denies  fever, sweats, chills weigh loss  HEENT: Denies blurred vision, double vision, ear pain, eye pain, hearing loss, nose bleeds, sore throat Cardiac:  No dizziness, chest pain or heaviness, chest tightness,edema Resp:    reports dyspnea chronically  Gi: Denies swallowing difficulty, stomach pain, nausea or vomiting, diarrhea, constipation, bowel incontinence Gu:  Denies bladder incontinence, burning urine Ext:   Denies Joint pain, stiffness or swelling Skin: Denies  skin rash, easy bruising or bleeding or hives Endoc:  Denies polyuria, polydipsia , polyphagia or weight change Psych:   Denies depression, insomnia or hallucinations   Other:  All other systems negative   VS: BP 91/63   Pulse 79   Temp 97.8 F (36.6 C)   Resp 18   Ht 5\' 1"  (1.549 m)   Wt 46.8 kg   SpO2 100%   BMI 19.50 kg/m      PHYSICAL EXAM    GENERAL:NAD, no fevers, chills, no weakness no fatigue HEAD: Normocephalic, atraumatic.  EYES: Pupils equal, round, reactive to light. Extraocular muscles intact. No scleral icterus.  MOUTH: Moist mucosal membrane. Dentition intact. No abscess noted.  EAR, NOSE, THROAT: Clear without exudates. No external lesions.  NECK: Supple. No thyromegaly. No nodules. No JVD.  PULMONARY: decreased breath sounds with mild rhonchi worse at bases bilaterally.  CARDIOVASCULAR: S1 and S2. Regular rate and rhythm. No murmurs, rubs, or gallops. No edema. Pedal pulses 2+ bilaterally.  GASTROINTESTINAL: Soft, nontender, nondistended. No masses. Positive bowel sounds. No hepatosplenomegaly.  MUSCULOSKELETAL: No swelling, clubbing, or edema. Range of motion full in all extremities.  NEUROLOGIC: Cranial nerves II through XII are intact. No gross focal neurological deficits. Sensation intact. Reflexes intact.  SKIN: No ulceration, lesions, rashes, or cyanosis. Skin warm and dry. Turgor intact.  PSYCHIATRIC: Mood, affect within normal limits. The patient is awake, alert and oriented x 3. Insight, judgment intact.       IMAGING       Impression  CLINICAL DATA:  Abnormal chest x-ray.  Lung mass.  Weight loss.   EXAM: CT CHEST WITH CONTRAST   TECHNIQUE: Multidetector CT imaging of the chest was  performed during intravenous contrast administration.   RADIATION DOSE REDUCTION: This exam was performed according to the departmental dose-optimization program which includes automated exposure control, adjustment of the mA and/or kV according to patient size and/or use of iterative reconstruction technique.  CONTRAST:  75mL OMNIPAQUE IOHEXOL 300 MG/ML  SOLN   COMPARISON:  X-ray earlier 03/05/2023.  CT scan September 2015   FINDINGS: Cardiovascular: Heart is nonenlarged. No pericardial effusion. Normal caliber thoracic aorta.   Mediastinum/Nodes: No specific abnormal lymph node enlargement identified in the axillary regions. No abnormal nodes in the right hilum. There are some small left hilar and mediastinal nodes. Example left infrahilar region measures 15 x 7 mm. Precarinal node measures 8 mm in short axis on series 2, image 64. Normal caliber thoracic esophagus. Preserved thyroid gland.   Lungs/Pleura: Diffuse breathing motion. Right lung is grossly clear without consolidation, pneumothorax or effusion. There is a 3 mm right lower lobe lung nodule on series 2, image 91. Not clearly seen previously in 2015.   However the left lung has a area of consolidation left lower lobe with some air bronchograms, associated pleural thickening and a loculated pleural effusion with some air, loculated hydropneumothorax. Overall this measures a proximally 9.3 x 2.6 cm in the axial plane and extends cephalocaudal of up to 19 cm proximally. This extends up to a cavitary lesion in the posterior left upper lobe abutting the pleura with thick irregular walls measuring 5.9 x 4.0 cm. Along the superior aspect of the consolidation left lower lobe is a nodular component extending into the superior segment and slightly crossing the interlobar fissure. This has a nodular component with spiculations on series 2, image 80 measures 2.0 by 1.7 cm.   There are some small nodules elsewhere in the left  lung such as series 2, image 60 measuring 5 mm. Other foci on image 66.   Upper Abdomen: Adrenal glands are preserved in the upper abdomen. Stone in the nondilated gallbladder.   Musculoskeletal: Curvature of the thoracic spine with some mild degenerative changes.   IMPRESSION: 5.9 cm cavitary mass in the left upper lobe abutting the pleura with irregular and thickened margins. Associated loculated left hydropneumothorax extending down to the diaphragm with consolidative opacity in the left lower lobe.   Additional nodular component seen adjacent to the consolidation in the superior segment left lower lobe which may cross the pleura.   Overall differential would include atypical infection such as TB versus neoplasm amongst other differential. Recommend additional workup.   Few separate tiny lung nodules elsewhere and some prominent lymph nodes in the mediastinum and left hilum. Attention on short follow-up.   Gallstones.     Electronically Signed   By: Karen Kays M.D.   On: 03/05/2023 16:54    ASSESSMENT/PLAN   Left hydropneumothorax - patient does have some risk factors for TB including alcoholism and sharing a home with many people who come and go.   -He has smoked his entire life and also has malignancy risk  -he has never had cancer but does have family history including mother with breast cancer and grandmother with breast cancer and uncle with some kind of cancer. - patient would benefit from IR consultation and possible pleural drain.  He may need thoracic surgery evaluation.  -He has lost appx 40 lbs and is currently less then 100lbs with BMI <20.  This may be due to alcoholism vs cancer -He has hyponatremia and reports disequlibrium. 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  -currently he remain in empiric on Zosyn and Vancomycin IV with phramacy consultation -serum fungitell -legionella ab -strep  pneumoniae ur AG -Histoplasma Ur Ag -sputum resp cultures -AFB sputum expectorated specimen -sputum  cytology  -reviewed pertinent imaging with patient today - ESR/CRP/procalcitonin/MRSA PCR -please encourage patient to use incentive spirometer few times each hour while hospitalized.       Left chest parapneumonic effusion/empyema    - currently there is no bacteria growing in culture   - there is fibrinous debris with pus consistent with empyema   - he is coughing up more mucopurulent material    -he has completed zosyn IV and now on augmentin   - his wbc count is improved   - repeat cxr today   - may need thoracic surgery evaluation       Thank you for allowing me to participate in the care of this patient.   Patient/Family are satisfied with care plan and all questions have been answered.    Provider disclosure: Patient with at least one acute or chronic illness or injury that poses a threat to life or bodily function and is being managed actively during this encounter.  All of the below services have been performed independently by signing provider:  review of prior documentation from internal and or external health records.  Review of previous and current lab results.  Interview and comprehensive assessment during patient visit today. Review of current and previous chest radiographs/CT scans. Discussion of management and test interpretation with health care team and patient/family.   This document was prepared using Dragon voice recognition software and may include unintentional dictation errors.     Vida Rigger, M.D.  Division of Pulmonary & Critical Care Medicine

## 2023-03-13 ENCOUNTER — Inpatient Hospital Stay: Payer: Medicare Other

## 2023-03-13 ENCOUNTER — Inpatient Hospital Stay: Payer: Medicare Other | Admitting: Radiology

## 2023-03-13 DIAGNOSIS — J984 Other disorders of lung: Secondary | ICD-10-CM | POA: Diagnosis not present

## 2023-03-13 DIAGNOSIS — J189 Pneumonia, unspecified organism: Secondary | ICD-10-CM | POA: Diagnosis not present

## 2023-03-13 HISTORY — PX: IR RADIOLOGIST EVAL & MGMT: IMG5224

## 2023-03-13 LAB — BASIC METABOLIC PANEL
Anion gap: 9 (ref 5–15)
BUN: 11 mg/dL (ref 6–20)
CO2: 25 mmol/L (ref 22–32)
Calcium: 8.7 mg/dL — ABNORMAL LOW (ref 8.9–10.3)
Chloride: 100 mmol/L (ref 98–111)
Creatinine, Ser: 0.61 mg/dL (ref 0.61–1.24)
GFR, Estimated: >60 mL/min (ref >60)
Glucose, Bld: 92 mg/dL (ref 70–99)
Potassium: 4.2 mmol/L (ref 3.5–5.1)
Sodium: 134 mmol/L — ABNORMAL LOW (ref 135–145)

## 2023-03-13 LAB — AEROBIC/ANAEROBIC CULTURE W GRAM STAIN (SURGICAL/DEEP WOUND): Culture: NO GROWTH

## 2023-03-13 MED ORDER — ENSURE ENLIVE PO LIQD: 237.0000 mL | Freq: Two times a day (BID) | ORAL | Status: DC

## 2023-03-13 NOTE — Progress Notes (Signed)
PULMONOLOGY         Date: 03/13/2023,   MRN# [id number] Keith Parker 05/03/1976     AdmissionWeight: 46.3 kg                 CurrentWeight: 47.4 kg  Referring provider: Dr Mikey College   CHIEF COMPLAINT:   Hydropneumothorax   HISTORY OF PRESENT ILLNESS   Patient reports exposure to black mold "very heavy" appx 1-2 years ago.  He has lived in this house with mold for appx 3 years.  He smokes actively, he smokes tobacco and THC.  He denies any other substances but admits to drinking alcohol.  He drinks 40Oz. Of beer daily.    03/07/23- patient is able to eat and is breathing on room air. Reports cough is worse. He has IR consult for chest tube but shares procedure was postponed.  He is on antibiotics with zosyn and vanco.  MRSA is negative. Sputum culture is negative and procalcitonin is flat both indicative of possible cancer as main etiology.   03/08/23- patient for chest tube today. CRP remains elevated and he may still require throacic surgery evaluation   03/09/23- patient had no overnigth events.  He feels better.  Chest tube management with + air leak on forced expiratory maneuver.  He slept well after fluid has been aspirated.  03/10/23- patient reports less pain at left lung.  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  We have requested thoracic surgery eval routine consultation. Patient stable. No signs of alcohol withdrawal.    03/11/23- patient reports mild pain at left axilary lower lung zone. Repeat cxr in process today. Bloodwork is stable.  Infectious workup is all negative thus far.  Air leak still present but actually reduced.  Will continue current antimicrobials empirically  03/12/23- patient continues to improve.  CXR 03/11/23 with only minimal residual effusion. Thus far no evidence of active infection.  WBC normal since admission , procalcitonin is also normal and cultures are negative including respiratory sputum and pleural fluid from initial  aspiration of left thorax. Multiple AFB specimens are negative to date. He is ambulatory on room air. There is concern for neoplasm related lesion.  Today we will clamp tube and check for re-accumulation of fluid/air with repeat chest imaging in am.  IF all stable may remove  chest tube and prepare for dc home.    03/13/23- patient is feeling well on room air.  He feels close to baseline.  He had clamped chest tube overnight. Will ask IR consultatnt to remove pigtail if possible today to optmize for dc planning.    PAST MEDICAL HISTORY   Past Medical History:  Diagnosis Date   Allergy    Arthritis    Depression    Elevated liver enzymes 10/29/2017   GERD (gastroesophageal reflux disease)    Hearing loss, conductive, bilateral 12/31/2017   Hypertension    Polio    Seizures (HCC)    last seizure about 7 years ago.    Substance abuse (HCC)    Alcohol abuse     SURGICAL HISTORY   Past Surgical History:  Procedure Laterality Date   COLONOSCOPY N/A 06/15/2022   Procedure: COLONOSCOPY;  Surgeon: Wyline Mood, MD;  Location: [facility] ENDOSCOPY;  Service: Gastroenterology;  Laterality: N/A;   COLONOSCOPY WITH PROPOFOL N/A 07/13/2021   Procedure: COLONOSCOPY WITH PROPOFOL;  Surgeon: Wyline Mood, MD;  Location: [facility] ENDOSCOPY;  Service: Gastroenterology;  Laterality: N/A;   ESOPHAGOGASTRODUODENOSCOPY N/A 07/13/2021  Procedure: ESOPHAGOGASTRODUODENOSCOPY (EGD);  Surgeon: Wyline Mood, MD;  Location: [facility] ENDOSCOPY;  Service: Gastroenterology;  Laterality: N/A;   ESOPHAGOGASTRODUODENOSCOPY (EGD) WITH PROPOFOL N/A 10/13/2021   Procedure: ESOPHAGOGASTRODUODENOSCOPY (EGD) WITH PROPOFOL;  Surgeon: Wyline Mood, MD;  Location: [facility] ENDOSCOPY;  Service: Gastroenterology;  Laterality: N/A;   ESOPHAGOGASTRODUODENOSCOPY (EGD) WITH PROPOFOL N/A 11/08/2021   Procedure: ESOPHAGOGASTRODUODENOSCOPY (EGD) WITH PROPOFOL;  Surgeon: Wyline Mood, MD;  Location: [facility] ENDOSCOPY;  Service: Gastroenterology;  Laterality: N/A;    ESOPHAGOGASTRODUODENOSCOPY (EGD) WITH PROPOFOL N/A 06/15/2022   Procedure: ESOPHAGOGASTRODUODENOSCOPY (EGD) WITH PROPOFOL;  Surgeon: Wyline Mood, MD;  Location: [facility] ENDOSCOPY;  Service: Gastroenterology;  Laterality: N/A;   LEG SURGERY  1988   8 surgeries-hips and legs for walking after getting polio- last one 1988   WRIST SURGERY     right     FAMILY HISTORY   Family History  Problem Relation Age of Onset   Cancer Mother        Breast   Arthritis Mother    Alcohol abuse Father    Arthritis Father    Hypertension Father    Cancer Maternal Grandmother        Breast   Hypertension Maternal Grandmother    Hypertension Maternal Grandfather    Hypertension Paternal Grandmother    Hypertension Paternal Grandfather    Drug abuse Maternal Uncle      SOCIAL HISTORY   Social History   Tobacco Use   Smoking status: Every Day    Current packs/day: 2.00    Average packs/day: 2.0 packs/day for 25.0 years (50.0 ttl pk-yrs)    Types: Cigarettes   Smokeless tobacco: Never  Vaping Use   Vaping status: Never Used  Substance Use Topics   Alcohol use: Yes    Alcohol/week: 70.0 standard drinks of alcohol    Types: 70 Cans of beer per week    Comment: 2 days ago   Drug use: No     MEDICATIONS    Home Medication:    Current Medication:  Current Facility-Administered Medications:    0.9 %  sodium chloride infusion, , Intravenous, PRN, Tresa Moore, MD, Stopped at 03/07/23 1818   acetaminophen (TYLENOL) tablet 500 mg, 500 mg, Oral, Q6H PRN, Wouk, Wilfred Curtis, MD   amoxicillin-clavulanate (AUGMENTIN) 875-125 MG per tablet 1 tablet, 1 tablet, Oral, Q12H, Vida Rigger, MD, 1 tablet at 03/13/23 4403   benzonatate (TESSALON) capsule 200 mg, 200 mg, Oral, BID, Mikey College T, MD, 200 mg at 03/13/23 4742   cyclobenzaprine (FLEXERIL) tablet 7.5 mg, 7.5 mg, Oral, TID PRN, Mikey College T, MD, 7.5 mg at 03/12/23 1021   enoxaparin (LOVENOX) injection 40 mg, 40 mg, Subcutaneous,  Q24H, Mikey College T, MD, 40 mg at 03/13/23 5956   ferrous sulfate tablet 325 mg, 325 mg, Oral, BID WC, Sreenath, Sudheer B, MD, 325 mg at 03/13/23 3875   folic acid (FOLVITE) tablet 1 mg, 1 mg, Oral, Daily, Mikey College T, MD, 1 mg at 03/13/23 6433   gabapentin (NEURONTIN) capsule 300 mg, 300 mg, Oral, TID, Mikey College T, MD, 300 mg at 03/13/23 0852   guaiFENesin (MUCINEX) 12 hr tablet 1,200 mg, 1,200 mg, Oral, BID, Mikey College T, MD, 1,200 mg at 03/13/23 0852   hydrOXYzine (ATARAX) tablet 50 mg, 50 mg, Oral, Q8H PRN, Wouk, Wilfred Curtis, MD, 50 mg at 03/12/23 1828   ketorolac (TORADOL) 15 MG/ML injection 15 mg, 15 mg, Intravenous, Q8H PRN, Wouk, Wilfred Curtis, MD, 15 mg at 03/12/23 1422   lidocaine (PF) (  XYLOCAINE) 1 % injection 10 mL, 10 mL, Intradermal, Once, Mugweru, Jon, MD   multivitamin with minerals tablet 1 tablet, 1 tablet, Oral, Daily, Mikey College T, MD, 1 tablet at 03/13/23 6578   nicotine (NICODERM CQ - dosed in mg/24 hours) patch 21 mg, 21 mg, Transdermal, Daily, Mikey College T, MD, 21 mg at 03/13/23 0854   Oral care mouth rinse, 15 mL, Mouth Rinse, PRN, Wouk, Wilfred Curtis, MD   oxyCODONE (Oxy IR/ROXICODONE) immediate release tablet 5 mg, 5 mg, Oral, Q6H PRN, Manuela Schwartz, NP, 5 mg at 03/13/23 1108   polyethylene glycol (MIRALAX / GLYCOLAX) packet 34 g, 34 g, Oral, Daily, Wouk, Wilfred Curtis, MD, 34 g at 03/12/23 1019   rifaximin (XIFAXAN) tablet 550 mg, 550 mg, Oral, BID, Mikey College T, MD, 550 mg at 03/13/23 1108   spiritus frumenti (ethyl alcohol) solution 1 each, 1 each, Oral, TID, Lolita Patella B, MD, 1 each at 03/13/23 1107   thiamine (VITAMIN B1) tablet 100 mg, 100 mg, Oral, Daily, 100 mg at 03/13/23 0852 **OR** thiamine (VITAMIN B1) injection 100 mg, 100 mg, Intravenous, Daily, Mikey College T, MD   traZODone (DESYREL) tablet 200 mg, 200 mg, Oral, QHS PRN, Mikey College T, MD, 200 mg at 03/07/23 2213    ALLERGIES   Patient has no known allergies.     REVIEW OF SYSTEMS     Review of Systems:  Gen:  Denies  fever, sweats, chills weigh loss  HEENT: Denies blurred vision, double vision, ear pain, eye pain, hearing loss, nose bleeds, sore throat Cardiac:  No dizziness, chest pain or heaviness, chest tightness,edema Resp:   reports dyspnea chronically  Gi: Denies swallowing difficulty, stomach pain, nausea or vomiting, diarrhea, constipation, bowel incontinence Gu:  Denies bladder incontinence, burning urine Ext:   Denies Joint pain, stiffness or swelling Skin: Denies  skin rash, easy bruising or bleeding or hives Endoc:  Denies polyuria, polydipsia , polyphagia or weight change Psych:   Denies depression, insomnia or hallucinations   Other:  All other systems negative   VS: BP 98/74 (BP Location: Right Arm)   Pulse 81   Temp 98.7 F (37.1 C) (Oral)   Resp 20   Ht 5\' 1"  (1.549 m)   Wt 47.4 kg   SpO2 100%   BMI 19.73 kg/m      PHYSICAL EXAM    GENERAL:NAD, no fevers, chills, no weakness no fatigue HEAD: Normocephalic, atraumatic.  EYES: Pupils equal, round, reactive to light. Extraocular muscles intact. No scleral icterus.  MOUTH: Moist mucosal membrane. Dentition intact. No abscess noted.  EAR, NOSE, THROAT: Clear without exudates. No external lesions.  NECK: Supple. No thyromegaly. No nodules. No JVD.  PULMONARY: decreased breath sounds with mild rhonchi worse at bases bilaterally.  CARDIOVASCULAR: S1 and S2. Regular rate and rhythm. No murmurs, rubs, or gallops. No edema. Pedal pulses 2+ bilaterally.  GASTROINTESTINAL: Soft, nontender, nondistended. No masses. Positive bowel sounds. No hepatosplenomegaly.  MUSCULOSKELETAL: No swelling, clubbing, or edema. Range of motion full in all extremities.  NEUROLOGIC: Cranial nerves II through XII are intact. No gross focal neurological deficits. Sensation intact. Reflexes intact.  SKIN: No ulceration, lesions, rashes, or cyanosis. Skin warm and dry. Turgor intact.  PSYCHIATRIC: Mood, affect within  normal limits. The patient is awake, alert and oriented x 3. Insight, judgment intact.       IMAGING       Impression  CLINICAL DATA:  Abnormal chest x-ray.  Lung mass.  Weight loss.  EXAM: CT CHEST WITH CONTRAST   TECHNIQUE: Multidetector CT imaging of the chest was performed during intravenous contrast administration.   RADIATION DOSE REDUCTION: This exam was performed according to the departmental dose-optimization program which includes automated exposure control, adjustment of the mA and/or kV according to patient size and/or use of iterative reconstruction technique.   CONTRAST:  75mL OMNIPAQUE IOHEXOL 300 MG/ML  SOLN   COMPARISON:  X-ray earlier 03/05/2023.  CT scan September 2015   FINDINGS: Cardiovascular: Heart is nonenlarged. No pericardial effusion. Normal caliber thoracic aorta.   Mediastinum/Nodes: No specific abnormal lymph node enlargement identified in the axillary regions. No abnormal nodes in the right hilum. There are some small left hilar and mediastinal nodes. Example left infrahilar region measures 15 x 7 mm. Precarinal node measures 8 mm in short axis on series 2, image 64. Normal caliber thoracic esophagus. Preserved thyroid gland.   Lungs/Pleura: Diffuse breathing motion. Right lung is grossly clear without consolidation, pneumothorax or effusion. There is a 3 mm right lower lobe lung nodule on series 2, image 91. Not clearly seen previously in 2015.   However the left lung has a area of consolidation left lower lobe with some air bronchograms, associated pleural thickening and a loculated pleural effusion with some air, loculated hydropneumothorax. Overall this measures a proximally 9.3 x 2.6 cm in the axial plane and extends cephalocaudal of up to 19 cm proximally. This extends up to a cavitary lesion in the posterior left upper lobe abutting the pleura with thick irregular walls measuring 5.9 x 4.0 cm. Along the superior aspect of  the consolidation left lower lobe is a nodular component extending into the superior segment and slightly crossing the interlobar fissure. This has a nodular component with spiculations on series 2, image 80 measures 2.0 by 1.7 cm.   There are some small nodules elsewhere in the left lung such as series 2, image 60 measuring 5 mm. Other foci on image 66.   Upper Abdomen: Adrenal glands are preserved in the upper abdomen. Stone in the nondilated gallbladder.   Musculoskeletal: Curvature of the thoracic spine with some mild degenerative changes.   IMPRESSION: 5.9 cm cavitary mass in the left upper lobe abutting the pleura with irregular and thickened margins. Associated loculated left hydropneumothorax extending down to the diaphragm with consolidative opacity in the left lower lobe.   Additional nodular component seen adjacent to the consolidation in the superior segment left lower lobe which may cross the pleura.   Overall differential would include atypical infection such as TB versus neoplasm amongst other differential. Recommend additional workup.   Few separate tiny lung nodules elsewhere and some prominent lymph nodes in the mediastinum and left hilum. Attention on short follow-up.   Gallstones.     Electronically Signed   By: Karen Kays M.D.   On: 03/05/2023 16:54    ASSESSMENT/PLAN   Left hydropneumothorax - patient does have some risk factors for TB including alcoholism and sharing a home with many people who come and go.   -He has smoked his entire life and also has malignancy risk  -he has never had cancer but does have family history including mother with breast cancer and grandmother with breast cancer and uncle with some kind of cancer. - patient would benefit from IR consultation and possible pleural drain.  He may need thoracic surgery evaluation.  -He has lost appx 40 lbs and is currently less then 100lbs with BMI <20.  This may be due to alcoholism  vs  cancer -He has hyponatremia and reports disequlibrium. 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  -currently he remain in empiric on Zosyn and Vancomycin IV with phramacy consultation -serum fungitell -legionella ab -strep pneumoniae ur AG -Histoplasma Ur Ag -sputum resp cultures -AFB sputum expectorated specimen -sputum cytology  -reviewed pertinent imaging with patient today - ESR/CRP/procalcitonin/MRSA PCR -please encourage patient to use incentive spirometer few times each hour while hospitalized.       Left chest parapneumonic effusion/empyema    - currently there is no bacteria growing in culture   - there is fibrinous debris with pus consistent with empyema   - he is coughing up more mucopurulent material    -he has completed zosyn IV and now on augmentin   - his wbc count is improved   - repeat cxr today   - may need thoracic surgery evaluation       Thank you for allowing me to participate in the care of this patient.   Patient/Family are satisfied with care plan and all questions have been answered.    Provider disclosure: Patient with at least one acute or chronic illness or injury that poses a threat to life or bodily function and is being managed actively during this encounter.  All of the below services have been performed independently by signing provider:  review of prior documentation from internal and or external health records.  Review of previous and current lab results.  Interview and comprehensive assessment during patient visit today. Review of current and previous chest radiographs/CT scans. Discussion of management and test interpretation with health care team and patient/family.   This document was prepared using Dragon voice recognition software and may include unintentional dictation errors.     Vida Rigger, M.D.  Division of Pulmonary & Critical Care Medicine

## 2023-03-13 NOTE — Progress Notes (Signed)
PROGRESS NOTE    Keith Parker  WUJ:[id number] DOB: March 18, 1976 DOA: 03/05/2023 PCP: Leanna Sato, MD    Brief Narrative:  47 y.o. male with medical history significant of alcoholic liver cirrhosis, polio with chronic right leg pain, anxiety/depression, presented with worsening of productive cough, loss of appetite, malaise and weight loss.   Symptoms started 6 months ago, patient started develop productive cough with thick brownish sputum, night> day, associated with loss of appetite and malaise.  He does not see any blood streaks in the sputum.  Denies any fever chills no night sweat.  He lost about 25 pounds from 120 to 95 pounds in 6 months.  Yesterday he went to see PCP who recommended patient come to ED for evaluation.  9/10: Seen in consultation by pulmonary.  Interventional radiology consultation requested.  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      Assessment & Plan:   Principal Problem:   Cavitary pneumonia Active Problems:   Alcoholic cirrhosis of liver with ascites (HCC)   Weight loss   Lobar pneumonia (HCC)   Hydropneumothorax   Protein-calorie malnutrition, severe   Left lung cavity pneumonia Bronchopleural fistula Hydropneumothorax  Infection and malignancy both on differential.  TB remains on differential given background of alcoholism and living in halfway house/group home.  Pulmonary following.  Interventional radiology engaged. Plan: IV Zosyn transitioned to augmentin by pulm Tb negative Legionella neg Strep pneumo urinary antigen neg Sputum respiratory culture normal growth Fluid culture negative Chest tube removed today Will check cxr tomorrow, if clear can likely d/c possibly w/ abx with plan for outpatient pulm f/u.  Hypotension Improved, lactate normalized. Stable now off fluids  Hyponatremia Resolved with fluids   Anemia, normocytic Iron deficiency anemia Iron indices low. Hgb stable in the ~8s   Plan: Received IV Venofer 300 mg x 1   Liver cirrhosis -Blood pressure borderline low probably secondary to chronic poor nutrition and weight loss, hold off on Lasix and Aldactone -Continue rifaximin   Alcohol abuse Patient drinks approximately 40 ounces alcohol per day.  Not interested in quitting at this time. No s/s withdrawal. Hcv/hiv neg Plan: Will order patient beer in the hospital  Tobacco abuse Nicotine patch  DVT prophylaxis: SQ Lovenox Code Status: Full Family Communication: None Disposition Plan: Status is: Inpatient Remains inpatient appropriate because: monitoring after discontinuation of chest tube   Level of care: Progressive  Consultants:  Pulmonary Interventional radiology  Procedures:  IR chest tube 9/12  Antimicrobials: Zosyn>augmentin   Subjective: Seen and examined.  Resting in bed. Breathing stable. Feeling good after chest tube removal  Objective: Vitals:   03/13/23 0351 03/13/23 0405 03/13/23 0916 03/13/23 1059  BP: 92/65  109/81 98/74  Pulse: 88  87 81  Resp: 17  18 20   Temp: 98.1 F (36.7 C)  98.9 F (37.2 C) 98.7 F (37.1 C)  TempSrc: Oral  Oral Oral  SpO2: 100%  100% 100%  Weight:  47.4 kg    Height:        Intake/Output Summary (Last 24 hours) at 03/13/2023 1421 Last data filed at 03/13/2023 1300 Gross per 24 hour  Intake 1231 ml  Output 2140 ml  Net -909 ml   Filed Weights   03/10/23 0600 03/12/23 0500 03/13/23 0405  Weight: 47.6 kg 46.8 kg 47.4 kg    Examination:  General exam: NAD.  Appears frail and chronically ill Respiratory system: clear Cardiovascular system: S1-S2, RRR, no murmurs, no pedal edema  Gastrointestinal system: Thin, soft, NT/ND, normal bowel sounds Central nervous system: Alert and oriented. No focal neurological deficits. Extremities: Symmetric 5 x 5 power. Skin: No rashes, lesions or ulcers. Left chest tube in place Psychiatry: Judgement and insight appear normal. Mood & affect appropriate.      Data Reviewed: I have personally reviewed following labs and imaging studies  CBC: Recent Labs  Lab 03/07/23 0516 03/08/23 1421 03/08/23 2321 03/09/23 0649 03/10/23 0738 03/11/23 0308 03/12/23 0448  WBC 9.7 9.6  --  7.0 8.1 7.9 7.5  NEUTROABS 7.1  --   --   --   --   --   --   HGB 9.0* 7.3* 7.6* 8.0* 8.3* 9.4* 7.9*  HCT 28.2* 23.1* 24.0* 25.0* 26.4* 30.6* 25.1*  MCV 89.5 90.9  --  90.3 89.8 91.9 91.3  PLT 326 301  --  284 320 322 303   Basic Metabolic Panel: Recent Labs  Lab 03/08/23 0433 03/09/23 0649 03/10/23 0738 03/10/23 0749 03/11/23 0308 03/13/23 0802  NA 134* 132*  --  132* 135 134*  K 4.0 4.1  --  4.2 4.5 4.2  CL 101 100  --  102 103 100  CO2 24 24  --  23 26 25   GLUCOSE 100* 143*  --  94 85 92  BUN 6 8  --  9 10 11   CREATININE 0.53* 0.49*  --  0.48* 0.56* 0.61  CALCIUM 8.2* 8.0*  --  7.9* 8.5* 8.7*  MG  --   --  1.9  --   --   --   PHOS  --   --  3.4  --   --   --    GFR: Estimated Creatinine Clearance: 74.9 mL/min (by C-G formula based on SCr of 0.61 mg/dL). Liver Function Tests: Recent Labs  Lab 03/09/23 0649  AST 27  ALT 19  ALKPHOS 106  BILITOT 0.5  PROT 5.3*  ALBUMIN 1.8*   No results for input(s): "LIPASE", "AMYLASE" in the last 168 hours.  No results for input(s): "AMMONIA" in the last 168 hours. Coagulation Profile: Recent Labs  Lab 03/07/23 0516 03/09/23 0649  INR 1.3* 1.1   Cardiac Enzymes: No results for input(s): "CKTOTAL", "CKMB", "CKMBINDEX", "TROPONINI" in the last 168 hours. BNP (last 3 results) No results for input(s): "PROBNP" in the last 8760 hours. HbA1C: No results for input(s): "HGBA1C" in the last 72 hours. CBG: No results for input(s): "GLUCAP" in the last 168 hours. Lipid Profile: No results for input(s): "CHOL", "HDL", "LDLCALC", "TRIG", "CHOLHDL", "LDLDIRECT" in the last 72 hours. Thyroid Function Tests: No results for input(s): "TSH", "T4TOTAL", "FREET4", "T3FREE", "THYROIDAB" in the last 72  hours. Anemia Panel: No results for input(s): "VITAMINB12", "FOLATE", "FERRITIN", "TIBC", "IRON", "RETICCTPCT" in the last 72 hours.  Sepsis Labs: Recent Labs  Lab 03/08/23 0433 03/08/23 1425 03/09/23 0649 03/10/23 0738 03/10/23 0817 03/11/23 0308  PROCALCITON 0.12  --  0.10 0.10  --  0.13  LATICACIDVEN  --  2.4* 2.4*  --  1.2  --     Recent Results (from the past 240 hour(s))  Blood Culture (routine x 2)     Status: None   Collection Time: 03/05/23  2:40 PM   Specimen: BLOOD  Result Value Ref Range Status   Specimen Description BLOOD BLOOD RIGHT ARM  Final   Special Requests   Final    BOTTLES DRAWN AEROBIC AND ANAEROBIC Blood Culture adequate volume   Culture   Final  NO GROWTH 5 DAYS Performed at [facility], [address] [place] [place], [place] [address]    Report Status 03/10/2023 FINAL  Final  Blood Culture (routine x 2)     Status: None   Collection Time: 03/05/23  2:45 PM   Specimen: BLOOD  Result Value Ref Range Status   Specimen Description BLOOD LEFT ANTECUBITAL  Final   Special Requests   Final    BOTTLES DRAWN AEROBIC AND ANAEROBIC Blood Culture adequate volume   Culture   Final    NO GROWTH 5 DAYS Performed at [facility], [address], [place], [place] [address]    Report Status 03/10/2023 FINAL  Final  MRSA Next Gen by PCR, Nasal     Status: None   Collection Time: 03/06/23  8:09 AM   Specimen: Nasal Mucosa; Nasal Swab  Result Value Ref Range Status   MRSA by PCR Next Gen NOT DETECTED NOT DETECTED Final    Comment: (NOTE) The GeneXpert MRSA Assay (FDA approved for NASAL specimens only), is one component of a comprehensive MRSA colonization surveillance program. It is not intended to diagnose MRSA infection nor to guide or monitor treatment for MRSA infections. Test performance is not FDA approved in patients less than 44 years old. Performed at [facility], [address] [place], [place] [address]    Expectorated Sputum Assessment w Gram Stain, Rflx to Resp Cult     Status: None   Collection Time: 03/06/23  1:50 PM   Specimen: Sputum  Result Value Ref Range Status   Specimen Description SPUTUM  Final   Special Requests NONE  Final   Sputum evaluation   Final    THIS SPECIMEN IS ACCEPTABLE FOR SPUTUM CULTURE Performed at [facility] [address] [place], [place] [address]    Report Status 03/06/2023 FINAL  Final  Acid Fast Smear (AFB)     Status: None   Collection Time: 03/06/23  1:50 PM   Specimen: Sputum  Result Value Ref Range Status   AFB Specimen Processing Concentration  Final   Acid Fast Smear Negative  Final    Comment: (NOTE) Performed At: [facility] [address] [place], [place] [id number] Jolene Schimke MD MV:[id number]    Source (AFB) SPUTUM  Final    Comment: Performed at [facility], [address] [place], [place] [address]  Culture, Respiratory w Gram Stain     Status: None   Collection Time: 03/06/23  1:50 PM   Specimen: SPU  Result Value Ref Range Status   Specimen Description   Final    SPUTUM Performed at [facility], [address] [place], [place] [address]    Special Requests   Final    NONE Reflexed from [phone] Performed at [facility], [address] [place], [place] [address]    Gram Stain   Final    MODERATE WBC PRESENT,BOTH PMN AND MONONUCLEAR RARE GRAM NEGATIVE RODS    Culture   Final    Normal respiratory flora-no Staph aureus or Pseudomonas seen Performed at [facility], [address], [place], [place] [address]    Report Status 03/09/2023 FINAL  Final  Culture, Respiratory w Gram Stain     Status: None   Collection Time: 03/06/23  6:30 PM   Specimen: Sputum; Respiratory  Result Value Ref Range Status   Specimen Description   Final    SPU Performed at [facility], [address]  [place], [place] [address]    Special Requests   Final     NONE Performed at [facility], [address] [place], [place] [address]    Gram Stain   Final    ABUNDANT WBC PRESENT, PREDOMINANTLY PMN NO ORGANISMS SEEN    Culture   Final    Normal respiratory flora-no Staph aureus or Pseudomonas seen Performed at [facility], [address], [place], [place] [address]    Report Status 03/10/2023 FINAL  Final  Acid Fast Smear (AFB)     Status: None   Collection Time: 03/06/23  6:30 PM   Specimen: Sputum  Result Value Ref Range Status   AFB Specimen Processing Concentration  Final   Acid Fast Smear Negative  Final    Comment: (NOTE) Performed At: [facility] [address] [place], [place] [id number] Jolene Schimke MD [id number]    Source (AFB) SPUTUM  Final    Comment: Performed at [facility], [address] [place], [place] [address]  MTB-RIF NAA with AFB Culture, sputum (q8 x 3)     Status: None (Preliminary result)   Collection Time: 03/08/23  6:35 AM   Specimen: Sputum  Result Value Ref Range Status   Myco tuberculosis Complex Comment NOT DETECTED Final    Comment: Mycobacterium tuberculosis complex (MTBC) NOT detected.   Rifampin Comment NOT DETECTED Final    Comment: (NOTE) Because Mycobacterium tuberculosis complex (MTBC) was not detected, no rifampin determination is possible.    AFB Specimen Processing Concentration  Final    Comment: (NOTE) Performed At: [facility] [address] [place], [place] [id number] Jolene Schimke MD [id number]    Acid Fast Culture PENDING  Incomplete   Source (MTB RIF) EXPECTORATED SPUTUM  Final    Comment: Performed at [facility], [address] [place], [place] [address]  Aerobic/Anaerobic Culture w Gram Stain (surgical/deep wound)     Status: None (Preliminary result)   Collection Time: 03/08/23 12:58 PM   Specimen: Abscess  Result Value Ref Range Status   Specimen Description   Final    ABSCESS Performed at [facility], [address] [place], [place] [address]    Special Requests   Final    LEFT CHEST EMPYEMA Performed at [facility], [address] [place], [place] [address]    Gram Stain   Final    ABUNDANT WBC PRESENT,BOTH PMN AND MONONUCLEAR NO ORGANISMS SEEN    Culture   Final    NO GROWTH 4 DAYS NO ANAEROBES ISOLATED; CULTURE IN PROGRESS FOR 5 DAYS Performed at [facility], [address], [place], [place] [address]    Report Status PENDING  Incomplete  MTB-RIF NAA with AFB Culture, sputum (q8 x 3)     Status: None (Preliminary result)   Collection Time: 03/08/23  1:15 PM   Specimen: Sputum  Result Value Ref Range Status   Myco tuberculosis Complex Comment NOT DETECTED Final    Comment: Mycobacterium tuberculosis complex (MTBC) NOT detected.   Rifampin Comment NOT DETECTED Final    Comment: (NOTE) Because Mycobacterium tuberculosis complex (MTBC) was not detected, no rifampin determination is possible.    AFB Specimen Processing Concentration  Final    Comment: (NOTE) Performed At: [facility] [address] [place], [place] [id number] Jolene Schimke MD [id number]    Acid Fast Culture PENDING  Incomplete   Source (MTB RIF) SPUTUM  Final    Comment: Performed at [facility], [address], [place], [place] [address]         Radiology Studies: DG Chest [place] 1 View  Result Date: 03/13/2023 CLINICAL DATA:  [id number] Encounter for chest tube removal [id number] EXAM: PORTABLE CHEST 1 VIEW COMPARISON:  March 11, 2023 FINDINGS: Left-sided pigtail pleural drainage catheter remains near the left lung base. Cardiomediastinal silhouette is within normal limits. Blunting of the left costophrenic angle with small left effusion. No pneumothorax. Right lung remains clear. IMPRESSION: Stable positioning of left-sided pigtail pleural drainage catheter. Small pleural effusion remains without pneumothorax. Electronically Signed   By: Olive Bass M.D.   On: 03/13/2023 09:51        Scheduled Meds:  amoxicillin-clavulanate  1 tablet Oral Q12H   benzonatate  200 mg Oral BID   enoxaparin (LOVENOX) injection  40 mg Subcutaneous Q24H   ferrous sulfate  325 mg Oral BID WC   folic acid  1 mg Oral Daily   gabapentin  300 mg Oral TID   guaiFENesin  1,200 mg Oral BID   lidocaine (PF)  10 mL Intradermal Once   multivitamin with minerals  1 tablet Oral Daily   nicotine  21 mg Transdermal Daily   polyethylene glycol  34 g Oral Daily   rifaximin  550 mg Oral BID   spiritus frumenti  1 each Oral TID   thiamine  100 mg Oral Daily   Or   thiamine  100 mg Intravenous Daily   Continuous Infusions:  sodium chloride Stopped (03/07/23 1818)     LOS: 8 days     Silvano Bilis, MD [facility]   If 7PM-7AM, please contact night-coverage  03/13/2023, 2:21 PM

## 2023-03-13 NOTE — Care Management Important Message (Signed)
Important Message  Patient Details  Name: Keith Parker MRN: [id number] Date of Birth: [birth year]   Medicare Important Message Given:  Yes     Johnell Comings 03/13/2023, 10:10 AM

## 2023-03-13 NOTE — Plan of Care (Signed)

## 2023-03-14 ENCOUNTER — Inpatient Hospital Stay: Payer: Medicare Other

## 2023-03-14 DIAGNOSIS — J189 Pneumonia, unspecified organism: Secondary | ICD-10-CM | POA: Diagnosis not present

## 2023-03-14 DIAGNOSIS — J984 Other disorders of lung: Secondary | ICD-10-CM

## 2023-03-14 MED ORDER — OXYCODONE HCL 5 MG PO TABS: 5.0000 mg | ORAL_TABLET | Freq: Four times a day (QID) | ORAL | 0 refills | Status: DC | PRN

## 2023-03-14 MED ORDER — POLYETHYLENE GLYCOL 3350 17 G PO PACK: 34.0000 g | PACK | Freq: Every day | ORAL | Status: DC | PRN

## 2023-03-14 MED ORDER — VITAMIN B-1 100 MG PO TABS: 100.0000 mg | ORAL_TABLET | Freq: Every day | ORAL | 0 refills | Status: DC

## 2023-03-14 MED ORDER — BENZONATATE 200 MG PO CAPS: 200.0000 mg | ORAL_CAPSULE | Freq: Two times a day (BID) | ORAL | 0 refills | Status: AC | PRN

## 2023-03-14 MED ORDER — GUAIFENESIN ER 600 MG PO TB12: 1200.0000 mg | ORAL_TABLET | Freq: Two times a day (BID) | ORAL | 0 refills | Status: DC | PRN

## 2023-03-14 MED ORDER — NICOTINE 21 MG/24HR TD PT24: 21.0000 mg | MEDICATED_PATCH | Freq: Every day | TRANSDERMAL | 0 refills | Status: DC

## 2023-03-14 MED ORDER — FERROUS SULFATE 325 (65 FE) MG PO TABS: 325.0000 mg | ORAL_TABLET | Freq: Every day | ORAL | 0 refills | Status: AC

## 2023-03-14 MED ORDER — FOLIC ACID 1 MG PO TABS: 1.0000 mg | ORAL_TABLET | Freq: Every day | ORAL | 0 refills | Status: AC

## 2023-03-14 MED ORDER — FUROSEMIDE 20 MG PO TABS: 10.0000 mg | ORAL_TABLET | Freq: Every day | ORAL | Status: AC

## 2023-03-14 MED ORDER — AMOXICILLIN-POT CLAVULANATE 875-125 MG PO TABS: 1.0000 | ORAL_TABLET | Freq: Three times a day (TID) | ORAL | 0 refills | Status: AC

## 2023-03-14 MED ORDER — SPIRONOLACTONE 25 MG PO TABS: 100.0000 mg | ORAL_TABLET | Freq: Every day | ORAL | 0 refills | Status: DC

## 2023-03-14 MED ORDER — ADULT MULTIVITAMIN W/MINERALS CH: 1.0000 | ORAL_TABLET | Freq: Every day | ORAL | Status: AC

## 2023-03-14 NOTE — Discharge Summary (Signed)
Physician Discharge Summary   Patient: Keith Parker MRN: [id number]  DOB: 08-31-1975   Admit:     Date of Admission: 03/05/2023 Admitted from: home   Discharge: Date of discharge: 03/14/2023 Disposition: Home Condition at discharge: good  CODE STATUS: FULL CODE     Discharge Physician: Sunnie Nielsen, DO [facility]     PCP: Leanna Sato, MD  Recommendations for Outpatient Follow-up:  Follow up with PCP Leanna Sato, MD and w/ pulmonology in 1-2 weeks    Discharge Instructions     Diet - low sodium heart healthy   Complete by: As directed    Increase activity slowly   Complete by: As directed    No wound care   Complete by: As directed          Discharge Diagnoses: Principal Problem:   Cavitary pneumonia Active Problems:   Alcoholic cirrhosis of liver with ascites (HCC)   Weight loss   Lobar pneumonia (HCC)   Hydropneumothorax   Protein-calorie malnutrition, severe       Hospital Course:  Keith Parker is a 47 y.o. male with medical history significant of alcoholic liver cirrhosis, polio with chronic right leg pain, anxiety/depression, presented with worsening of productive cough, loss of appetite, malaise and weight loss, starting 6 mos ago productive cough with thick brownish sputum, night> day, associated with loss of appetite and malaise.  He does not see any blood streaks in the sputum.  Denies any fever chills no night sweat.  He lost about 25 pounds from 120 to 95 pounds in 6 months.  Yesterday he went to see PCP who recommended patient come to ED for evaluation.  09/09:  CT chest 5.9 cm cavity mass in the left upper lobe with irregular and thickened margins associated with loculated left hydropneumothorax, consolidated opacity in the left lower lobe.  Lab work hemoglobin 9.2, WBC 6.4. admitted to hospitalist service.  09/10: beginning infectious w/u and continue abx 09/11: IR consult for chest tube but procedure was postponed.  He  is on antibiotics with zosyn and vanco.  MRSA negative. Sputum culture is negative and procalcitonin is flat.  09/12: chest tube today. CRP remains elevated and he may still require throacic surgery evaluation   09/13: Chest tube management with + air leak on forced expiratory maneuver, fluid has been aspirated. 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   09/15: Air leak still present but actually reduced.  Plan continue current antimicrobials empirically. CXR  with only minimal residual effusion.  09/16:  infectious workup is all negative thus far.concern for neoplasm related lesion. Today clamp tube and check for re-accumulation of fluid/air with repeat chest imaging in am.  IF all stable may remove  chest tube and prepare for dc home.   09/17: patient is feeling well on room air.  He feels close to baseline.  He had clamped chest tube overnight. 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 Pulmonary following.  Interventional radiology engaged. Plan: IV Zosyn transitioned to augmentin by pulm TB, Legionella, Strep pneumo urinary antigen all neg Sputum respiratory culture  normal growth Fluid culture negative Chest tube inserted 09/12, removed 09/17 Follow outpatient pulmonology    Hypotension Improved, lactate normalized. Stable now off fluids   Hyponatremia Resolved with fluids   Anemia, normocytic Iron deficiency anemia Iron indices low. Hgb stable in the ~8s  Plan: Received IV Venofer 300 mg x 1   Liver cirrhosis -Blood pressure  borderline low probably secondary to chronic poor nutrition and weight loss, hold off on Lasix and Aldactone -Continue rifaximin   Alcohol abuse Patient drinks approximately 40 ounces alcohol per day.  Not interested in quitting at this time. No s/s withdrawal. Hcv/hiv neg Plan: Will order patient beer in the hospital   Tobacco abuse Nicotine patch            Discharge Instructions  Allergies as of 03/14/2023   No Known Allergies      Medication List     STOP taking these medications    nadolol 20 MG tablet Commonly known as: CORGARD   Paxlovid (300/100) 20 x 150 MG & 10 x 100MG  Tbpk Generic drug: nirmatrelvir & ritonavir       TAKE these medications    amoxicillin-clavulanate 875-125 MG tablet Commonly known as: AUGMENTIN Take 1 tablet by mouth in the morning, at noon, and at bedtime for 5 days.   benzonatate 200 MG capsule Commonly known as: TESSALON Take 1 capsule (200 mg total) by mouth 2 (two) times daily as needed for cough.   ferrous sulfate 325 (65 FE) MG tablet Take 1 tablet (325 mg total) by mouth daily with breakfast.   folic acid 1 MG tablet Commonly known as: FOLVITE Take 1 tablet (1 mg total) by mouth daily. Start taking on: March 15, 2023   furosemide 20 MG tablet Commonly known as: LASIX Take 0.5 tablets (10 mg total) by mouth daily. What changed: how much to take   gabapentin 300 MG capsule Commonly known as: NEURONTIN Take 300 mg by mouth 3 (three) times daily.   guaiFENesin 600 MG 12 hr tablet Commonly known as: MUCINEX Take 2 tablets (1,200 mg total) by mouth 2 (two) times daily as needed for to loosen phlegm or cough.   hydrOXYzine 25 MG tablet Commonly known as: ATARAX Take 1 tablet (25 mg total) by mouth every 8 (eight) hours as needed for anxiety.   mirtazapine 15 MG tablet Commonly known as: REMERON Take 15 mg by mouth at bedtime.   multivitamin with minerals Tabs tablet Take 1 tablet by mouth daily. Start  taking on: March 15, 2023   nicotine 21 mg/24hr patch Commonly known as: NICODERM CQ - dosed in mg/24 hours Place 1 patch (21 mg total) onto the skin daily. Start taking on: March 15, 2023   oxyCODONE 5 MG immediate release tablet Commonly known as: Oxy IR/ROXICODONE Take 1 tablet (5 mg total) by mouth every 6 (six) hours as needed for severe pain.   polyethylene glycol 17 g packet Commonly known as: MIRALAX / GLYCOLAX Take 34 g by mouth daily as needed for mild constipation or moderate constipation.   spironolactone 25 MG tablet Commonly known as: ALDACTONE Take 4 tablets (100 mg total) by mouth daily. What changed: medication strength   thiamine 100 MG tablet Commonly known as: Vitamin B-1 Take 1 tablet (100 mg total) by mouth daily. Start taking on: March 15, 2023   traZODone 100 MG tablet Commonly known as: DESYREL Take 200 mg by mouth at bedtime as needed.   Xifaxan 550 MG Tabs tablet Generic drug:  rifaximin Take 550 mg by mouth 2 (two) times daily.          No Known Allergies   Subjective: pt reports feeling well, cough is stil present but improved, no SOB   Discharge Exam: BP 101/83 (BP Location: Right Arm)   Pulse 85   Temp 98.4 F (36.9 C) (Oral)   Resp 20   Ht 5\' 1"  (1.549 m)   Wt 47.4 kg   SpO2 100%   BMI 19.73 kg/m  General: Pt is alert, awake, not in acute distress Cardiovascular: RRR, S1/S2 +, no rubs, no gallops Respiratory: CTA bilaterally diminshe dbreath sounds  Abdominal: Soft, NT, ND, bowel sounds + Extremities: no edema, no cyanosis     The results of significant diagnostics from this hospitalization (including imaging, microbiology, ancillary and laboratory) are listed below for reference.     Microbiology: Recent Results (from the past 240 hour(s))  Blood Culture (routine x 2)     Status: None   Collection Time: 03/05/23  2:40 PM   Specimen: BLOOD  Result Value Ref Range Status   Specimen Description BLOOD  BLOOD RIGHT ARM  Final   Special Requests   Final    BOTTLES DRAWN AEROBIC AND ANAEROBIC Blood Culture adequate volume   Culture   Final    NO GROWTH 5 DAYS Performed at [facility], [address], [place], [place] [address]    Report Status 03/10/2023 FINAL  Final  Blood Culture (routine x 2)     Status: None   Collection Time: 03/05/23  2:45 PM   Specimen: BLOOD  Result Value Ref Range Status   Specimen Description BLOOD LEFT ANTECUBITAL  Final   Special Requests   Final    BOTTLES DRAWN AEROBIC AND ANAEROBIC Blood Culture adequate volume   Culture   Final    NO GROWTH 5 DAYS Performed at [facility], [address], [place], [place] [address]    Report Status 03/10/2023 FINAL  Final  MRSA Next Gen by PCR, Nasal     Status: None   Collection Time: 03/06/23  8:09 AM   Specimen: Nasal Mucosa; Nasal Swab  Result Value Ref Range Status   MRSA by PCR Next Gen NOT DETECTED NOT DETECTED Final    Comment: (NOTE) The GeneXpert MRSA Assay (FDA approved for NASAL specimens only), is one component of a comprehensive MRSA colonization surveillance program. It is not intended to diagnose MRSA infection nor to guide or monitor treatment for MRSA infections. Test performance is not FDA approved in patients less than 72 years old. Performed at [facility], [address] [place], [place] [address]   Expectorated Sputum Assessment w Gram Stain, Rflx to Resp Cult     Status: None   Collection Time: 03/06/23  1:50 PM   Specimen: Sputum  Result Value Ref Range Status   Specimen Description SPUTUM  Final   Special Requests NONE  Final   Sputum evaluation   Final    THIS SPECIMEN IS ACCEPTABLE FOR SPUTUM CULTURE Performed at [facility], [address], [place], [place] [address]    Report Status 03/06/2023 FINAL  Final  Acid Fast Smear (AFB)     Status: None   Collection Time: 03/06/23  1:50 PM   Specimen: Sputum  Result Value Ref Range Status    AFB Specimen Processing Concentration  Final   Acid Fast Smear Negative  Final    Comment: (NOTE) Performed At: [facility] [address] [place], [place] [id number] Jolene Schimke MD NW:[id number]    Source (AFB) SPUTUM  Final    Comment: Performed at [facility], [address] [place], [place] [address]  Culture, Respiratory w Gram Stain     Status: None   Collection Time: 03/06/23  1:50 PM   Specimen: SPU  Result Value Ref Range Status   Specimen Description   Final    SPUTUM Performed at [facility], [address] [place], [place] [address]    Special Requests   Final    NONE Reflexed from [id number] Performed at [facility], [address] [place], [place] [address]    Gram Stain   Final    MODERATE WBC PRESENT,BOTH PMN AND MONONUCLEAR RARE GRAM NEGATIVE RODS    Culture   Final    Normal respiratory flora-no Staph aureus or Pseudomonas seen Performed at [facility], [address], [place], [place] [address]    Report Status 03/09/2023 FINAL  Final  Culture, Respiratory w Gram Stain     Status: None   Collection Time: 03/06/23  6:30 PM   Specimen: Sputum; Respiratory  Result Value Ref Range Status   Specimen Description   Final    SPU Performed at [facility], [address] [place], [place] [address]    Special Requests   Final    NONE Performed at [facility], [address] [place], [place] [address]    Gram Stain   Final    ABUNDANT WBC PRESENT, PREDOMINANTLY PMN NO ORGANISMS SEEN    Culture   Final    Normal respiratory flora-no Staph aureus or Pseudomonas seen Performed at [facility], [address], [place], [place] [address]    Report Status 03/10/2023 FINAL  Final  Acid Fast Smear (AFB)     Status: None   Collection Time: 03/06/23  6:30 PM   Specimen: Sputum  Result Value Ref Range Status   AFB Specimen Processing Concentration  Final   Acid Fast Smear Negative   Final    Comment: (NOTE) Performed At: [facility] [address] [place], [place] [id number] Jolene Schimke MD JJ:[id number]    Source (AFB) SPUTUM  Final    Comment: Performed at [facility], [address] [place], [place] [address]  MTB-RIF NAA with AFB Culture, sputum (q8 x 3)     Status: None (Preliminary result)   Collection Time: 03/08/23  6:35 AM   Specimen: Sputum  Result Value Ref Range Status   Myco tuberculosis Complex Comment NOT DETECTED Final    Comment: Mycobacterium tuberculosis complex (MTBC) NOT detected.   Rifampin Comment NOT DETECTED Final    Comment: (NOTE) Because Mycobacterium tuberculosis complex (MTBC) was not detected, no rifampin determination is possible.    AFB Specimen Processing Concentration  Final    Comment: (NOTE) Performed At: [facility] [address] [place], [place] [id number] Jolene Schimke MD KG:[id number]    Acid Fast Culture PENDING  Incomplete   Source (MTB RIF) EXPECTORATED SPUTUM  Final    Comment: Performed at [facility], [address] [place], [place] [address]  Aerobic/Anaerobic Culture w Gram Stain (surgical/deep wound)     Status: None   Collection Time: 03/08/23 12:58 PM   Specimen: Abscess  Result Value Ref Range Status   Specimen Description   Final    ABSCESS Performed at [facility], [address], [place], [place] [address]  Special Requests   Final    LEFT CHEST EMPYEMA Performed at [facility], [address] [place], [place] [address]    Gram Stain   Final    ABUNDANT WBC PRESENT,BOTH PMN AND MONONUCLEAR NO ORGANISMS SEEN    Culture   Final    No growth aerobically or anaerobically. Performed at [facility], [address] [place], [place] [address]    Report Status 03/13/2023 FINAL  Final  MTB-RIF NAA with AFB Culture, sputum (q8 x 3)     Status: None (Preliminary result)   Collection Time: 03/08/23  1:15 PM   Specimen:  Sputum  Result Value Ref Range Status   Myco tuberculosis Complex Comment NOT DETECTED Final    Comment: Mycobacterium tuberculosis complex (MTBC) NOT detected.   Rifampin Comment NOT DETECTED Final    Comment: (NOTE) Because Mycobacterium tuberculosis complex (MTBC) was not detected, no rifampin determination is possible.    AFB Specimen Processing Concentration  Final    Comment: (NOTE) Performed At: [facility] [address] [place], [place] [id number] Jolene Schimke MD AY:[id number]    Acid Fast Culture PENDING  Incomplete   Source (MTB RIF) SPUTUM  Final    Comment: Performed at [facility], [address] [place], [place] [address]     Labs: BNP (last 3 results) Recent Labs    03/05/23 1403 03/08/23 0439  BNP 90.8 157.6*   Basic Metabolic Panel: Recent Labs  Lab 03/09/23 0649 03/10/23 0738 03/10/23 0749 03/11/23 0308 03/13/23 0802 03/14/23 0218  NA 132*  --  132* 135 134* 134*  K 4.1  --  4.2 4.5 4.2 3.9  CL 100  --  102 103 100 101  CO2 24  --  23 26 25 25   GLUCOSE 143*  --  94 85 92 89  BUN 8  --  9 10 11 11   CREATININE 0.49*  --  0.48* 0.56* 0.61 0.54*  CALCIUM 8.0*  --  7.9* 8.5* 8.7* 8.8*  MG  --  1.9  --   --   --   --   PHOS  --  3.4  --   --   --   --    Liver Function Tests: Recent Labs  Lab 03/09/23 0649  AST 27  ALT 19  ALKPHOS 106  BILITOT 0.5  PROT 5.3*  ALBUMIN 1.8*   No results for input(s): "LIPASE", "AMYLASE" in the last 168 hours. No results for input(s): "AMMONIA" in the last 168 hours. CBC: Recent Labs  Lab 03/08/23 1421 03/08/23 2321 03/09/23 0649 03/10/23 0738 03/11/23 0308 03/12/23 0448  WBC 9.6  --  7.0 8.1 7.9 7.5  HGB 7.3* 7.6* 8.0* 8.3* 9.4* 7.9*  HCT 23.1* 24.0* 25.0* 26.4* 30.6* 25.1*  MCV 90.9  --  90.3 89.8 91.9 91.3  PLT 301  --  284 320 322 303   Cardiac Enzymes: No results for input(s): "CKTOTAL", "CKMB", "CKMBINDEX", "TROPONINI" in the last 168 hours. BNP: Invalid input(s):  "POCBNP" CBG: No results for input(s): "GLUCAP" in the last 168 hours. D-Dimer No results for input(s): "DDIMER" in the last 72 hours. Hgb A1c No results for input(s): "HGBA1C" in the last 72 hours. Lipid Profile No results for input(s): "CHOL", "HDL", "LDLCALC", "TRIG", "CHOLHDL", "LDLDIRECT" in the last 72 hours. Thyroid function studies No results for input(s): "TSH", "T4TOTAL", "T3FREE", "THYROIDAB" in the last 72 hours.  Invalid input(s): "FREET3" Anemia work up No results for input(s): "VITAMINB12", "FOLATE", "FERRITIN", "  TIBC", "IRON", "RETICCTPCT" in the last 72 hours. Urinalysis    Component Value Date/Time   COLORURINE STRAW (A) 03/05/2023 1440   APPEARANCEUR CLEAR (A) 03/05/2023 1440   LABSPEC 1.003 (L) 03/05/2023 1440   LABSPEC 1.025 02/08/2018 1347   PHURINE 7.0 03/05/2023 1440   GLUCOSEU NEGATIVE 03/05/2023 1440   HGBUR NEGATIVE 03/05/2023 1440   BILIRUBINUR NEGATIVE 03/05/2023 1440   BILIRUBINUR negative 02/08/2018 1347   KETONESUR NEGATIVE 03/05/2023 1440   PROTEINUR NEGATIVE 03/05/2023 1440   UROBILINOGEN 1.0 03/31/2008 1300   NITRITE NEGATIVE 03/05/2023 1440   LEUKOCYTESUR NEGATIVE 03/05/2023 1440   Sepsis Labs Recent Labs  Lab 03/09/23 0649 03/10/23 0738 03/11/23 0308 03/12/23 0448  WBC 7.0 8.1 7.9 7.5   Microbiology Recent Results (from the past 240 hour(s))  Blood Culture (routine x 2)     Status: None   Collection Time: 03/05/23  2:40 PM   Specimen: BLOOD  Result Value Ref Range Status   Specimen Description BLOOD BLOOD RIGHT ARM  Final   Special Requests   Final    BOTTLES DRAWN AEROBIC AND ANAEROBIC Blood Culture adequate volume   Culture   Final    NO GROWTH 5 DAYS Performed at [facility], [address] [place] [address], [place], [place] [address]    Report Status 03/10/2023 FINAL  Final  Blood Culture (routine x 2)     Status: None   Collection Time: 03/05/23  2:45 PM   Specimen: BLOOD  Result Value Ref Range Status   Specimen  Description BLOOD LEFT ANTECUBITAL  Final   Special Requests   Final    BOTTLES DRAWN AEROBIC AND ANAEROBIC Blood Culture adequate volume   Culture   Final    NO GROWTH 5 DAYS Performed at [facility], [address], [place], [place] [address]    Report Status 03/10/2023 FINAL  Final  MRSA Next Gen by PCR, Nasal     Status: None   Collection Time: 03/06/23  8:09 AM   Specimen: Nasal Mucosa; Nasal Swab  Result Value Ref Range Status   MRSA by PCR Next Gen NOT DETECTED NOT DETECTED Final    Comment: (NOTE) The GeneXpert MRSA Assay (FDA approved for NASAL specimens only), is one component of a comprehensive MRSA colonization surveillance program. It is not intended to diagnose MRSA infection nor to guide or monitor treatment for MRSA infections. Test performance is not FDA approved in patients less than 43 years old. Performed at [facility], [address] [place], [place] [address]   Expectorated Sputum Assessment w Gram Stain, Rflx to Resp Cult     Status: None   Collection Time: 03/06/23  1:50 PM   Specimen: Sputum  Result Value Ref Range Status   Specimen Description SPUTUM  Final   Special Requests NONE  Final   Sputum evaluation   Final    THIS SPECIMEN IS ACCEPTABLE FOR SPUTUM CULTURE Performed at [facility], [address] [place] [employer] [place], [place] [address]    Report Status 03/06/2023 FINAL  Final  Acid Fast Smear (AFB)     Status: None   Collection Time: 03/06/23  1:50 PM   Specimen: Sputum  Result Value Ref Range Status   AFB Specimen Processing Concentration  Final   Acid Fast Smear Negative  Final    Comment: (NOTE) Performed At: [facility] [address] [place], [place] [id number] Jolene Schimke MD MW:[id number]    Source (AFB) SPUTUM  Final    Comment: Performed at  [facility], [address], [place], [place] [address]  Culture, Respiratory w Gram Stain     Status: None   Collection Time: 03/06/23  1:50 PM    Specimen: SPU  Result Value Ref Range Status   Specimen Description   Final    SPUTUM Performed at [facility], [address] [place], [place] [address]    Special Requests   Final    NONE Reflexed from [phone] Performed at [facility], [address] [place], [place] [address]    Gram Stain   Final    MODERATE WBC PRESENT,BOTH PMN AND MONONUCLEAR RARE GRAM NEGATIVE RODS    Culture   Final    Normal respiratory flora-no Staph aureus or Pseudomonas seen Performed at [facility], [address] [place], [place] [address]    Report Status 03/09/2023 FINAL  Final  Culture, Respiratory w Gram Stain     Status: None   Collection Time: 03/06/23  6:30 PM   Specimen: Sputum; Respiratory  Result Value Ref Range Status   Specimen Description   Final    SPU Performed at [facility], [address] [place], [place] [address]    Special Requests   Final    NONE Performed at [facility], [address] [place], [place] [address]    Gram Stain   Final    ABUNDANT WBC PRESENT, PREDOMINANTLY PMN NO ORGANISMS SEEN    Culture   Final    Normal respiratory flora-no Staph aureus or Pseudomonas seen Performed at [facility], [address] [place], [place] [address]    Report Status 03/10/2023 FINAL  Final  Acid Fast Smear (AFB)     Status: None   Collection Time: 03/06/23  6:30 PM   Specimen: Sputum  Result Value Ref Range Status   AFB Specimen Processing Concentration  Final   Acid Fast Smear Negative  Final    Comment: (NOTE) Performed At: [facility] [address] [place], [place] [id number] Jolene Schimke MD DG:[id number]    Source (AFB) SPUTUM  Final    Comment: Performed at [facility], [address] [place], [place] [address]  MTB-RIF NAA with AFB Culture, sputum (q8 x 3)     Status: None (Preliminary result)   Collection Time: 03/08/23  6:35 AM   Specimen: Sputum  Result Value Ref Range  Status   Myco tuberculosis Complex Comment NOT DETECTED Final    Comment: Mycobacterium tuberculosis complex (MTBC) NOT detected.   Rifampin Comment NOT DETECTED Final    Comment: (NOTE) Because Mycobacterium tuberculosis complex (MTBC) was not detected, no rifampin determination is possible.    AFB Specimen Processing Concentration  Final    Comment: (NOTE) Performed At: [facility] [address] [place], [place] [id number] Jolene Schimke MD WF:[id number]    Acid Fast Culture PENDING  Incomplete   Source (MTB RIF) EXPECTORATED SPUTUM  Final    Comment: Performed at [facility], [address] [place], [place] [address]  Aerobic/Anaerobic Culture w Gram Stain (surgical/deep wound)     Status: None   Collection Time: 03/08/23 12:58 PM   Specimen: Abscess  Result Value Ref Range Status   Specimen Description   Final    ABSCESS Performed at [facility], [address], [place], [place] [address]    Special Requests   Final    LEFT CHEST EMPYEMA Performed at [facility], [address] [place],  [place] [address]    Gram Stain   Final    ABUNDANT WBC PRESENT,BOTH PMN AND MONONUCLEAR NO ORGANISMS SEEN    Culture   Final    No growth aerobically or anaerobically. Performed at [facility], [address] [place], [place] [address]    Report Status 03/13/2023 FINAL  Final  MTB-RIF NAA with AFB Culture, sputum (q8 x 3)     Status: None (Preliminary result)   Collection Time: 03/08/23  1:15 PM   Specimen: Sputum  Result Value Ref Range Status   Myco tuberculosis Complex Comment NOT DETECTED Final    Comment: Mycobacterium tuberculosis complex (MTBC) NOT detected.   Rifampin Comment NOT DETECTED Final    Comment: (NOTE) Because Mycobacterium tuberculosis complex (MTBC) was not detected, no rifampin determination is possible.    AFB Specimen Processing Concentration  Final    Comment: (NOTE) Performed At: [facility] [address] [place], [place] [id number] Jolene Schimke MD KG:[id number]    Acid Fast Culture PENDING  Incomplete   Source (MTB RIF) SPUTUM  Final    Comment: Performed at [facility], [address] [place], [place] [address]   Imaging DG Chest [place] 1 View  Result Date: 03/11/2023 CLINICAL DATA:  Pleural effusion EXAM: PORTABLE CHEST 1 VIEW COMPARISON:  03/09/2023 FINDINGS: Unremarkable cardiac silhouette. Left base consolidation or volume loss and left-sided small pleural effusion with basilar chest tube in place. No pneumothorax. Right lung clear. Normal pulmonary vasculature. IMPRESSION: Small residual left-sided pleural effusion with a chest tube in place. No pneumothorax. Electronically Signed   By: Layla Maw M.D.   On: 03/11/2023 11:58   ECHOCARDIOGRAM COMPLETE  Result Date: 03/10/2023    ECHOCARDIOGRAM REPORT   Patient Name:   KEL AMADON Date of Exam: 03/10/2023 Medical Rec #:  [id number]    Height:       61.0 in Accession #:    [id number]   Weight:       104.9 lb Date of Birth:  10/09/1975    BSA:          1.436 m Patient Age:    47 years     BP:           116/74 mmHg Patient Gender: M            HR:           101 bpm. Exam Location:  [facility] Procedure: 2D Echo Indications:     Hypotension  History:         Patient has prior history of Echocardiogram examinations, most                  recent 09/21/2021.  Sonographer:     Overton Mam RDCS, FASE Referring Phys:  [id number] Wilfred Curtis SAYT Diagnosing Phys: Debbe Odea MD  Sonographer Comments: Technically difficult study due to poor echo windows. Image acquisition challenging due to respiratory motion. This was a very challenging study due to patient coughing and left side chest tube in place. IMPRESSIONS  1. Left ventricular ejection fraction, by estimation, is 60 to 65%. The left ventricle has normal function. The left ventricle has no regional wall motion abnormalities. Left ventricular diastolic  parameters were normal.  2. Right ventricular systolic function is normal. The right ventricular size is normal.  3. The mitral valve is normal in structure. Trivial mitral valve regurgitation.  4. The aortic valve is tricuspid. Aortic valve regurgitation is mild. Aortic valve sclerosis/calcification is  present, without any evidence of aortic stenosis.  5. The inferior vena cava is normal in size with greater than 50% respiratory variability, suggesting right atrial pressure of 3 mmHg. FINDINGS  Left Ventricle: Left ventricular ejection fraction, by estimation, is 60 to 65%. The left ventricle has normal function. The left ventricle has no regional wall motion abnormalities. The left ventricular internal cavity size was normal in size. There is  no left ventricular hypertrophy. Left ventricular diastolic parameters were normal. Right Ventricle: The right ventricular size is normal. No increase in right ventricular wall thickness. Right ventricular systolic function is normal. Left Atrium: Left atrial size was normal in size. Right Atrium: Right atrial size was normal in size. Pericardium: There is no evidence of pericardial effusion. Mitral Valve: The mitral valve is normal in structure. Trivial mitral valve regurgitation. Tricuspid Valve: The tricuspid valve is normal in structure. Tricuspid valve regurgitation is trivial. Aortic Valve: The aortic valve is tricuspid. Aortic valve regurgitation is mild. Aortic valve sclerosis/calcification is present, without any evidence of aortic stenosis. Aortic valve peak gradient measures 4.5 mmHg. Pulmonic Valve: The pulmonic valve was not well visualized. Pulmonic valve regurgitation is not visualized. Aorta: The aortic root and ascending aorta are structurally normal, with no evidence of dilitation. Venous: The inferior vena cava is normal in size with greater than 50% respiratory variability, suggesting right atrial pressure of 3 mmHg. IAS/Shunts: No atrial level shunt  detected by color flow Doppler.  LEFT VENTRICLE PLAX 2D LVIDd:         4.30 cm   Diastology LVIDs:         3.00 cm   LV e' medial:    13.60 cm/s LV PW:         0.90 cm   LV E/e' medial:  4.5 LV IVS:        1.00 cm   LV e' lateral:   14.80 cm/s LVOT diam:     1.80 cm   LV E/e' lateral: 4.2 LV SV:         39 LV SV Index:   27 LVOT Area:     2.54 cm  RIGHT VENTRICLE RV Basal diam:  2.30 cm RV S prime:     11.10 cm/s TAPSE (M-mode): 2.2 cm LEFT ATRIUM           Index       RIGHT ATRIUM          Index LA diam:      2.70 cm 1.88 cm/m  RA Area:     6.53 cm LA Vol (A4C): 8.9 ml  6.20 ml/m  RA Volume:   10.40 ml 7.24 ml/m  AORTIC VALVE AV Area (Vmax): 2.09 cm AV Vmax:        106.00 cm/s AV Peak Grad:   4.5 mmHg LVOT Vmax:      87.00 cm/s LVOT Vmean:     50.900 cm/s LVOT VTI:       0.155 m  AORTA Ao Root diam: 3.30 cm Ao Asc diam:  3.10 cm MITRAL VALVE MV Area (PHT): 4.06 cm    SHUNTS MV Decel Time: 187 msec    Systemic VTI:  0.16 m MV E velocity: 61.80 cm/s  Systemic Diam: 1.80 cm MV A velocity: 58.20 cm/s MV E/A ratio:  1.06 Debbe Odea MD Electronically signed by Debbe Odea MD Signature Date/Time: 03/10/2023/3:07:29 PM    Final       Time coordinating discharge: over 30 minutes  SIGNED:  Dorene Grebe  Sergio Hobart DO [facility]

## 2023-03-14 NOTE — Progress Notes (Signed)
PULMONOLOGY         Date: 03/14/2023,   MRN# [id number] Keith Parker [birth year]     AdmissionWeight: 46.3 kg                 CurrentWeight: 47.4 kg  Referring provider: Dr Mikey College   CHIEF COMPLAINT:   Hydropneumothorax   HISTORY OF PRESENT ILLNESS   Patient reports exposure to black mold "very heavy" appx 1-2 years ago.  He has lived in this house with mold for appx 3 years.  He smokes actively, he smokes tobacco and THC.  He denies any other substances but admits to drinking alcohol.  He drinks 40Oz. Of beer daily.    03/07/23- patient is able to eat and is breathing on room air. Reports cough is worse. He has IR consult for chest tube but shares procedure was postponed.  He is on antibiotics with zosyn and vanco.  MRSA is negative. Sputum culture is negative and procalcitonin is flat both indicative of possible cancer as main etiology.   03/08/23- patient for chest tube today. CRP remains elevated and he may still require throacic surgery evaluation   03/09/23- patient had no overnigth events.  He feels better.  Chest tube management with + air leak on forced expiratory maneuver.  He slept well after fluid has been aspirated.  03/10/23- patient reports less pain at left lung.  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  We have requested thoracic surgery eval routine consultation. Patient stable. No signs of alcohol withdrawal.    03/11/23- patient reports mild pain at left axilary lower lung zone. Repeat cxr in process today. Bloodwork is stable.  Infectious workup is all negative thus far.  Air leak still present but actually reduced.  Will continue current antimicrobials empirically  03/12/23- patient continues to improve.  CXR 03/11/23 with only minimal residual effusion. Thus far no evidence of active infection.  WBC normal since admission , procalcitonin is also normal and cultures are negative including respiratory sputum and pleural fluid from initial  aspiration of left thorax. Multiple AFB specimens are negative to date. He is ambulatory on room air. There is concern for neoplasm related lesion.  Today we will clamp tube and check for re-accumulation of fluid/air with repeat chest imaging in am.  IF all stable may remove  chest tube and prepare for dc home.    03/13/23- patient is feeling well on room air.  He feels close to baseline.  He had clamped chest tube overnight. Will ask IR consultatnt to remove pigtail if possible today to optmize for dc planning.   03/14/23- patient s/p chest tube removal.  No worsening overnight. Repeat CXR with stable findings.  Plan for dc home and outpatient follow up.    PAST MEDICAL HISTORY   Past Medical History:  Diagnosis Date   Allergy    Arthritis    Depression    Elevated liver enzymes 10/29/2017   GERD (gastroesophageal reflux disease)    Hearing loss, conductive, bilateral 12/31/2017   Hypertension    Polio    Seizures (HCC)    last seizure about 7 years ago.    Substance abuse (HCC)    Alcohol abuse     SURGICAL HISTORY   Past Surgical History:  Procedure Laterality Date   COLONOSCOPY N/A 06/15/2022   Procedure: COLONOSCOPY;  Surgeon: Wyline Mood, MD;  Location: [facility] ENDOSCOPY;  Service: Gastroenterology;  Laterality: N/A;   COLONOSCOPY WITH PROPOFOL N/A 07/13/2021  Procedure: COLONOSCOPY WITH PROPOFOL;  Surgeon: Wyline Mood, MD;  Location: [facility] ENDOSCOPY;  Service: Gastroenterology;  Laterality: N/A;   ESOPHAGOGASTRODUODENOSCOPY N/A 07/13/2021   Procedure: ESOPHAGOGASTRODUODENOSCOPY (EGD);  Surgeon: Wyline Mood, MD;  Location: [facility] ENDOSCOPY;  Service: Gastroenterology;  Laterality: N/A;   ESOPHAGOGASTRODUODENOSCOPY (EGD) WITH PROPOFOL N/A 10/13/2021   Procedure: ESOPHAGOGASTRODUODENOSCOPY (EGD) WITH PROPOFOL;  Surgeon: Wyline Mood, MD;  Location: [facility] ENDOSCOPY;  Service: Gastroenterology;  Laterality: N/A;   ESOPHAGOGASTRODUODENOSCOPY (EGD) WITH PROPOFOL N/A 11/08/2021   Procedure:  ESOPHAGOGASTRODUODENOSCOPY (EGD) WITH PROPOFOL;  Surgeon: Wyline Mood, MD;  Location: [facility] ENDOSCOPY;  Service: Gastroenterology;  Laterality: N/A;   ESOPHAGOGASTRODUODENOSCOPY (EGD) WITH PROPOFOL N/A 06/15/2022   Procedure: ESOPHAGOGASTRODUODENOSCOPY (EGD) WITH PROPOFOL;  Surgeon: Wyline Mood, MD;  Location: [facility] ENDOSCOPY;  Service: Gastroenterology;  Laterality: N/A;   IR RADIOLOGIST EVAL & MGMT  03/13/2023   LEG SURGERY  1988   8 surgeries-hips and legs for walking after getting polio- last one 1988   WRIST SURGERY     right     FAMILY HISTORY   Family History  Problem Relation Age of Onset   Cancer Mother        Breast   Arthritis Mother    Alcohol abuse Father    Arthritis Father    Hypertension Father    Cancer Maternal Grandmother        Breast   Hypertension Maternal Grandmother    Hypertension Maternal Grandfather    Hypertension Paternal Grandmother    Hypertension Paternal Grandfather    Drug abuse Maternal Uncle      SOCIAL HISTORY   Social History   Tobacco Use   Smoking status: Every Day    Current packs/day: 2.00    Average packs/day: 2.0 packs/day for 25.0 years (50.0 ttl pk-yrs)    Types: Cigarettes   Smokeless tobacco: Never  Vaping Use   Vaping status: Never Used  Substance Use Topics   Alcohol use: Yes    Alcohol/week: 70.0 standard drinks of alcohol    Types: 70 Cans of beer per week    Comment: 2 days ago   Drug use: No     MEDICATIONS    Home Medication:    Current Medication:  Current Facility-Administered Medications:    0.9 %  sodium chloride infusion, , Intravenous, PRN, Tresa Moore, MD, Stopped at 03/07/23 1818   acetaminophen (TYLENOL) tablet 500 mg, 500 mg, Oral, Q6H PRN, Wouk, Wilfred Curtis, MD, 500 mg at 03/13/23 2039   benzonatate (TESSALON) capsule 200 mg, 200 mg, Oral, BID, Mikey College T, MD, 200 mg at 03/13/23 2041   cyclobenzaprine (FLEXERIL) tablet 7.5 mg, 7.5 mg, Oral, TID PRN, Mikey College T, MD, 7.5 mg at  03/13/23 2039   enoxaparin (LOVENOX) injection 40 mg, 40 mg, Subcutaneous, Q24H, Mikey College T, MD, 40 mg at 03/13/23 1610   feeding supplement (ENSURE ENLIVE / ENSURE PLUS) liquid 237 mL, 237 mL, Oral, BID BM, Wouk, Wilfred Curtis, MD   ferrous sulfate tablet 325 mg, 325 mg, Oral, BID WC, Sreenath, Sudheer B, MD, 325 mg at 03/13/23 1622   folic acid (FOLVITE) tablet 1 mg, 1 mg, Oral, Daily, Mikey College T, MD, 1 mg at 03/13/23 9604   gabapentin (NEURONTIN) capsule 300 mg, 300 mg, Oral, TID, Mikey College T, MD, 300 mg at 03/13/23 2041   guaiFENesin (MUCINEX) 12 hr tablet 1,200 mg, 1,200 mg, Oral, BID, Mikey College T, MD, 1,200 mg at 03/13/23 2041   hydrOXYzine (ATARAX) tablet 50 mg, 50 mg,  Oral, Q8H PRN, Kathrynn Running, MD, 50 mg at 03/14/23 0118   lidocaine (PF) (XYLOCAINE) 1 % injection 10 mL, 10 mL, Intradermal, Once, Mugweru, Jon, MD   multivitamin with minerals tablet 1 tablet, 1 tablet, Oral, Daily, Mikey College T, MD, 1 tablet at 03/13/23 1610   nicotine (NICODERM CQ - dosed in mg/24 hours) patch 21 mg, 21 mg, Transdermal, Daily, Mikey College T, MD, 21 mg at 03/13/23 0854   Oral care mouth rinse, 15 mL, Mouth Rinse, PRN, Wouk, Wilfred Curtis, MD   oxyCODONE (Oxy IR/ROXICODONE) immediate release tablet 5 mg, 5 mg, Oral, Q6H PRN, Manuela Schwartz, NP, 5 mg at 03/13/23 2339   polyethylene glycol (MIRALAX / GLYCOLAX) packet 34 g, 34 g, Oral, Daily, Wouk, Wilfred Curtis, MD, 34 g at 03/12/23 1019   rifaximin (XIFAXAN) tablet 550 mg, 550 mg, Oral, BID, Mikey College T, MD, 550 mg at 03/13/23 2041   spiritus frumenti (ethyl alcohol) solution 1 each, 1 each, Oral, TID, Lolita Patella B, MD, 1 each at 03/13/23 2339   thiamine (VITAMIN B1) tablet 100 mg, 100 mg, Oral, Daily, 100 mg at 03/13/23 0852 **OR** thiamine (VITAMIN B1) injection 100 mg, 100 mg, Intravenous, Daily, Mikey College T, MD   traZODone (DESYREL) tablet 200 mg, 200 mg, Oral, QHS PRN, Mikey College T, MD, 200 mg at 03/07/23 2213    ALLERGIES    Patient has no known allergies.     REVIEW OF SYSTEMS    Review of Systems:  Gen:  Denies  fever, sweats, chills weigh loss  HEENT: Denies blurred vision, double vision, ear pain, eye pain, hearing loss, nose bleeds, sore throat Cardiac:  No dizziness, chest pain or heaviness, chest tightness,edema Resp:   reports dyspnea chronically  Gi: Denies swallowing difficulty, stomach pain, nausea or vomiting, diarrhea, constipation, bowel incontinence Gu:  Denies bladder incontinence, burning urine Ext:   Denies Joint pain, stiffness or swelling Skin: Denies  skin rash, easy bruising or bleeding or hives Endoc:  Denies polyuria, polydipsia , polyphagia or weight change Psych:   Denies depression, insomnia or hallucinations   Other:  All other systems negative   VS: BP 98/70 (BP Location: Left Arm)   Pulse 82   Temp 98 F (36.7 C) (Oral)   Resp 18   Ht 5\' 1"  (1.549 m)   Wt 47.4 kg   SpO2 99%   BMI 19.73 kg/m      PHYSICAL EXAM    GENERAL:NAD, no fevers, chills, no weakness no fatigue HEAD: Normocephalic, atraumatic.  EYES: Pupils equal, round, reactive to light. Extraocular muscles intact. No scleral icterus.  MOUTH: Moist mucosal membrane. Dentition intact. No abscess noted.  EAR, NOSE, THROAT: Clear without exudates. No external lesions.  NECK: Supple. No thyromegaly. No nodules. No JVD.  PULMONARY: decreased breath sounds with mild rhonchi worse at bases bilaterally.  CARDIOVASCULAR: S1 and S2. Regular rate and rhythm. No murmurs, rubs, or gallops. No edema. Pedal pulses 2+ bilaterally.  GASTROINTESTINAL: Soft, nontender, nondistended. No masses. Positive bowel sounds. No hepatosplenomegaly.  MUSCULOSKELETAL: No swelling, clubbing, or edema. Range of motion full in all extremities.  NEUROLOGIC: Cranial nerves II through XII are intact. No gross focal neurological deficits. Sensation intact. Reflexes intact.  SKIN: No ulceration, lesions, rashes, or cyanosis. Skin warm  and dry. Turgor intact.  PSYCHIATRIC: Mood, affect within normal limits. The patient is awake, alert and oriented x 3. Insight, judgment intact.       IMAGING  Impression  CLINICAL DATA:  Abnormal chest x-ray.  Lung mass.  Weight loss.   EXAM: CT CHEST WITH CONTRAST   TECHNIQUE: Multidetector CT imaging of the chest was performed during intravenous contrast administration.   RADIATION DOSE REDUCTION: This exam was performed according to the departmental dose-optimization program which includes automated exposure control, adjustment of the mA and/or kV according to patient size and/or use of iterative reconstruction technique.   CONTRAST:  75mL OMNIPAQUE IOHEXOL 300 MG/ML  SOLN   COMPARISON:  X-ray earlier 03/05/2023.  CT scan September 2015   FINDINGS: Cardiovascular: Heart is nonenlarged. No pericardial effusion. Normal caliber thoracic aorta.   Mediastinum/Nodes: No specific abnormal lymph node enlargement identified in the axillary regions. No abnormal nodes in the right hilum. There are some small left hilar and mediastinal nodes. Example left infrahilar region measures 15 x 7 mm. Precarinal node measures 8 mm in short axis on series 2, image 64. Normal caliber thoracic esophagus. Preserved thyroid gland.   Lungs/Pleura: Diffuse breathing motion. Right lung is grossly clear without consolidation, pneumothorax or effusion. There is a 3 mm right lower lobe lung nodule on series 2, image 91. Not clearly seen previously in 2015.   However the left lung has a area of consolidation left lower lobe with some air bronchograms, associated pleural thickening and a loculated pleural effusion with some air, loculated hydropneumothorax. Overall this measures a proximally 9.3 x 2.6 cm in the axial plane and extends cephalocaudal of up to 19 cm proximally. This extends up to a cavitary lesion in the posterior left upper lobe abutting the pleura with thick irregular  walls measuring 5.9 x 4.0 cm. Along the superior aspect of the consolidation left lower lobe is a nodular component extending into the superior segment and slightly crossing the interlobar fissure. This has a nodular component with spiculations on series 2, image 80 measures 2.0 by 1.7 cm.   There are some small nodules elsewhere in the left lung such as series 2, image 60 measuring 5 mm. Other foci on image 66.   Upper Abdomen: Adrenal glands are preserved in the upper abdomen. Stone in the nondilated gallbladder.   Musculoskeletal: Curvature of the thoracic spine with some mild degenerative changes.   IMPRESSION: 5.9 cm cavitary mass in the left upper lobe abutting the pleura with irregular and thickened margins. Associated loculated left hydropneumothorax extending down to the diaphragm with consolidative opacity in the left lower lobe.   Additional nodular component seen adjacent to the consolidation in the superior segment left lower lobe which may cross the pleura.   Overall differential would include atypical infection such as TB versus neoplasm amongst other differential. Recommend additional workup.   Few separate tiny lung nodules elsewhere and some prominent lymph nodes in the mediastinum and left hilum. Attention on short follow-up.   Gallstones.     Electronically Signed   By: Karen Kays M.D.   On: 03/05/2023 16:54    ASSESSMENT/PLAN   Left hydropneumothorax - patient does have some risk factors for TB including alcoholism and sharing a home with many people who come and go.   -He has smoked his entire life and also has malignancy risk  -he has never had cancer but does have family history including mother with breast cancer and grandmother with breast cancer and uncle with some kind of cancer. - patient would benefit from IR consultation and possible pleural drain.  He may need thoracic surgery evaluation.  -He has lost appx 40 lbs  and is currently less  then 100lbs with BMI <20.  This may be due to alcoholism vs cancer -He has hyponatremia and reports disequlibrium. 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  -currently he remain in empiric on Zosyn and Vancomycin IV with phramacy consultation -serum fungitell -legionella ab -strep pneumoniae ur AG -Histoplasma Ur Ag -sputum resp cultures -AFB sputum expectorated specimen -sputum cytology  -reviewed pertinent imaging with patient today - ESR/CRP/procalcitonin/MRSA PCR -please encourage patient to use incentive spirometer few times each hour while hospitalized.       Left chest parapneumonic effusion/empyema    - currently there is no bacteria growing in culture   - there is fibrinous debris with pus consistent with empyema   - he is coughing up more mucopurulent material    -he has completed zosyn IV and now on augmentin   - his wbc count is improved   - repeat cxr today   - may need thoracic surgery evaluation       Thank you for allowing me to participate in the care of this patient.   Patient/Family are satisfied with care plan and all questions have been answered.    Provider disclosure: Patient with at least one acute or chronic illness or injury that poses a threat to life or bodily function and is being managed actively during this encounter.  All of the below services have been performed independently by signing provider:  review of prior documentation from internal and or external health records.  Review of previous and current lab results.  Interview and comprehensive assessment during patient visit today. Review of current and previous chest radiographs/CT scans. Discussion of management and test interpretation with health care team and patient/family.   This document was prepared using Dragon voice recognition software and may include unintentional dictation errors.     Vida Rigger, M.D.  Division of  Pulmonary & Critical Care Medicine

## 2023-03-14 NOTE — Plan of Care (Signed)

## 2023-03-14 NOTE — Hospital Course (Signed)
Keith Parker is a 47 y.o. male with medical history significant of alcoholic liver cirrhosis, polio with chronic right leg pain, anxiety/depression, presented with worsening of productive cough, loss of appetite, malaise and weight loss, starting 6 mos ago productive cough with thick brownish sputum, night> day, associated with loss of appetite and malaise.  He does not see any blood streaks in the sputum.  Denies any fever chills no night sweat.  He lost about 25 pounds from 120 to 95 pounds in 6 months.  Yesterday he went to see PCP who recommended patient come to ED for evaluation.  09/09:  CT chest 5.9 cm cavity mass in the left upper lobe with irregular and thickened margins associated with loculated left hydropneumothorax, consolidated opacity in the left lower lobe.  Lab work hemoglobin 9.2, WBC 6.4. admitted to hospitalist service.  09/10: beginning infectious w/u and continue abx 09/11: IR consult for chest tube but procedure was postponed.  He is on antibiotics with zosyn and vanco.  MRSA negative. Sputum culture is negative and procalcitonin is flat.  09/12: chest tube today. CRP remains elevated and he may still require throacic surgery evaluation   09/13: Chest tube management with + air leak on forced expiratory maneuver, fluid has been aspirated. 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   09/15: Air leak still present but actually reduced.  Plan continue current antimicrobials empirically. CXR  with only minimal residual effusion.  09/16:  infectious workup is all negative thus far.concern for neoplasm related lesion. Today clamp tube and check for re-accumulation of fluid/air with repeat chest imaging in am.  IF all stable may remove  chest tube and prepare for dc home.   09/17: patient is feeling well on room air.  He feels close to baseline.  He had clamped chest tube overnight. IR to remove pigtail if possible  today to optmize for dc planning     Consultants:  Pulmonology Interventional Radiology   Procedures: Chest tube placement       ASSESSMENT & PLAN:   Principal Problem:   Cavitary pneumonia Active Problems:   Alcoholic cirrhosis of liver with ascites (HCC)   Weight loss   Lobar pneumonia (HCC)   Hydropneumothorax   Protein-calorie malnutrition, severe  Left lung cavitary pneumonia Bronchopleural fistula Hydropneumothorax Infection and malignancy both on differential, unfortunately malignancy seems more likely. Pulmonary following.  Interventional radiology engaged. Plan: IV Zosyn transitioned to augmentin by pulm TB, Legionella, Strep pneumo urinary antigen all neg Sputum respiratory culture normal growth Fluid culture negative Chest tube inserted 09/12, removed 09/17 Will check cxr , if clear can likely d/c possibly w/ abx with plan for outpatient pulm f/u.   Hypotension Improved, lactate normalized. Stable now off fluids   Hyponatremia Resolved with fluids   Anemia, normocytic Iron deficiency anemia Iron indices low. Hgb stable in the ~8s  Plan: Received IV Venofer 300 mg x 1   Liver cirrhosis -Blood pressure borderline low probably secondary to chronic poor nutrition and weight loss, hold off on Lasix and Aldactone -Continue rifaximin   Alcohol abuse Patient drinks approximately 40 ounces alcohol per day.  Not interested in quitting at this time. No s/s withdrawal. Hcv/hiv neg Plan: Will order patient beer in the hospital   Tobacco abuse Nicotine patch     Underweight w/ concern for malnutrition based on BMI: Body mass index is 19.73 kg/m.   DVT prophylaxis: *** IV fluids: *** continuous IV fluids  Nutrition: *** Central lines / invasive devices: ***  Code Status: *** ACP documentation reviewed: *** none on file in [facility]  TOC needs: *** Barriers to dispo / significant pending items: ***

## 2023-03-15 ENCOUNTER — Ambulatory Visit: Payer: Medicare Other | Admitting: Gastroenterology

## 2023-04-20 LAB — ACID FAST CULTURE WITH REFLEXED SENSITIVITIES (MYCOBACTERIA): Acid Fast Culture: NEGATIVE

## 2023-04-22 LAB — MTB-RIF NAA WITH AFB CULTURE, SPUTUM
Acid Fast Culture: NEGATIVE
Acid Fast Culture: NEGATIVE

## 2023-04-22 LAB — ACID FAST CULTURE WITH REFLEXED SENSITIVITIES (MYCOBACTERIA): Acid Fast Culture: NEGATIVE

## 2023-04-24 ENCOUNTER — Other Ambulatory Visit: Payer: Self-pay | Admitting: Pulmonary Disease

## 2023-04-24 DIAGNOSIS — R918 Other nonspecific abnormal finding of lung field: Secondary | ICD-10-CM

## 2023-04-26 ENCOUNTER — Encounter: Payer: Self-pay | Admitting: Gastroenterology

## 2023-04-26 ENCOUNTER — Ambulatory Visit
Admission: RE | Admit: 2023-04-26 | Discharge: 2023-04-26 | Disposition: A | Discharge status: Home / Self Care | Payer: Medicare Other | Source: Ambulatory Visit | Attending: Pulmonary Disease | Admitting: Pulmonary Disease

## 2023-04-26 ENCOUNTER — Ambulatory Visit: Payer: Medicare Other | Admitting: Gastroenterology

## 2023-04-26 ENCOUNTER — Other Ambulatory Visit: Payer: Self-pay

## 2023-04-26 VITALS — BP 130/88 | HR 98 | Temp 97.8°F | Wt 114.0 lb

## 2023-04-26 DIAGNOSIS — Z8719 Personal history of other diseases of the digestive system: Secondary | ICD-10-CM

## 2023-04-26 DIAGNOSIS — F17210 Nicotine dependence, cigarettes, uncomplicated: Secondary | ICD-10-CM

## 2023-04-26 DIAGNOSIS — R918 Other nonspecific abnormal finding of lung field: Secondary | ICD-10-CM | POA: Diagnosis present

## 2023-04-26 DIAGNOSIS — F1010 Alcohol abuse, uncomplicated: Secondary | ICD-10-CM | POA: Diagnosis not present

## 2023-04-26 DIAGNOSIS — K7031 Alcoholic cirrhosis of liver with ascites: Secondary | ICD-10-CM

## 2023-04-26 NOTE — Progress Notes (Signed)
Wyline Mood MD, MRCP(U.K) [address]  [address]  [place], [place] [address]  Main: [phone]  Fax: [phone]   Primary Care Physician: Leanna Sato, MD  Primary Gastroenterologist:  Dr. Wyline Mood   Chief Complaint  Patient presents with   Cirrhosis    HPI: Keith Parker is a 47 y.o. 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  April 2023 esophageal varices banded nonbleeding,   He also underwent a colonoscopy on the same day and 2 polyps were found in the rectum that were resected.  Bowel prep was suboptimal ,  polyps were adenomas.  He has history of polysubstance abuse chronic pain. Autoimmune screen was negative .  History of NSAID use that he stopped and ongoing alcohol use.Marland Kitchen  History of abdominal ascites requiring abdominal paracentesis    09/22/2021-creatinine 0.42 albumin 1.9 AST 59 ALT 32 alkaline phosphatase 191 total bilirubin 1.4 10/10/2021: Ceruloplasmin - normal.  Ferritin lower at 511 and normal iron saturation , Hep C ab negative. Cr 0.55 10/13/2021: EGD: PHG noted, 2 columns of varices seen and 2 bands placed.   11/03/2021: EGD: Moderate portal hypertensive gastropathy was seen no esophageal varices that required banding was noted.     Interval history  05/15/2022-04/26/2023 05/09/2022 at [facility] ultrasound shows cirrhosis no liver mass Following with pulmonary for lung cavitary mass right lower lobe.  Undergoing lung biopsy and EBUS  03/12/2023 hemoglobin 7.9 g MCV 91.3, Creatinine 0.61  Weight stable since last visit pulse 69 bpm taking nadolol 20 mg twice daily.  Taking Aldactone 100 mg a day and Lasix 20 mg a day.  Diet is still high in sodium.  No NSAIDs.  Having difficulty sleeping at night on Xifaxan twice a day denies any constipation has a bowel movement soft every day.  Continues to drink 1 drink of alcohol a day    Continues to smoke cigarettes and drink alcohol on and off on Aldactone and Lasix  Current Outpatient Medications  Medication Sig Dispense Refill   benzonatate (TESSALON) 200 MG capsule Take 1 capsule (200 mg total) by mouth 2 (two) times daily as needed for cough. 60 capsule 0   ferrous sulfate 325 (65 FE) MG tablet Take 1 tablet (325 mg total) by mouth daily with breakfast. 30 tablet 0   folic acid (FOLVITE) 1 MG tablet Take 1 tablet (1 mg total) by mouth daily. 30 tablet 0   furosemide (LASIX) 20 MG tablet Take 0.5 tablets (10 mg total) by mouth daily.     gabapentin (NEURONTIN) 300 MG capsule Take 300 mg by mouth 3 (three) times daily.     guaiFENesin (MUCINEX) 600 MG 12 hr tablet Take 2 tablets (1,200 mg total) by mouth 2 (two) times daily as needed for to loosen phlegm or cough. 60 tablet 0   hydrOXYzine (ATARAX) 25 MG tablet Take 1 tablet (25 mg total) by mouth every 8 (eight) hours as needed for anxiety. 15 tablet 0   mirtazapine (REMERON) 15 MG tablet Take 15 mg by mouth at bedtime.     Multiple Vitamin (MULTIVITAMIN WITH MINERALS) TABS tablet Take 1 tablet by mouth daily.     nicotine (NICODERM CQ - DOSED IN MG/24 HOURS) 21 mg/24hr patch Place 1 patch (21 mg total) onto the skin daily. 28 patch 0   oxyCODONE (OXY IR/ROXICODONE) 5 MG  immediate release tablet Take 1 tablet (5 mg total) by mouth every 6 (six) hours as needed for severe pain. 15 tablet 0   polyethylene glycol (MIRALAX / GLYCOLAX) 17 g packet Take 34 g by mouth daily as needed for mild constipation or moderate constipation.     spironolactone (ALDACTONE) 25 MG tablet Take 4 tablets (100 mg total) by mouth daily. 30 tablet 0   thiamine (VITAMIN B-1) 100 MG tablet Take 1 tablet (100 mg total) by mouth daily. 30 tablet 0   traZODone (DESYREL) 100 MG tablet Take 200 mg by mouth at bedtime as needed.     XIFAXAN 550 MG TABS tablet Take 550 mg by mouth 2 (two) times daily.     No current facility-administered medications for  this visit.    Allergies as of 04/26/2023   (No Known Allergies)     ROS:  General: Negative for anorexia, weight loss, fever, chills, fatigue, weakness. ENT: Negative for hoarseness, difficulty swallowing , nasal congestion. CV: Negative for chest pain, angina, palpitations, dyspnea on exertion, peripheral edema.  Respiratory: Negative for dyspnea at rest, dyspnea on exertion, cough, sputum, wheezing.  GI: See history of present illness. GU:  Negative for dysuria, hematuria, urinary incontinence, urinary frequency, nocturnal urination.  Endo: Negative for unusual weight change.    Physical Examination:   BP 130/83   Pulse (!) 110   Temp 97.8 F (36.6 C) (Oral)   Wt 114 lb (51.7 kg)   BMI 21.54 kg/m   General: Well-nourished, well-developed in no acute distress.  Eyes: No icterus. Conjunctivae pink. Mouth: Oropharyngeal mucosa moist and pink , no lesions erythema or exudate. Lungs: Clear to auscultation bilaterally. Non-labored. Heart: Regular rate and rhythm, no murmurs rubs or gallops.  Abdomen: Bowel sounds are normal, nontender, nondistended, no hepatosplenomegaly or masses, no abdominal bruits or hernia , no rebound or guarding.   Extremities: No lower extremity edema. No clubbing or deformities. Neuro: Alert and oriented x 3.  Grossly intact. Skin: Warm and dry, no jaundice.   Psych: Alert and cooperative, normal mood and affect.   Imaging Studies: No results found.  Assessment and Plan:   Keith Parker is a 47 y.o. y/o male with a history of chronic liver disease attributed to chronic alcohol abuse likely has cirrhosis with ascites and esophageal varices that have been banded and no history of bleeding.  Hospitalized in April 2023 for fluid accumulation and had paracentesis.  Doing very well on Aldactone and Lasix.  MELD sodium 11 in April 2023      Plan 1.  Low-salt diet, strongly advised to stop drinking alcohol and stop smoking. 2.  RUQ USG to [facility] for [facility]  overdue, check AFP 3.  No NSAID. 4.  EGD to evaluate for esophageal varices on nadolol 5.  Had first dose of hep A B vaccine on 10/10/2021.  Continue to complete series.  Recommend pneumococcal vaccine as well 6.  Advised to stop smoking 8.  Colonoscopy required once he has recovered from his lung issues.  We will address it at his follow-up visit 9.  Recheck CMP and INR  I have discussed alternative options, risks & benefits,  which include, but are not limited to, bleeding, infection, perforation,respiratory complication & drug reaction.  The patient agrees with this plan & written consent will be obtained.     Dr Wyline Mood  MD,MRCP [facility]) Follow up in 6 months    The end of his office visit we rechecked his heart  rate it was still elevated 110 waited some time longer and rechecked it it was still over 100 he states that he feels his heart racing but does not have any chest pain or shortness of breath or any other complaints.  We offered to call EMS but he did not want [place] to call EMS he said he is going to make his way to the ER he has a person to take him along with him right now and he will be going.  We will inform ER

## 2023-04-26 NOTE — Patient Instructions (Addendum)
Please arrive at the medical mall at 8:15 AM on 05/03/2023. Please do not eat or drink after midnight the night before your ultrasound. If this date and time does not work for you, please call [phone] option 3 and then option 2 to reschedule.  Called patient and asked to come back to the clinic so we could recheck his pulse since he was tachycardic when he was here for his appointment.

## 2023-04-27 ENCOUNTER — Encounter
Admission: RE | Disposition: A | Discharge status: Home / Self Care | Payer: Self-pay | Source: Home / Self Care | Attending: Pulmonary Disease

## 2023-04-27 ENCOUNTER — Ambulatory Visit: Payer: Medicare Other

## 2023-04-27 ENCOUNTER — Ambulatory Visit: Payer: Medicare Other | Admitting: Certified Registered"

## 2023-04-27 ENCOUNTER — Other Ambulatory Visit: Payer: Medicare Other

## 2023-04-27 ENCOUNTER — Other Ambulatory Visit: Payer: Self-pay

## 2023-04-27 ENCOUNTER — Ambulatory Visit
Admission: RE | Admit: 2023-04-27 | Discharge: 2023-04-27 | Disposition: A | Discharge status: Home / Self Care | Payer: Medicare Other | Attending: Pulmonary Disease | Admitting: Pulmonary Disease

## 2023-04-27 DIAGNOSIS — R9431 Abnormal electrocardiogram [ECG] [EKG]: Secondary | ICD-10-CM | POA: Diagnosis not present

## 2023-04-27 DIAGNOSIS — R590 Localized enlarged lymph nodes: Secondary | ICD-10-CM | POA: Insufficient documentation

## 2023-04-27 DIAGNOSIS — J449 Chronic obstructive pulmonary disease, unspecified: Secondary | ICD-10-CM | POA: Diagnosis not present

## 2023-04-27 DIAGNOSIS — I10 Essential (primary) hypertension: Secondary | ICD-10-CM | POA: Insufficient documentation

## 2023-04-27 DIAGNOSIS — F17210 Nicotine dependence, cigarettes, uncomplicated: Secondary | ICD-10-CM | POA: Diagnosis not present

## 2023-04-27 HISTORY — PX: VIDEO BRONCHOSCOPY WITH ENDOBRONCHIAL ULTRASOUND: SHX6177

## 2023-04-27 LAB — CBC WITH DIFFERENTIAL/PLATELET
Abs Immature Granulocytes: 0.02 10*3/uL (ref 0.00–0.07)
Basophils Absolute: 0.1 10*3/uL (ref 0.0–0.1)
Basophils Relative: 1 %
Eosinophils Absolute: 0.2 10*3/uL (ref 0.0–0.5)
Eosinophils Relative: 3 %
HCT: 39.7 % (ref 39.0–52.0)
Hemoglobin: 13.7 g/dL (ref 13.0–17.0)
Immature Granulocytes: 0 %
Lymphocytes Relative: 29 %
Lymphs Abs: 2.2 10*3/uL (ref 0.7–4.0)
MCH: 30.8 pg (ref 26.0–34.0)
MCHC: 34.5 g/dL (ref 30.0–36.0)
MCV: 89.2 fL (ref 80.0–100.0)
Monocytes Absolute: 0.6 10*3/uL (ref 0.1–1.0)
Monocytes Relative: 8 %
Neutro Abs: 4.5 10*3/uL (ref 1.7–7.7)
Neutrophils Relative %: 59 %
Platelets: 195 10*3/uL (ref 150–400)
RBC: 4.45 MIL/uL (ref 4.22–5.81)
RDW: 13.8 % (ref 11.5–15.5)
WBC: 7.6 10*3/uL (ref 4.0–10.5)
nRBC: 0 % (ref 0.0–0.2)

## 2023-04-27 LAB — COMPREHENSIVE METABOLIC PANEL
ALT: 19 [IU]/L (ref 0–44)
AST: 31 [IU]/L (ref 0–40)
Albumin: 4.2 g/dL (ref 4.1–5.1)
Alkaline Phosphatase: 154 [IU]/L — ABNORMAL HIGH (ref 44–121)
BUN/Creatinine Ratio: 6 — ABNORMAL LOW (ref 9–20)
BUN: 4 mg/dL — ABNORMAL LOW (ref 6–24)
Bilirubin Total: <0.2 mg/dL (ref 0.0–1.2)
CO2: 22 mmol/L (ref 20–29)
Calcium: 9.5 mg/dL (ref 8.7–10.2)
Chloride: 99 mmol/L (ref 96–106)
Creatinine, Ser: 0.66 mg/dL — ABNORMAL LOW (ref 0.76–1.27)
Globulin, Total: 3 g/dL (ref 1.5–4.5)
Glucose: 83 mg/dL (ref 70–99)
Potassium: 4.7 mmol/L (ref 3.5–5.2)
Sodium: 138 mmol/L (ref 134–144)
Total Protein: 7.2 g/dL (ref 6.0–8.5)
eGFR: 115 mL/min/{1.73_m2} (ref >59)

## 2023-04-27 LAB — PROTIME-INR
INR: 1 (ref 0.9–1.2)
Prothrombin Time: 11.4 s (ref 9.1–12.0)

## 2023-04-27 SURGERY — BRONCHOSCOPY, WITH EBUS
Anesthesia: General

## 2023-04-27 MED ORDER — MIDAZOLAM HCL 2 MG/2ML IJ SOLN
INTRAMUSCULAR | Status: AC
Start: 2023-04-27 — End: ?
  Filled 2023-04-27: qty 2

## 2023-04-27 MED ORDER — LIDOCAINE HCL (PF) 2 % IJ SOLN
INTRAMUSCULAR | Status: AC
  Filled 2023-04-27: qty 5

## 2023-04-27 MED ORDER — CHLORHEXIDINE GLUCONATE 0.12 % MT SOLN
15.0000 mL | Freq: Once | OROMUCOSAL | Status: DC
Start: 2023-04-27 — End: 2023-04-27

## 2023-04-27 MED ORDER — LIDOCAINE HCL (CARDIAC) PF 100 MG/5ML IV SOSY
PREFILLED_SYRINGE | INTRAVENOUS | Status: DC | PRN
  Administered 2023-04-27: 100 mg via INTRAVENOUS

## 2023-04-27 MED ORDER — OXYCODONE HCL 5 MG/5ML PO SOLN: 5.0000 mg | Freq: Once | ORAL | Status: DC | PRN

## 2023-04-27 MED ORDER — ONDANSETRON HCL 4 MG/2ML IJ SOLN
INTRAMUSCULAR | Status: AC
  Filled 2023-04-27: qty 2

## 2023-04-27 MED ORDER — SUCCINYLCHOLINE CHLORIDE 200 MG/10ML IV SOSY
PREFILLED_SYRINGE | INTRAVENOUS | Status: AC
  Filled 2023-04-27: qty 10

## 2023-04-27 MED ORDER — PHENYLEPHRINE 80 MCG/ML (10ML) SYRINGE FOR IV PUSH (FOR BLOOD PRESSURE SUPPORT)
PREFILLED_SYRINGE | INTRAVENOUS | Status: DC | PRN
  Administered 2023-04-27 (×5): 80 ug via INTRAVENOUS

## 2023-04-27 MED ORDER — FENTANYL CITRATE (PF) 100 MCG/2ML IJ SOLN: 25.0000 ug | INTRAMUSCULAR | Status: DC | PRN

## 2023-04-27 MED ORDER — FENTANYL CITRATE (PF) 100 MCG/2ML IJ SOLN
INTRAMUSCULAR | Status: DC | PRN
  Administered 2023-04-27 (×2): 50 ug via INTRAVENOUS

## 2023-04-27 MED ORDER — ROCURONIUM BROMIDE 10 MG/ML (PF) SYRINGE
PREFILLED_SYRINGE | INTRAVENOUS | Status: AC
  Filled 2023-04-27: qty 10

## 2023-04-27 MED ORDER — ONDANSETRON HCL 4 MG/2ML IJ SOLN: 4.0000 mg | Freq: Once | INTRAMUSCULAR | Status: DC | PRN

## 2023-04-27 MED ORDER — SUGAMMADEX SODIUM 200 MG/2ML IV SOLN
INTRAVENOUS | Status: DC | PRN
  Administered 2023-04-27: 200 mg via INTRAVENOUS

## 2023-04-27 MED ORDER — MIDAZOLAM HCL 2 MG/2ML IJ SOLN
INTRAMUSCULAR | Status: DC | PRN
  Administered 2023-04-27: 2 mg via INTRAVENOUS

## 2023-04-27 MED ORDER — ORAL CARE MOUTH RINSE
15.0000 mL | Freq: Once | OROMUCOSAL | Status: DC
Start: 2023-04-27 — End: 2023-04-27

## 2023-04-27 MED ORDER — PROPOFOL 10 MG/ML IV BOLUS
INTRAVENOUS | Status: DC | PRN
  Administered 2023-04-27: 150 mg via INTRAVENOUS

## 2023-04-27 MED ORDER — GLYCOPYRROLATE 0.2 MG/ML IJ SOLN
INTRAMUSCULAR | Status: AC
  Filled 2023-04-27: qty 1

## 2023-04-27 MED ORDER — DEXAMETHASONE SODIUM PHOSPHATE 10 MG/ML IJ SOLN
INTRAMUSCULAR | Status: AC
  Filled 2023-04-27: qty 1

## 2023-04-27 MED ORDER — ROCURONIUM BROMIDE 100 MG/10ML IV SOLN
INTRAVENOUS | Status: DC | PRN
  Administered 2023-04-27: 50 mg via INTRAVENOUS
  Administered 2023-04-27: 20 mg via INTRAVENOUS

## 2023-04-27 MED ORDER — ONDANSETRON HCL 4 MG/2ML IJ SOLN
INTRAMUSCULAR | Status: DC | PRN
  Administered 2023-04-27: 4 mg via INTRAVENOUS

## 2023-04-27 MED ORDER — MIDAZOLAM HCL 2 MG/2ML IJ SOLN
INTRAMUSCULAR | Status: AC
  Filled 2023-04-27: qty 2

## 2023-04-27 MED ORDER — DEXAMETHASONE SODIUM PHOSPHATE 10 MG/ML IJ SOLN
INTRAMUSCULAR | Status: DC | PRN
  Administered 2023-04-27: 10 mg via INTRAVENOUS

## 2023-04-27 MED ORDER — GLYCOPYRROLATE 0.2 MG/ML IJ SOLN
INTRAMUSCULAR | Status: DC | PRN
Start: 2023-04-27 — End: 2023-04-27
  Administered 2023-04-27: .2 mg via INTRAVENOUS

## 2023-04-27 MED ORDER — PROPOFOL 10 MG/ML IV BOLUS
INTRAVENOUS | Status: AC
  Filled 2023-04-27: qty 20

## 2023-04-27 MED ORDER — FENTANYL CITRATE (PF) 100 MCG/2ML IJ SOLN
INTRAMUSCULAR | Status: AC
  Filled 2023-04-27: qty 2

## 2023-04-27 MED ORDER — LACTATED RINGERS IV SOLN: INTRAVENOUS | Status: DC

## 2023-04-27 MED ORDER — OXYCODONE HCL 5 MG PO TABS: 5.0000 mg | ORAL_TABLET | Freq: Once | ORAL | Status: DC | PRN

## 2023-04-27 MED ORDER — CHLORHEXIDINE GLUCONATE 0.12 % MT SOLN
OROMUCOSAL | Status: AC
  Filled 2023-04-27: qty 15

## 2023-04-27 NOTE — Anesthesia Postprocedure Evaluation (Signed)
Anesthesia Post Note  Patient: Keith Parker  Procedure(s) Performed: VIDEO BRONCHOSCOPY WITH ENDOBRONCHIAL ULTRASOUND ROBOTIC ASSISTED NAVIGATIONAL BRONCHOSCOPY  Patient location during evaluation: PACU Anesthesia Type: General Level of consciousness: awake and awake and alert Pain management: satisfactory to patient Vital Signs Assessment: post-procedure vital signs reviewed and stable Respiratory status: spontaneous breathing Cardiovascular status: stable Anesthetic complications: no   No notable events documented.   Last Vitals:  Vitals:   04/27/23 1045 04/27/23 1100  BP: 106/82 124/85  Pulse: 99 84  Resp: 15 15  Temp: 36.7 C   SpO2: 100% 95%    Last Pain:  Vitals:   04/27/23 1100  PainSc: 0-No pain                 VAN STAVEREN,Yosselyn Tax

## 2023-04-27 NOTE — H&P (Signed)
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  He has had lifelong smoking history and has moderate to high probability of bronchogenic carcinoma.  He had admission in Sep 2024 with findings of poss empyema/pna vs post obstructive pna with lung mass.  This was drained and treated and he is improved but contiues to have have consittutional symptoms including weight loss, severe fatigue, subjective diaphoresis and overall feeling sick still.  Today were planning to perform bronchoscopy with airway inspection, flexible bronch, thereaputic aspiration , BAL, EBUS, robotic bronch with lung biopsy.  We reviewed imaging together, he already had pneumothorax on left and continues to have what appears to be left ex vacuo pneumothorax.  He understands that he is high risk but wants this done for definitive diagnsosis and treatment.  He is fully independent and lucid.   -Reviewed risks/complications and benefits with patient, risks include infection, pneumothorax/pneumomediastinum which may require chest tube placement as well as overnight/prolonged hospitalization and possible mechanical ventilation. Other risks include bleeding and very rarely death.  Patient understands risks and wishes to proceed.  Additional questions were answered, and patient is aware that post procedure patient will be going home with family and may experience cough with possible clots on expectoration as well as phlegm which may last few days as well as hoarseness of voice post intubation and mechanical ventilation.    PAST MEDICAL HISTORY   Past Medical History:  Diagnosis Date   Allergy    Arthritis    Depression    Elevated liver enzymes 10/29/2017   GERD (gastroesophageal reflux  disease)    Hearing loss, conductive, bilateral 12/31/2017   Hypertension    Polio    Seizures (HCC)    last seizure about 7 years ago.    Substance abuse (HCC)    Alcohol abuse     SURGICAL HISTORY   Past Surgical History:  Procedure Laterality Date   COLONOSCOPY N/A 06/15/2022   Procedure: COLONOSCOPY;  Surgeon: Wyline Mood, MD;  Location: [facility] ENDOSCOPY;  Service: Gastroenterology;  Laterality: N/A;   COLONOSCOPY WITH PROPOFOL N/A 07/13/2021   Procedure: COLONOSCOPY WITH PROPOFOL;  Surgeon: Wyline Mood, MD;  Location: [facility] ENDOSCOPY;  Service: Gastroenterology;  Laterality: N/A;   ESOPHAGOGASTRODUODENOSCOPY N/A 07/13/2021   Procedure: ESOPHAGOGASTRODUODENOSCOPY (EGD);  Surgeon: Wyline Mood, MD;  Location: [facility] ENDOSCOPY;  Service: Gastroenterology;  Laterality: N/A;   ESOPHAGOGASTRODUODENOSCOPY (EGD) WITH PROPOFOL N/A 10/13/2021   Procedure: ESOPHAGOGASTRODUODENOSCOPY (EGD) WITH PROPOFOL;  Surgeon: Wyline Mood, MD;  Location: [facility] ENDOSCOPY;  Service: Gastroenterology;  Laterality: N/A;   ESOPHAGOGASTRODUODENOSCOPY (EGD) WITH PROPOFOL N/A 11/08/2021   Procedure: ESOPHAGOGASTRODUODENOSCOPY (EGD) WITH PROPOFOL;  Surgeon: Wyline Mood, MD;  Location: [facility] ENDOSCOPY;  Service: Gastroenterology;  Laterality: N/A;   ESOPHAGOGASTRODUODENOSCOPY (EGD) WITH PROPOFOL N/A 06/15/2022   Procedure: ESOPHAGOGASTRODUODENOSCOPY (EGD) WITH PROPOFOL;  Surgeon: Wyline Mood, MD;  Location: [facility] ENDOSCOPY;  Service: Gastroenterology;  Laterality: N/A;   IR RADIOLOGIST EVAL & MGMT  03/13/2023   LEG SURGERY  1988   8 surgeries-hips and legs for walking after getting polio- last one 1988   WRIST SURGERY  right     FAMILY HISTORY   Family History  Problem Relation Age of Onset   Cancer Mother        Breast   Arthritis Mother    Alcohol abuse Father    Arthritis Father    Hypertension Father    Cancer Maternal Grandmother        Breast   Hypertension Maternal Grandmother    Hypertension Maternal  Grandfather    Hypertension Paternal Grandmother    Hypertension Paternal Grandfather    Drug abuse Maternal Uncle      SOCIAL HISTORY   Social History   Tobacco Use   Smoking status: Every Day    Current packs/day: 2.00    Average packs/day: 2.0 packs/day for 25.0 years (50.0 ttl pk-yrs)    Types: Cigarettes   Smokeless tobacco: Never  Vaping Use   Vaping status: Never Used  Substance Use Topics   Alcohol use: Yes    Alcohol/week: 70.0 standard drinks of alcohol    Types: 70 Cans of beer per week    Comment: 2 days ago   Drug use: No     MEDICATIONS    Home Medication:    Current Medication:  Current Facility-Administered Medications:    chlorhexidine (PERIDEX) 0.12 % solution 15 mL, 15 mL, Mouth/Throat, Once **OR** Oral care mouth rinse, 15 mL, Mouth Rinse, Once, Lenard Simmer, MD   lactated ringers infusion, , Intravenous, Continuous, Lenard Simmer, MD    ALLERGIES   Patient has no known allergies.     REVIEW OF SYSTEMS    Review of Systems:  Gen:  Denies  fever, sweats, chills weigh loss  HEENT: Denies blurred vision, double vision, ear pain, eye pain, hearing loss, nose bleeds, sore throat Cardiac:  No dizziness, chest pain or heaviness, chest tightness,edema Resp:   reports dyspnea chronically  Gi: Denies swallowing difficulty, stomach pain, nausea or vomiting, diarrhea, constipation, bowel incontinence Gu:  Denies bladder incontinence, burning urine Ext:   Denies Joint pain, stiffness or swelling Skin: Denies  skin rash, easy bruising or bleeding or hives Endoc:  Denies polyuria, polydipsia , polyphagia or weight change Psych:   Denies depression, insomnia or hallucinations   Other:  All other systems negative   VS: Pulse 94   Temp 98.5 F (36.9 C)   Resp 17   SpO2 100%      PHYSICAL EXAM    GENERAL:NAD, no fevers, chills, no weakness no fatigue HEAD: Normocephalic, atraumatic.  EYES: Pupils equal, round, reactive to light.  Extraocular muscles intact. No scleral icterus.  MOUTH: Moist mucosal membrane. Dentition intact. No abscess noted.  EAR, NOSE, THROAT: Clear without exudates. No external lesions.  NECK: Supple. No thyromegaly. No nodules. No JVD.  PULMONARY: decreased breath sounds with mild rhonchi worse at bases bilaterally.  CARDIOVASCULAR: S1 and S2. Regular rate and rhythm. No murmurs, rubs, or gallops. No edema. Pedal pulses 2+ bilaterally.  GASTROINTESTINAL: Soft, nontender, nondistended. No masses. Positive bowel sounds. No hepatosplenomegaly.  MUSCULOSKELETAL: No swelling, clubbing, or edema. Range of motion full in all extremities.  NEUROLOGIC: Cranial nerves II through XII are intact. No gross focal neurological deficits. Sensation intact. Reflexes intact.  SKIN: No ulceration, lesions, rashes, or cyanosis. Skin warm and dry. Turgor intact.  PSYCHIATRIC: Mood, affect within normal limits. The patient is awake, alert and oriented x 3. Insight, judgment intact.       IMAGING     ASSESSMENT/PLAN   Left lower lobe cosolidated mass     -  patient s/p  chest tube placement while hospitalized and already has ex vacuo pneumothorax on CT chest    - concern for LLL bronchogenic carcinoma   - patient s/p full scope of therapy for PNA   - Clinically improved but still with + constitutional symptoms   - Today we plan to have invasive specimens for diagnosis   - lung biopsy via robotic bronchoscopy    - therapeutic aspiration of mucus plugging of tracheobronchial tree bilaterally   - bronchoalveolar lavage via flexible bronchoscopy    - cytobrushings and microbrushings   - KOH, AFB, gram stain and culture as well as cytology    - Endobronchial [place] for lymph node staging  -     -Reviewed risks/complications and benefits with patient, risks include infection, pneumothorax/pneumomediastinum which may require chest tube placement as well as overnight/prolonged hospitalization and possible mechanical  ventilation. Other risks include bleeding and very rarely death.  Patient understands risks and wishes to proceed.  Additional questions were answered, and patient is aware that post procedure patient will be going home with family and may experience cough with possible clots on expectoration as well as phlegm which may last few days as well as hoarseness of voice post intubation and mechanical ventilation.             Thank you for allowing me to participate in the care of this patient.   Patient/Family are satisfied with care plan and all questions have been answered.    Provider disclosure: Patient with at least one acute or chronic illness or injury that poses a threat to life or bodily function and is being managed actively during this encounter.  All of the below services have been performed independently by signing provider:  review of prior documentation from internal and or external health records.  Review of previous and current lab results.  Interview and comprehensive assessment during patient visit today. Review of current and previous chest radiographs/CT scans. Discussion of management and test interpretation with health care team and patient/family.   This document was prepared using Dragon voice recognition software and may include unintentional dictation errors.     Vida Rigger, M.D.  Division of Pulmonary & Critical Care Medicine

## 2023-04-27 NOTE — Procedures (Signed)
ROBOTIC  NAVIGATIONAL BRONCHOSCOPY PROCEDURE NOTE  FIBEROPTIC BRONCHOSCOPY WITH THERAPEUTIC ASPIRATION OF TRACHEOBRONCHIAL TREE AND BRONCHOALVEOLAR LAVAGE PROCEDURE NOTE  ENDOBRONCHIAL ULTRASOUND PROCEDURE NOTE >3 LYMPH NODES    Flexible bronchoscopy was performed  by : Karna Christmas MD  assistance by : 1)Repiratory therapist  and 2)[employer] staff and 3) Anesthesia team and 4) Flouroscopy team and 5) [facility]  supporting staff   Indication for the procedure was :  Pre-procedural H&P. The following assessment was performed on the day of the procedure prior to initiating sedation History:  Chest pain n Dyspnea y Hemoptysis n Cough y Fever n Other pertinent items n  Examination Vital signs -reviewed as per nursing documentation today Cardiac    Murmurs: n  Rubs : n  Gallop: n Lungs Wheezing: n Rales : n Rhonchi :y  Other pertinent findings: SOB/hypoxemia due to chronic lung disease   Pre-procedural assessment for Procedural Sedation included: Depth of sedation: As per anesthesia team  ASA Classification:  2 Mallampati airway assessment: 3    Medication list reviewed: y  The patient's interval history was taken and revealed: no new complaints The pre- procedure physical examination revealed: No new findings Refer to prior clinic note for details.  Informed Consent: Informed consent was obtained from:  patient after explanation of procedure and risks, benefits, as well as alternative procedures available.  Explanation of level of sedation and possible transfusion was also provided.    Procedural Preparation: Time out was performed and patient was identified by name and birthdate and procedure to be performed and side for sampling, if any, was specified. Pt was intubated by anesthesia.  The patient was appropriately draped.   Fiberoptic bronchoscopy with airway inspection and BAL Procedure findings:  Bronchoscope was inserted via ETT  without difficulty.  Posterior  oropharynx, epiglottis, arytenoids, false cords and vocal cords were not visualized as these were bypassed by endotracheal tube. The distal trachea was normal in circumference and appearance without mucosal, cartilaginous or branching abnormalities.  The main carina was mildly splayed . All right and left lobar airways were visualized to the Subsegmental level.  Sub- sub segmental carinae were identified in all the distal airways.   Secretions were visible in the following airways and appeared to be clear.  The mucosa was : friable at LEFT LOWER LOBE  Airways were notable for:        exophytic lesions :n       extrinsic compression in the following distributions: n.       Friable mucosa: y       Teacher, music /pigmentation: n     Post procedure Diagnosis:   MUCUS PLUGGING OF TRACEHOBRONCHIAL TREE  THERAPEUTIC ASPIRATION PERFORMED MULTIPLE TIMES AT LEFT LOWER LOBE  BAL WAS PERFORMED AT LLL FOR MICROBIOLOGY  SURGICAL PATHOLOGY ENDOBRONCHIAL FORCEPS LUNG BIOPSY FOR MICROBIOLOGY  X 1     Robotic Navigational Bronchoscopy Procedure Findings:   .  Post appropriate planning and registration peripheral navigation was used to visualize target lesion.    LEFT LOWER LOBE LESION      CYTOBRUSH X 2 - FOR CYTOLOGY       SURGICAL LUNG BIOPYS X 5 ENDOBRONCHIAL BIOPSY  BAL OF LLL FOR CYTOLOGY X 1   Post procedure diagnosis: infection vs neoplasm      Endobronchial ultrasound assisted hilar and mediastinal lymph node biopsies procedure findings: The fiberoptic bronchoscope was removed and the EBUS scope was introduced. Examination began to evaluate for pathologically enlarged lymph nodes starting on the right  side progressing to the left side.  All lymph node biopsies performed with 21g  needle. Lymph node biopsies were sent in cytolite for all stations.  Station 10R - 1.4cm - biopsied 3 times Station 7 - 1.8cm biopsied 5 times Station 10L 1.5cm - biopsied 3 times   Post procedure  diagnosis:  lymphadenopathy noted consisted with metastasis   Specimens obtained included:  Estimated Blood loss: expected <5 cc.  Complications included:  None immediate   Preliminary CXR findings :  In process  Note patient had pneumothorax of left lung prior to procedure as evidenced by CT chest performed before procedure.  Disposition: home   Follow up with Dr. Karna Christmas in 5 days for result discussion.     Vida Rigger MD  [facility] Division of Pulmonary & Critical Care Medicine

## 2023-04-27 NOTE — Anesthesia Procedure Notes (Signed)
Procedure Name: Intubation Date/Time: 04/27/2023 9:05 AM  Performed by: Stormy Fabian, CRNAPre-anesthesia Checklist: Patient identified, Patient being monitored, Timeout performed, Emergency Drugs available and Suction available Patient Re-evaluated:Patient Re-evaluated prior to induction Oxygen Delivery Method: Circle system utilized Preoxygenation: Pre-oxygenation with 100% oxygen Induction Type: IV induction Ventilation: Mask ventilation without difficulty Laryngoscope Size: Mac, McGraph and 4 Grade View: Grade I Tube type: Oral Tube size: 8.5 mm Number of attempts: 1 Airway Equipment and Method: Stylet Placement Confirmation: ETT inserted through vocal cords under direct vision, positive ETCO2 and breath sounds checked- equal and bilateral Secured at: 22 cm Tube secured with: Tape Dental Injury: Teeth and Oropharynx as per pre-operative assessment

## 2023-04-27 NOTE — Anesthesia Preprocedure Evaluation (Signed)
Anesthesia Evaluation  Patient identified by MRN, date of birth, ID band Patient awake    Reviewed: Allergy & Precautions, NPO status , Patient's Chart, lab work & pertinent test results  Airway Mallampati: III  TM Distance: >3 FB Neck ROM: Full    Dental  (+) Missing, Poor Dentition, Dental Advisory Given   Pulmonary neg pulmonary ROS, COPD,  COPD inhaler, Current SmokerPatient did not abstain from smoking.   Pulmonary exam normal  + decreased breath sounds      Cardiovascular Exercise Tolerance: Good hypertension, Pt. on medications negative cardio ROS Normal cardiovascular exam Rhythm:Regular Rate:Normal     Neuro/Psych Seizures -, Well Controlled,   Anxiety Depression    negative neurological ROS  negative psych ROS   GI/Hepatic negative GI ROS, Neg liver ROS,GERD  Medicated,,  Endo/Other  negative endocrine ROS    Renal/GU negative Renal ROS     Musculoskeletal   Abdominal Normal abdominal exam  (+)   Peds negative pediatric ROS (+)  Hematology negative hematology ROS (+) Blood dyscrasia, anemia   Anesthesia Other Findings Past Medical History: No date: Allergy No date: Arthritis No date: Depression 10/29/2017: Elevated liver enzymes No date: GERD (gastroesophageal reflux disease) 12/31/2017: Hearing loss, conductive, bilateral No date: Hypertension No date: Polio No date: Seizures (HCC)     Comment:  last seizure about 7 years ago.  No date: Substance abuse (HCC)     Comment:  Alcohol abuse  Past Surgical History: 06/15/2022: COLONOSCOPY; N/A     Comment:  Procedure: COLONOSCOPY;  Surgeon: Wyline Mood, MD;                Location: [facility] ENDOSCOPY;  Service: Gastroenterology;                Laterality: N/A; 07/13/2021: COLONOSCOPY WITH PROPOFOL; N/A     Comment:  Procedure: COLONOSCOPY WITH PROPOFOL;  Surgeon: Wyline Mood, MD;  Location: [facility] ENDOSCOPY;  Service:                Gastroenterology;  Laterality: N/A; 07/13/2021: ESOPHAGOGASTRODUODENOSCOPY; N/A     Comment:  Procedure: ESOPHAGOGASTRODUODENOSCOPY (EGD);  Surgeon:               Wyline Mood, MD;  Location: [facility] ENDOSCOPY;  Service:               Gastroenterology;  Laterality: N/A; 10/13/2021: ESOPHAGOGASTRODUODENOSCOPY (EGD) WITH PROPOFOL; N/A     Comment:  Procedure: ESOPHAGOGASTRODUODENOSCOPY (EGD) WITH               PROPOFOL;  Surgeon: Wyline Mood, MD;  Location: [facility]               ENDOSCOPY;  Service: Gastroenterology;  Laterality: N/A; 11/08/2021: ESOPHAGOGASTRODUODENOSCOPY (EGD) WITH PROPOFOL; N/A     Comment:  Procedure: ESOPHAGOGASTRODUODENOSCOPY (EGD) WITH               PROPOFOL;  Surgeon: Wyline Mood, MD;  Location: [facility]               ENDOSCOPY;  Service: Gastroenterology;  Laterality: N/A; 06/15/2022: ESOPHAGOGASTRODUODENOSCOPY (EGD) WITH PROPOFOL; N/A     Comment:  Procedure: ESOPHAGOGASTRODUODENOSCOPY (EGD) WITH               PROPOFOL;  Surgeon: Wyline Mood, MD;  Location: [facility]               ENDOSCOPY;  Service: Gastroenterology;  Laterality: N/A;  03/13/2023: IR RADIOLOGIST EVAL & MGMT 1988: LEG SURGERY     Comment:  8 surgeries-hips and legs for walking after getting               polio- last one 1988 No date: WRIST SURGERY     Comment:  right     Reproductive/Obstetrics negative OB ROS                             Anesthesia Physical Anesthesia Plan  ASA: 3  Anesthesia Plan: General   Post-op Pain Management:    Induction: Intravenous  PONV Risk Score and Plan: Ondansetron, Dexamethasone, Midazolam and Treatment may vary due to age or medical condition  Airway Management Planned: Oral ETT  Additional Equipment:   Intra-op Plan:   Post-operative Plan: Extubation in OR  Informed Consent: I have reviewed the patients History and Physical, chart, labs and discussed the procedure including the risks, benefits and alternatives for the proposed anesthesia  with the patient or authorized representative who has indicated his/her understanding and acceptance.     Dental Advisory Given  Plan Discussed with: CRNA and Surgeon  Anesthesia Plan Comments:        Anesthesia Quick Evaluation

## 2023-04-27 NOTE — Transfer of Care (Signed)
Immediate Anesthesia Transfer of Care Note  Patient: Keith Parker  Procedure(s) Performed: VIDEO BRONCHOSCOPY WITH ENDOBRONCHIAL ULTRASOUND ROBOTIC ASSISTED NAVIGATIONAL BRONCHOSCOPY  Patient Location: PACU  Anesthesia Type:General  Level of Consciousness: awake  Airway & Oxygen Therapy: Patient Spontanous Breathing and Patient connected to face mask oxygen  Post-op Assessment: Report given to RN and Post -op Vital signs reviewed and stable  Post vital signs: Reviewed and stable  Last Vitals:  Vitals Value Taken Time  BP 106/82 04/27/23 1045  Temp    Pulse 96 04/27/23 1046  Resp 21 04/27/23 1046  SpO2 96 % 04/27/23 1046  Vitals shown include unfiled device data.  Last Pain:  Vitals:   04/27/23 0749  PainSc: 0-No pain         Complications: No notable events documented.

## 2023-04-30 LAB — CULTURE, RESPIRATORY W GRAM STAIN
Culture: NO GROWTH
Gram Stain: NONE SEEN

## 2023-04-30 LAB — CULTURE, BAL-QUANTITATIVE W GRAM STAIN: Culture: NO GROWTH

## 2023-04-30 LAB — CYTOLOGY - NON PAP

## 2023-04-30 LAB — SURGICAL PATHOLOGY

## 2023-05-03 ENCOUNTER — Ambulatory Visit: Payer: Medicare Other | Attending: Gastroenterology

## 2023-05-28 ENCOUNTER — Encounter: Payer: Self-pay | Admitting: Gastroenterology

## 2023-06-04 ENCOUNTER — Ambulatory Visit
Admission: RE | Admit: 2023-06-04 | Discharge: 2023-06-04 | Disposition: A | Discharge status: Home / Self Care | Payer: Medicare Other | Attending: Gastroenterology | Admitting: Gastroenterology

## 2023-06-04 ENCOUNTER — Ambulatory Visit: Payer: Medicare Other | Admitting: Certified Registered"

## 2023-06-04 ENCOUNTER — Encounter
Admission: RE | Disposition: A | Discharge status: Home / Self Care | Payer: Self-pay | Source: Home / Self Care | Attending: Gastroenterology

## 2023-06-04 ENCOUNTER — Other Ambulatory Visit: Payer: Self-pay

## 2023-06-04 ENCOUNTER — Encounter: Payer: Self-pay | Admitting: Gastroenterology

## 2023-06-04 DIAGNOSIS — K219 Gastro-esophageal reflux disease without esophagitis: Secondary | ICD-10-CM | POA: Insufficient documentation

## 2023-06-04 DIAGNOSIS — I10 Essential (primary) hypertension: Secondary | ICD-10-CM | POA: Diagnosis not present

## 2023-06-04 DIAGNOSIS — Z09 Encounter for follow-up examination after completed treatment for conditions other than malignant neoplasm: Secondary | ICD-10-CM | POA: Insufficient documentation

## 2023-06-04 DIAGNOSIS — F17210 Nicotine dependence, cigarettes, uncomplicated: Secondary | ICD-10-CM | POA: Insufficient documentation

## 2023-06-04 DIAGNOSIS — K3189 Other diseases of stomach and duodenum: Secondary | ICD-10-CM | POA: Insufficient documentation

## 2023-06-04 DIAGNOSIS — I8500 Esophageal varices without bleeding: Secondary | ICD-10-CM | POA: Diagnosis not present

## 2023-06-04 DIAGNOSIS — K766 Portal hypertension: Secondary | ICD-10-CM | POA: Diagnosis not present

## 2023-06-04 HISTORY — PX: ESOPHAGOGASTRODUODENOSCOPY (EGD) WITH PROPOFOL: SHX5813

## 2023-06-04 SURGERY — ESOPHAGOGASTRODUODENOSCOPY (EGD) WITH PROPOFOL
Anesthesia: General

## 2023-06-04 MED ORDER — LIDOCAINE HCL (PF) 2 % IJ SOLN
INTRAMUSCULAR | Status: DC | PRN
  Administered 2023-06-04 (×2): 100 mg via INTRADERMAL

## 2023-06-04 MED ORDER — SODIUM CHLORIDE 0.9 % IV SOLN
INTRAVENOUS | Status: DC | PRN
  Administered 2023-06-04: 1000 mL via INTRAVENOUS

## 2023-06-04 MED ORDER — PROPOFOL 500 MG/50ML IV EMUL
INTRAVENOUS | Status: DC | PRN
  Administered 2023-06-04: 100 mg via INTRAVENOUS
  Administered 2023-06-04: 150 ug/kg/min via INTRAVENOUS

## 2023-06-04 NOTE — Transfer of Care (Signed)
Immediate Anesthesia Transfer of Care Note  Patient: Keith Parker  Procedure(s) Performed: ESOPHAGOGASTRODUODENOSCOPY (EGD) WITH PROPOFOL  Patient Location: PACU  Anesthesia Type:General  Level of Consciousness: drowsy  Airway & Oxygen Therapy: Patient Spontanous Breathing and Patient connected to face mask oxygen  Post-op Assessment: Report given to RN and Post -op Vital signs reviewed and stable  Post vital signs: stable  Last Vitals:  Vitals Value Taken Time  BP 112/81 06/04/23 1146  Temp    Pulse 86 06/04/23 1147  Resp    SpO2 98 % 06/04/23 1147  Vitals shown include unfiled device data.  Last Pain:  Vitals:   06/04/23 1124  TempSrc: Temporal  PainSc: 0-No pain         Complications: No notable events documented.

## 2023-06-04 NOTE — Anesthesia Preprocedure Evaluation (Addendum)
Anesthesia Evaluation  Patient identified by MRN, date of birth, ID band Patient awake    Reviewed: Allergy & Precautions, NPO status , Patient's Chart, lab work & pertinent test results  History of Anesthesia Complications Negative for: history of anesthetic complications  Airway Mallampati: III  TM Distance: >3 FB Neck ROM: full    Dental  (+) Poor Dentition, Missing   Pulmonary COPD, Current Smoker   Pulmonary exam normal        Cardiovascular hypertension, Pt. on medications Normal cardiovascular exam     Neuro/Psych Seizures -,  PSYCHIATRIC DISORDERS Anxiety Depression       GI/Hepatic ,GERD  ,,(+)     substance abuse    Endo/Other  negative endocrine ROS    Renal/GU negative Renal ROS  negative genitourinary   Musculoskeletal  (+) Arthritis ,    Abdominal   Peds  Hematology negative hematology ROS (+)   Anesthesia Other Findings Past Medical History: No date: Allergy No date: Arthritis No date: Depression 10/29/2017: Elevated liver enzymes No date: GERD (gastroesophageal reflux disease) 12/31/2017: Hearing loss, conductive, bilateral No date: Hypertension No date: Polio No date: Seizures (HCC)     Comment:  last seizure about 7 years ago.  No date: Substance abuse (HCC)     Comment:  Alcohol abuse  Past Surgical History: 06/15/2022: COLONOSCOPY; N/A     Comment:  Procedure: COLONOSCOPY;  Surgeon: Wyline Mood, MD;                Location: [facility] ENDOSCOPY;  Service: Gastroenterology;                Laterality: N/A; 07/13/2021: COLONOSCOPY WITH PROPOFOL; N/A     Comment:  Procedure: COLONOSCOPY WITH PROPOFOL;  Surgeon: Wyline Mood, MD;  Location: [facility] ENDOSCOPY;  Service:               Gastroenterology;  Laterality: N/A; 07/13/2021: ESOPHAGOGASTRODUODENOSCOPY; N/A     Comment:  Procedure: ESOPHAGOGASTRODUODENOSCOPY (EGD);  Surgeon:               Wyline Mood, MD;  Location: [facility]  ENDOSCOPY;  Service:               Gastroenterology;  Laterality: N/A; 10/13/2021: ESOPHAGOGASTRODUODENOSCOPY (EGD) WITH PROPOFOL; N/A     Comment:  Procedure: ESOPHAGOGASTRODUODENOSCOPY (EGD) WITH               PROPOFOL;  Surgeon: Wyline Mood, MD;  Location: [facility]               ENDOSCOPY;  Service: Gastroenterology;  Laterality: N/A; 11/08/2021: ESOPHAGOGASTRODUODENOSCOPY (EGD) WITH PROPOFOL; N/A     Comment:  Procedure: ESOPHAGOGASTRODUODENOSCOPY (EGD) WITH               PROPOFOL;  Surgeon: Wyline Mood, MD;  Location: [facility]               ENDOSCOPY;  Service: Gastroenterology;  Laterality: N/A; 06/15/2022: ESOPHAGOGASTRODUODENOSCOPY (EGD) WITH PROPOFOL; N/A     Comment:  Procedure: ESOPHAGOGASTRODUODENOSCOPY (EGD) WITH               PROPOFOL;  Surgeon: Wyline Mood, MD;  Location: [facility]               ENDOSCOPY;  Service: Gastroenterology;  Laterality: N/A; 03/13/2023: IR RADIOLOGIST EVAL & MGMT 1988: LEG SURGERY     Comment:  8 surgeries-hips and legs for walking after getting  polio- last one 1988 04/27/2023: VIDEO BRONCHOSCOPY WITH ENDOBRONCHIAL ULTRASOUND; N/A     Comment:  Procedure: VIDEO BRONCHOSCOPY WITH ENDOBRONCHIAL               ULTRASOUND;  Surgeon: Vida Rigger, MD;  Location:               [facility] ORS;  Service: Thoracic;  Laterality: N/A; No date: WRIST SURGERY     Comment:  right  BMI    Body Mass Index: 22.22 kg/m      Reproductive/Obstetrics negative OB ROS                             Anesthesia Physical Anesthesia Plan  ASA: 3  Anesthesia Plan: General   Post-op Pain Management: Minimal or no pain anticipated   Induction: Intravenous  PONV Risk Score and Plan: 1 and Propofol infusion and TIVA  Airway Management Planned: Natural Airway and Nasal Cannula  Additional Equipment:   Intra-op Plan:   Post-operative Plan:   Informed Consent: I have reviewed the patients History and Physical, chart, labs and discussed the  procedure including the risks, benefits and alternatives for the proposed anesthesia with the patient or authorized representative who has indicated his/her understanding and acceptance.     Dental Advisory Given  Plan Discussed with: Anesthesiologist, CRNA and Surgeon  Anesthesia Plan Comments: (Patient consented for risks of anesthesia including but not limited to:  - adverse reactions to medications - risk of airway placement if required - damage to eyes, teeth, lips or other oral mucosa - nerve damage due to positioning  - sore throat or hoarseness - Damage to heart, brain, nerves, lungs, other parts of body or loss of life  Patient voiced understanding and assent.)       Anesthesia Quick Evaluation

## 2023-06-04 NOTE — Anesthesia Postprocedure Evaluation (Signed)
Anesthesia Post Note  Patient: FREDIS FAYSON  Procedure(s) Performed: ESOPHAGOGASTRODUODENOSCOPY (EGD) WITH PROPOFOL  Patient location during evaluation: Endoscopy Anesthesia Type: General Level of consciousness: awake and alert Pain management: pain level controlled Vital Signs Assessment: post-procedure vital signs reviewed and stable Respiratory status: spontaneous breathing, nonlabored ventilation, respiratory function stable and patient connected to nasal cannula oxygen Cardiovascular status: blood pressure returned to baseline and stable Postop Assessment: no apparent nausea or vomiting Anesthetic complications: no   There were no known notable events for this encounter.   Last Vitals:  Vitals:   06/04/23 1200 06/04/23 1208  BP: 138/87 138/87  Pulse:  78  Resp: 16 16  Temp:    SpO2:  100%    Last Pain:  Vitals:   06/04/23 1208  TempSrc:   PainSc: 0-No pain                 Louie Boston

## 2023-06-04 NOTE — Op Note (Signed)
[facility] Gastroenterology Patient Name: Keith Parker Procedure Date: 06/04/2023 11:06 AM MRN: [id number] Account #: [id number] Date of Birth: 1976/01/14 Admit Type: Outpatient Age: 47 Room: [facility] ENDO ROOM 1 Gender: Male Note Status: Finalized Instrument Name: Upper Endoscope [id number] Procedure:             Upper GI endoscopy Indications:           Follow-up of esophageal varices Providers:             Wyline Mood MD, MD Referring MD:          Leanna Sato, MD (Referring MD) Medicines:             Monitored Anesthesia Care Complications:         No immediate complications. Procedure:             Pre-Anesthesia Assessment:                        - Prior to the procedure, a History and Physical was                         performed, and patient medications, allergies and                         sensitivities were reviewed. The patient's tolerance                         of previous anesthesia was reviewed.                        - The risks and benefits of the procedure and the                         sedation options and risks were discussed with the                         patient. All questions were answered and informed                         consent was obtained.                        - ASA Grade Assessment: II - A patient with mild                         systemic disease.                        After obtaining informed consent, the endoscope was                         passed under direct vision. Throughout the procedure,                         the patient's blood pressure, pulse, and oxygen                         saturations were monitored continuously. The Endoscope  was introduced through the mouth, and advanced to the                         third part of duodenum. The upper GI endoscopy was                         accomplished with ease. The patient tolerated the                         procedure well. Findings:      The  examined duodenum was normal.      Mild portal hypertensive gastropathy was found in the gastric fundus.      The cardia and gastric fundus were normal on retroflexion.      The examined esophagus was normal. Impression:            - Normal examined duodenum.                        - Portal hypertensive gastropathy.                        - Normal esophagus.                        - No specimens collected. Recommendation:        - Discharge patient to home (with escort).                        - Resume previous diet.                        - Continue present medications.                        - Repeat upper endoscopy in 2 years for surveillance. Procedure Code(s):     --- Professional ---                        [phone], Esophagogastroduodenoscopy, flexible,                         transoral; diagnostic, including collection of                         specimen(s) by brushing or washing, when performed                         (separate procedure) Diagnosis Code(s):     --- Professional ---                        K76.6, Portal hypertension                        K31.89, Other diseases of stomach and duodenum                        I85.00, Esophageal varices without bleeding CPT copyright 2022 American Medical Association. All rights reserved. The codes documented in this report are preliminary and upon coder review may  be revised to meet current compliance requirements. Wyline Mood, MD Wyline Mood MD, MD 06/04/2023 11:45:02  AM This report has been signed electronically. Number of Addenda: 0 Note Initiated On: 06/04/2023 11:06 AM Estimated Blood Loss:  Estimated blood loss: none.      [facility]

## 2023-06-04 NOTE — H&P (Signed)
Wyline Mood, MD [address], [address], [place], [place], [address] [address], [place], [place], [address] Phone: [phone]  Fax: [phone]  Primary Care Physician:  Leanna Sato, MD   Pre-Procedure History & Physical: HPI:  Keith Parker is a 47 y.o. male is here for an endoscopy    Past Medical History:  Diagnosis Date   Allergy    Arthritis    Depression    Elevated liver enzymes 10/29/2017   GERD (gastroesophageal reflux disease)    Hearing loss, conductive, bilateral 12/31/2017   Hypertension    Polio    Seizures (HCC)    last seizure about 7 years ago.    Substance abuse (HCC)    Alcohol abuse    Past Surgical History:  Procedure Laterality Date   COLONOSCOPY N/A 06/15/2022   Procedure: COLONOSCOPY;  Surgeon: Wyline Mood, MD;  Location: [facility] ENDOSCOPY;  Service: Gastroenterology;  Laterality: N/A;   COLONOSCOPY WITH PROPOFOL N/A 07/13/2021   Procedure: COLONOSCOPY WITH PROPOFOL;  Surgeon: Wyline Mood, MD;  Location: [facility] ENDOSCOPY;  Service: Gastroenterology;  Laterality: N/A;   ESOPHAGOGASTRODUODENOSCOPY N/A 07/13/2021   Procedure: ESOPHAGOGASTRODUODENOSCOPY (EGD);  Surgeon: Wyline Mood, MD;  Location: [facility] ENDOSCOPY;  Service: Gastroenterology;  Laterality: N/A;   ESOPHAGOGASTRODUODENOSCOPY (EGD) WITH PROPOFOL N/A 10/13/2021   Procedure: ESOPHAGOGASTRODUODENOSCOPY (EGD) WITH PROPOFOL;  Surgeon: Wyline Mood, MD;  Location: [facility] ENDOSCOPY;  Service: Gastroenterology;  Laterality: N/A;   ESOPHAGOGASTRODUODENOSCOPY (EGD) WITH PROPOFOL N/A 11/08/2021   Procedure: ESOPHAGOGASTRODUODENOSCOPY (EGD) WITH PROPOFOL;  Surgeon: Wyline Mood, MD;  Location: [facility] ENDOSCOPY;  Service: Gastroenterology;  Laterality: N/A;   ESOPHAGOGASTRODUODENOSCOPY (EGD) WITH PROPOFOL N/A 06/15/2022   Procedure: ESOPHAGOGASTRODUODENOSCOPY (EGD) WITH PROPOFOL;  Surgeon: Wyline Mood, MD;  Location: [facility] ENDOSCOPY;  Service: Gastroenterology;  Laterality: N/A;   IR RADIOLOGIST EVAL &  MGMT  03/13/2023   LEG SURGERY  1988   8 surgeries-hips and legs for walking after getting polio- last one 1988   VIDEO BRONCHOSCOPY WITH ENDOBRONCHIAL ULTRASOUND N/A 04/27/2023   Procedure: VIDEO BRONCHOSCOPY WITH ENDOBRONCHIAL ULTRASOUND;  Surgeon: Vida Rigger, MD;  Location: [facility] ORS;  Service: Thoracic;  Laterality: N/A;   WRIST SURGERY     right    Prior to Admission medications   Medication Sig Start Date End Date Taking? Authorizing Provider  ferrous sulfate 325 (65 FE) MG tablet Take 1 tablet (325 mg total) by mouth daily with breakfast. 03/14/23   Sunnie Nielsen, DO  folic acid (FOLVITE) 1 MG tablet Take 1 tablet (1 mg total) by mouth daily. 03/15/23   Sunnie Nielsen, DO  furosemide (LASIX) 20 MG tablet Take 0.5 tablets (10 mg total) by mouth daily. 03/14/23   Sunnie Nielsen, DO  gabapentin (NEURONTIN) 300 MG capsule Take 300 mg by mouth 3 (three) times daily. 10/26/21   [provider]  guaiFENesin (MUCINEX) 600 MG 12 hr tablet Take 2 tablets (1,200 mg total) by mouth 2 (two) times daily as needed for to loosen phlegm or cough. 03/14/23   Sunnie Nielsen, DO  hydrOXYzine (ATARAX) 25 MG tablet Take 1 tablet (25 mg total) by mouth every 8 (eight) hours as needed for anxiety. 01/01/22   Phineas Semen, MD  mirtazapine (REMERON) 15 MG tablet Take 15 mg by mouth at bedtime. 02/05/23   [provider]  Multiple Vitamin (MULTIVITAMIN WITH MINERALS) TABS tablet Take 1 tablet by mouth daily. 03/15/23   Sunnie Nielsen, DO  nicotine (NICODERM CQ - DOSED IN MG/24 HOURS) 21 mg/24hr patch Place  1 patch (21 mg total) onto the skin daily. 03/15/23   Sunnie Nielsen, DO  oxyCODONE (OXY IR/ROXICODONE) 5 MG immediate release tablet Take 1 tablet (5 mg total) by mouth every 6 (six) hours as needed for severe pain. 03/14/23   Sunnie Nielsen, DO  polyethylene glycol (MIRALAX / GLYCOLAX) 17 g packet Take 34 g by mouth daily as needed for mild constipation or moderate  constipation. 03/14/23   Sunnie Nielsen, DO  spironolactone (ALDACTONE) 25 MG tablet Take 4 tablets (100 mg total) by mouth daily. 03/14/23   Sunnie Nielsen, DO  thiamine (VITAMIN B-1) 100 MG tablet Take 1 tablet (100 mg total) by mouth daily. 03/15/23   Sunnie Nielsen, DO  traZODone (DESYREL) 100 MG tablet Take 200 mg by mouth at bedtime as needed. 05/09/22   [provider]  XIFAXAN 550 MG TABS tablet Take 550 mg by mouth 2 (two) times daily. 05/09/22   [provider]    Allergies as of 04/26/2023   (No Known Allergies)    Family History  Problem Relation Age of Onset   Cancer Mother        Breast   Arthritis Mother    Alcohol abuse Father    Arthritis Father    Hypertension Father    Cancer Maternal Grandmother        Breast   Hypertension Maternal Grandmother    Hypertension Maternal Grandfather    Hypertension Paternal Grandmother    Hypertension Paternal Grandfather    Drug abuse Maternal Uncle     Social History   Socioeconomic History   Marital status: Single    Spouse name: Not on file   Number of children: 2   Years of education: 12   Highest education level: Not on file  Occupational History   Occupation: Disabled    Comment: Post polio syndrome / Depression  Tobacco Use   Smoking status: Every Day    Current packs/day: 2.00    Average packs/day: 2.0 packs/day for 25.0 years (50.0 ttl pk-yrs)    Types: Cigarettes   Smokeless tobacco: Never  Vaping Use   Vaping status: Never Used  Substance and Sexual Activity   Alcohol use: Yes    Alcohol/week: 70.0 standard drinks of alcohol    Types: 70 Cans of beer per week    Comment: 2 days ago   Drug use: No   Sexual activity: Not on file  Other Topics Concern   Not on file  Social History Narrative   Fun: Workout   Denies religious beliefs effecting health care.    Social Determinants of Health   Financial Resource Strain: Not on file  Food Insecurity: No Food Insecurity  (03/06/2023)   Hunger Vital Sign    Worried About Running Out of Food in the Last Year: Never true    Ran Out of Food in the Last Year: Never true  Transportation Needs: No Transportation Needs (03/06/2023)   PRAPARE - Administrator, Civil Service (Medical): No    Lack of Transportation (Non-Medical): No  Physical Activity: Not on file  Stress: Not on file  Social Connections: Not on file  Intimate Partner Violence: Not At Risk (03/06/2023)   Humiliation, Afraid, Rape, and Kick questionnaire    Fear of Current or Ex-Partner: No    Emotionally Abused: No    Physically Abused: No    Sexually Abused: No    Review of Systems: See HPI, otherwise negative ROS  Physical Exam: There were  no vitals taken for this visit. General:   Alert,  pleasant and cooperative in NAD Head:  Normocephalic and atraumatic. Neck:  Supple; no masses or thyromegaly. Lungs:  Clear throughout to auscultation, normal respiratory effort.    Heart:  +S1, +S2, Regular rate and rhythm, No edema. Abdomen:  Soft, nontender and nondistended. Normal bowel sounds, without guarding, and without rebound.   Neurologic:  Alert and  oriented x4;  grossly normal neurologically.  Impression/Plan: Keith Parker is here for an endoscopy  to be performed for  evaluation of esophageal varices.     Risks, benefits, limitations, and alternatives regarding endoscopy have been reviewed with the patient.  Questions have been answered.  All parties agreeable.   Wyline Mood, MD  06/04/2023, 11:09 AM

## 2023-06-05 ENCOUNTER — Encounter: Payer: Self-pay | Admitting: Gastroenterology

## 2023-08-07 ENCOUNTER — Ambulatory Visit: Payer: Self-pay | Admitting: General Surgery

## 2023-08-07 NOTE — H&P (Signed)
History of Present Illness Keith Parker is a 48 year old male who presents with an inguinal hernia.  He has had a right-sided inguinal hernia for the past three to four years, characterized by a 'burning pressure' that is noticeable but not painful. A bulge is visible daily on the right side and can be manually reduced. There is no bulge on the left side. The burning sensation is slight and intermittent.  He has a cough that varies in severity and can exacerbate the hernia, making the bulge more pronounced. Currently, he feels well and notes that his cough is at its best state.  He is a smoker and has attempted to quit multiple times but has resumed each time. He acknowledges the difficulty in quitting smoking and its impact on his life.      PAST MEDICAL HISTORY:  Past Medical History:  Diagnosis Date   Alcohol abuse    Alcoholic cirrhosis of liver with ascites (CMS/HHS-HCC)    Anasarca    Cavitary pneumonia, unspecified    Depression/ Anxiety    Elevated liver enzymes    Esophageal varices in cirrhosis (CMS/HHS-HCC)    GERD (gastroesophageal reflux disease)    Hearing loss of both ears    Hydropneumothorax    Hypertension    Hypochloremia    Hyponatremia    Insomnia    Late effects of poliomyelitis    Lung mass 03/2023   Macrocytic anemia    Polio (HHS-HCC) 1988   Seasonal allergies         PAST SURGICAL HISTORY:   Past Surgical History:  Procedure Laterality Date   ESOPHAGOGASTRODOUDENOSCOPY  07/13/2021   COLONOSCOPY  07/13/2021   ESOPHAGOGASTRODOUDENOSCOPY  10/13/2021   ESOPHAGOGASTRODOUDENOSCOPY  11/08/2021   ESOPHAGOGASTRODOUDENOSCOPY  06/15/2022   COLONOSCOPY  06/15/2022   leg surgery     8 surgeries in total on hips and legs to help with walking - after polio. Last surgery in 1988.   wrist surgery           MEDICATIONS:  Outpatient Encounter Medications as of 08/07/2023  Medication Sig Dispense Refill   ferrous sulfate 325 (65 FE) MG tablet Take 325 mg  by mouth daily with breakfast     folic acid (FOLVITE) 1 MG tablet Take 1,000 mcg by mouth once daily     FUROsemide (LASIX) 20 MG tablet Take 20 mg by mouth once daily     gabapentin (NEURONTIN) 300 MG capsule Take 300 mg by mouth 3 (three) times daily     guaiFENesin (MUCINEX) 600 mg SR tablet Take 2 tablets (1,200 mg total) by mouth every 12 (twelve) hours as needed for Cough or Congestion 30 tablet 0   hydrOXYzine (ATARAX) 25 MG tablet Take 25 mg by mouth every 8 (eight) hours as needed for Anxiety     ipratropium-albuteroL (COMBIVENT RESPIMAT) 20-100 mcg/actuation inhaler Inhale 1 Puff into the lungs 4 (four) times daily as needed for Wheezing 4 g 11   mirtazapine (REMERON) 15 MG tablet Take 15 mg by mouth at bedtime     multivitamin tablet Take 1 tablet by mouth once daily     oxyCODONE-acetaminophen (PERCOCET) 5-325 mg tablet every 6 (six) hours as needed     spironolactone (ALDACTONE) 100 MG tablet Take 100 mg by mouth once daily     thiamine (VITAMIN B-1) 100 MG tablet Take 100 mg by mouth once daily     traZODone (DESYREL) 100 MG tablet Take 200 mg by mouth at bedtime as  needed for Sleep     XIFAXAN 550 mg tablet Take 550 mg by mouth 2 (two) times daily     benzonatate (TESSALON) 200 MG capsule Take 1 capsule (200 mg total) by mouth 2 (two) times daily as needed for Cough (Patient not taking: Reported on 08/07/2023) 30 capsule 1   NICOTINE, POLACRILEX, BUCL Take by mouth as needed for Smoking cessation Chew the gum until it tingles and then park it between your cheek and gum. Repeat. (Patient not taking: Reported on 08/07/2023)     oxyCODONE (ROXICODONE) 5 MG immediate release tablet Take 5 mg by mouth every 6 (six) hours as needed for Pain (Patient not taking: Reported on 08/07/2023)     predniSONE (DELTASONE) 20 MG tablet Take 1 tablet (20 mg total) by mouth once daily (Patient not taking: Reported on 08/07/2023) 5 tablet 0   No facility-administered encounter medications on file as of  08/07/2023.     ALLERGIES:   Patient has no known allergies.   SOCIAL HISTORY:  Social History   Socioeconomic History   Marital status: Single  Tobacco Use   Smoking status: Every Day    Current packs/day: 1.00    Types: Cigarettes   Smokeless tobacco: Never  Substance and Sexual Activity   Alcohol use: Yes    Comment: 1-40   Social Drivers of Health   Food Insecurity: No Food Insecurity (03/06/2023)   Received from [facility]   Hunger Vital Sign    Worried About Running Out of Food in the Last Year: Never true    Ran Out of Food in the Last Year: Never true  Transportation Needs: No Transportation Needs (03/06/2023)   Received from [facility] - Transportation    Lack of Transportation (Medical): No    Lack of Transportation (Non-Medical): No    FAMILY HISTORY:  Family History  Adopted: Yes  Problem Relation Name Age of Onset   Breast cancer Mother     Other (Other) Father     Alcohol abuse Father     High blood pressure (Hypertension) Father     Arthritis Father     Breast cancer Maternal Grandmother     High blood pressure (Hypertension) Maternal Grandmother     High blood pressure (Hypertension) Maternal Grandfather     High blood pressure (Hypertension) Paternal Grandmother     High blood pressure (Hypertension) Paternal Grandfather       GENERAL REVIEW OF SYSTEMS:   General ROS: negative for - chills, fatigue, fever, weight gain or weight loss Allergy and Immunology ROS: negative for - hives  Hematological and Lymphatic ROS: negative for - bleeding problems or bruising, negative for palpable nodes Endocrine ROS: negative for - heat or cold intolerance, hair changes Respiratory ROS: Positive for - cough Cardiovascular ROS: no chest pain or palpitations GI ROS: negative for nausea, vomiting, abdominal pain, diarrhea, constipation Musculoskeletal ROS: Positive for - joint swelling or muscle pain Neurological ROS: negative for - confusion,  syncope Dermatological ROS: negative for pruritus and rash  PHYSICAL EXAM:  Vitals:   08/07/23 1023  BP: (!) 142/87  Pulse: 109  .  Ht:152.4 cm (5') Wt:52.6 kg (116 lb) WUJ:WJXB surface area is 1.49 meters squared. Body mass index is 22.65 kg/m.Marland Kitchen   GENERAL: Alert, active, oriented x3  HEENT: Pupils equal reactive to light. Extraocular movements are intact. Sclera clear. Palpebral conjunctiva normal red color.Pharynx clear.  NECK: Supple with no palpable mass and no adenopathy.  LUNGS: Sound  clear with no rales rhonchi or wheezes.  HEART: Regular rhythm S1 and S2 without murmur.  ABDOMEN: Soft and depressible, nontender with no palpable mass, no hepatomegaly.  Reducible right inguinal hernia, weakness in the left groin.  EXTREMITIES: Well-developed well-nourished symmetrical with no dependent edema.  NEUROLOGICAL: Awake alert oriented, facial expression symmetrical, moving all extremities.   Assessment & Plan Inguinal Hernia A chronic right-sided inguinal hernia has been present for 3-4 years, causing an intermittent slight burning sensation and a visible bulge that can be manually reduced. There are no signs of acute incarceration or strangulation, but symptoms worsen with coughing. Laparoscopic hernia repair was discussed, involving three incisions and mesh placement, with same-day surgery and a two-week recovery period. Potential post-surgical bruising and swelling in the scrotum and testicles were explained, expected to resolve within two weeks. Risks of recurrence, especially with continued smoking and coughing, were highlighted, along with the importance of avoiding heavy lifting and gym activities post-surgery. Schedule laparoscopic hernia repair within 1-2 weeks. Perform minimally invasive surgery with three incisions and mesh placement. Evaluate and potentially repair the left side if a smaller hernia is detected during surgery. Prescribe pain medication post-surgery, with  instructions to use Tylenol or ibuprofen as needed. Advise avoiding lifting over 20 pounds and refraining from gym activities for at least two weeks post-surgery. Instruct on the use of skin glue for incision care, allowing normal showering without dressing changes.  Chronic Cough Intermittent chronic cough exacerbates hernia symptoms by increasing intra-abdominal pressure. The impact of coughing on hernia repair and the importance of managing cough pre- and post-surgery were discussed. Coordinate with a pulmonologist to manage and minimize coughing episodes pre- and post-surgery.  Smoking Current smoking increases the risk of hernia recurrence and infection post-surgery. Despite multiple unsuccessful attempts at smoking cessation, the decision to proceed with surgery was made, with an understanding of the risks. Discuss the risks of smoking on surgical outcomes, including higher chances of recurrence and infection. Proceed with surgery despite smoking status, with an understanding of the associated risks.   Non-recurrent unilateral inguinal hernia without obstruction or gangrene [K40.90]          Patient verbalized understanding, all questions were answered, and were agreeable with the plan outlined above.   Carolan Shiver, MD  Electronically signed by Carolan Shiver, MD

## 2023-08-07 NOTE — H&P (View-Only) (Signed)
 History of Present Illness Keith Parker is a 48 year old male who presents with an inguinal hernia.  He has had a right-sided inguinal hernia for the past three to four years, characterized by a 'burning pressure' that is noticeable but not painful. A bulge is visible daily on the right side and can be manually reduced. There is no bulge on the left side. The burning sensation is slight and intermittent.  He has a cough that varies in severity and can exacerbate the hernia, making the bulge more pronounced. Currently, he feels well and notes that his cough is at its best state.  He is a smoker and has attempted to quit multiple times but has resumed each time. He acknowledges the difficulty in quitting smoking and its impact on his life.      PAST MEDICAL HISTORY:  Past Medical History:  Diagnosis Date   Alcohol abuse    Alcoholic cirrhosis of liver with ascites (CMS/HHS-HCC)    Anasarca    Cavitary pneumonia, unspecified    Depression/ Anxiety    Elevated liver enzymes    Esophageal varices in cirrhosis (CMS/HHS-HCC)    GERD (gastroesophageal reflux disease)    Hearing loss of both ears    Hydropneumothorax    Hypertension    Hypochloremia    Hyponatremia    Insomnia    Late effects of poliomyelitis    Lung mass 03/2023   Macrocytic anemia    Polio (HHS-HCC) 1988   Seasonal allergies         PAST SURGICAL HISTORY:   Past Surgical History:  Procedure Laterality Date   ESOPHAGOGASTRODOUDENOSCOPY  07/13/2021   COLONOSCOPY  07/13/2021   ESOPHAGOGASTRODOUDENOSCOPY  10/13/2021   ESOPHAGOGASTRODOUDENOSCOPY  11/08/2021   ESOPHAGOGASTRODOUDENOSCOPY  06/15/2022   COLONOSCOPY  06/15/2022   leg surgery     8 surgeries in total on hips and legs to help with walking - after polio. Last surgery in 1988.   wrist surgery           MEDICATIONS:  Outpatient Encounter Medications as of 08/07/2023  Medication Sig Dispense Refill   ferrous sulfate 325 (65 FE) MG tablet Take 325 mg  by mouth daily with breakfast     folic acid (FOLVITE) 1 MG tablet Take 1,000 mcg by mouth once daily     FUROsemide (LASIX) 20 MG tablet Take 20 mg by mouth once daily     gabapentin (NEURONTIN) 300 MG capsule Take 300 mg by mouth 3 (three) times daily     guaiFENesin (MUCINEX) 600 mg SR tablet Take 2 tablets (1,200 mg total) by mouth every 12 (twelve) hours as needed for Cough or Congestion 30 tablet 0   hydrOXYzine (ATARAX) 25 MG tablet Take 25 mg by mouth every 8 (eight) hours as needed for Anxiety     ipratropium-albuteroL (COMBIVENT RESPIMAT) 20-100 mcg/actuation inhaler Inhale 1 Puff into the lungs 4 (four) times daily as needed for Wheezing 4 g 11   mirtazapine (REMERON) 15 MG tablet Take 15 mg by mouth at bedtime     multivitamin tablet Take 1 tablet by mouth once daily     oxyCODONE-acetaminophen (PERCOCET) 5-325 mg tablet every 6 (six) hours as needed     spironolactone (ALDACTONE) 100 MG tablet Take 100 mg by mouth once daily     thiamine (VITAMIN B-1) 100 MG tablet Take 100 mg by mouth once daily     traZODone (DESYREL) 100 MG tablet Take 200 mg by mouth at bedtime as  needed for Sleep     XIFAXAN 550 mg tablet Take 550 mg by mouth 2 (two) times daily     benzonatate (TESSALON) 200 MG capsule Take 1 capsule (200 mg total) by mouth 2 (two) times daily as needed for Cough (Patient not taking: Reported on 08/07/2023) 30 capsule 1   NICOTINE, POLACRILEX, BUCL Take by mouth as needed for Smoking cessation Chew the gum until it tingles and then park it between your cheek and gum. Repeat. (Patient not taking: Reported on 08/07/2023)     oxyCODONE (ROXICODONE) 5 MG immediate release tablet Take 5 mg by mouth every 6 (six) hours as needed for Pain (Patient not taking: Reported on 08/07/2023)     predniSONE (DELTASONE) 20 MG tablet Take 1 tablet (20 mg total) by mouth once daily (Patient not taking: Reported on 08/07/2023) 5 tablet 0   No facility-administered encounter medications on file as of  08/07/2023.     ALLERGIES:   Patient has no known allergies.   SOCIAL HISTORY:  Social History   Socioeconomic History   Marital status: Single  Tobacco Use   Smoking status: Every Day    Current packs/day: 1.00    Types: Cigarettes   Smokeless tobacco: Never  Substance and Sexual Activity   Alcohol use: Yes    Comment: 1-40   Social Drivers of Health   Food Insecurity: No Food Insecurity (03/06/2023)   Received from [facility]   Hunger Vital Sign    Worried About Running Out of Food in the Last Year: Never true    Ran Out of Food in the Last Year: Never true  Transportation Needs: No Transportation Needs (03/06/2023)   Received from [facility] - Transportation    Lack of Transportation (Medical): No    Lack of Transportation (Non-Medical): No    FAMILY HISTORY:  Family History  Adopted: Yes  Problem Relation Name Age of Onset   Breast cancer Mother     Other (Other) Father     Alcohol abuse Father     High blood pressure (Hypertension) Father     Arthritis Father     Breast cancer Maternal Grandmother     High blood pressure (Hypertension) Maternal Grandmother     High blood pressure (Hypertension) Maternal Grandfather     High blood pressure (Hypertension) Paternal Grandmother     High blood pressure (Hypertension) Paternal Grandfather       GENERAL REVIEW OF SYSTEMS:   General ROS: negative for - chills, fatigue, fever, weight gain or weight loss Allergy and Immunology ROS: negative for - hives  Hematological and Lymphatic ROS: negative for - bleeding problems or bruising, negative for palpable nodes Endocrine ROS: negative for - heat or cold intolerance, hair changes Respiratory ROS: Positive for - cough Cardiovascular ROS: no chest pain or palpitations GI ROS: negative for nausea, vomiting, abdominal pain, diarrhea, constipation Musculoskeletal ROS: Positive for - joint swelling or muscle pain Neurological ROS: negative for - confusion,  syncope Dermatological ROS: negative for pruritus and rash  PHYSICAL EXAM:  Vitals:   08/07/23 1023  BP: (!) 142/87  Pulse: 109  .  Ht:152.4 cm (5') Wt:52.6 kg (116 lb) WUJ:WJXB surface area is 1.49 meters squared. Body mass index is 22.65 kg/m.Marland Kitchen   GENERAL: Alert, active, oriented x3  HEENT: Pupils equal reactive to light. Extraocular movements are intact. Sclera clear. Palpebral conjunctiva normal red color.Pharynx clear.  NECK: Supple with no palpable mass and no adenopathy.  LUNGS: Sound  clear with no rales rhonchi or wheezes.  HEART: Regular rhythm S1 and S2 without murmur.  ABDOMEN: Soft and depressible, nontender with no palpable mass, no hepatomegaly.  Reducible right inguinal hernia, weakness in the left groin.  EXTREMITIES: Well-developed well-nourished symmetrical with no dependent edema.  NEUROLOGICAL: Awake alert oriented, facial expression symmetrical, moving all extremities.   Assessment & Plan Inguinal Hernia A chronic right-sided inguinal hernia has been present for 3-4 years, causing an intermittent slight burning sensation and a visible bulge that can be manually reduced. There are no signs of acute incarceration or strangulation, but symptoms worsen with coughing. Laparoscopic hernia repair was discussed, involving three incisions and mesh placement, with same-day surgery and a two-week recovery period. Potential post-surgical bruising and swelling in the scrotum and testicles were explained, expected to resolve within two weeks. Risks of recurrence, especially with continued smoking and coughing, were highlighted, along with the importance of avoiding heavy lifting and gym activities post-surgery. Schedule laparoscopic hernia repair within 1-2 weeks. Perform minimally invasive surgery with three incisions and mesh placement. Evaluate and potentially repair the left side if a smaller hernia is detected during surgery. Prescribe pain medication post-surgery, with  instructions to use Tylenol or ibuprofen as needed. Advise avoiding lifting over 20 pounds and refraining from gym activities for at least two weeks post-surgery. Instruct on the use of skin glue for incision care, allowing normal showering without dressing changes.  Chronic Cough Intermittent chronic cough exacerbates hernia symptoms by increasing intra-abdominal pressure. The impact of coughing on hernia repair and the importance of managing cough pre- and post-surgery were discussed. Coordinate with a pulmonologist to manage and minimize coughing episodes pre- and post-surgery.  Smoking Current smoking increases the risk of hernia recurrence and infection post-surgery. Despite multiple unsuccessful attempts at smoking cessation, the decision to proceed with surgery was made, with an understanding of the risks. Discuss the risks of smoking on surgical outcomes, including higher chances of recurrence and infection. Proceed with surgery despite smoking status, with an understanding of the associated risks.   Non-recurrent unilateral inguinal hernia without obstruction or gangrene [K40.90]          Patient verbalized understanding, all questions were answered, and were agreeable with the plan outlined above.   Carolan Shiver, MD  Electronically signed by Carolan Shiver, MD

## 2023-08-13 ENCOUNTER — Encounter
Admission: RE | Admit: 2023-08-13 | Discharge: 2023-08-13 | Disposition: A | Discharge status: Home / Self Care | Payer: Medicare Other | Source: Ambulatory Visit | Attending: General Surgery | Admitting: General Surgery

## 2023-08-13 ENCOUNTER — Other Ambulatory Visit: Payer: Self-pay

## 2023-08-13 NOTE — Patient Instructions (Addendum)
Your procedure is scheduled on: 08/22/2023  Wednesday Report to the Registration Desk on the 1st floor of the [facility]. To find out your arrival time, please call [phone] between 1PM - 3PM on: Tuesday 08/21/2023  If your arrival time is 6:00 am, do not arrive before that time as the [facility] entrance doors do not open until 6:00 am.  REMEMBER: Instructions that are not followed completely may result in serious medical risk, up to and including death; or upon the discretion of your surgeon and anesthesiologist your surgery may need to be rescheduled.  Do not eat food after midnight the night before surgery.  No gum chewing or hard candies.  One week prior to surgery: Stop Anti-inflammatories (NSAIDS) such as Advil, Aleve, Ibuprofen, Motrin, Naproxen, Naprosyn and Aspirin based products such as Excedrin, Goody's Powder, BC Powder. Stop ANY OVER THE COUNTER supplements until after surgery.ferrous sulfate 325,multivitamin, folic acid  You may however, continue to take Tylenol if needed for pain up until the day of surgery.  Do not take spironolactone (ALDACTONE) on day of surgery  DO not take Lasix on day of surgery   Continue taking all of your other prescription medications up until the day of surgery.  ON THE DAY OF SURGERY ONLY TAKE THESE MEDICATIONS WITH SIPS OF WATER:  hydrOXYzine (ATARAX)   Use inhalers on the day of surgery and bring it to the hospital  No Alcohol for 24 hours before or after surgery.  No Smoking including e-cigarettes for 24 hours before surgery.  No chewable tobacco products for at least 6 hours before surgery.  No nicotine patches on the day of surgery.  Do not use any "recreational" drugs for at least a week (preferably 2 weeks) before your surgery.  Please be advised that the combination of cocaine and anesthesia may have negative outcomes, up to and including death. If you test positive for cocaine, your surgery will be cancelled.  On  the morning of surgery brush your teeth with toothpaste and water, you may rinse your mouth with mouthwash if you wish. Do not swallow any toothpaste or mouthwash.  Use CHG Soap or wipes as directed on instruction sheet.  Do not wear jewelry, make-up, hairpins, clips or nail polish.  For welded (permanent) jewelry: bracelets, anklets, waist bands, etc.  Please have this removed prior to surgery.  If it is not removed, there is a chance that hospital personnel will need to cut it off on the day of surgery.  Do not wear lotions, powders, or perfumes.   Do not shave body hair from the neck down 48 hours before surgery.  Contact lenses, hearing aids and dentures may not be worn into surgery.  Do not bring valuables to the hospital. [facility] is not responsible for any missing/lost belongings or valuables.     Notify your doctor if there is any change in your medical condition (cold, fever, infection).  Wear comfortable clothing (specific to your surgery type) to the hospital.  After surgery, you can help prevent lung complications by doing breathing exercises.  Take deep breaths and cough every 1-2 hours. Your doctor may order a device called an Incentive Spirometer to help you take deep breaths. If you are being admitted to the hospital overnight, leave your suitcase in the car. After surgery it may be brought to your room.   If you are being discharged the day of surgery, you will not be allowed to drive home. You will need a responsible  individual to drive you home and stay with you for 24 hours after surgery.    Please call the Pre-admissions Testing Dept. at [phone] if you have any questions about these instructions.  Surgery Visitation Policy:  Patients having surgery or a procedure may have two visitors.  Children under the age of 53 must have an adult with them who is not the patient.  Temporary Visitor Restrictions Due to increasing cases of flu, RSV and  COVID-19: Children ages 12 and under will not be able to visit patients in [facility] hospitals under most circumstances.    Preparing for Surgery with CHLORHEXIDINE GLUCONATE (CHG) Soap  Chlorhexidine Gluconate (CHG) Soap  o An antiseptic cleaner that kills germs and bonds with the skin to continue killing germs even after washing  o Used for showering the night before surgery and morning of surgery  Before surgery, you can play an important role by reducing the number of germs on your skin.  CHG (Chlorhexidine gluconate) soap is an antiseptic cleanser which kills germs and bonds with the skin to continue killing germs even after washing.  Please do not use if you have an allergy to CHG or antibacterial soaps. If your skin becomes reddened/irritated stop using the CHG.  1. Shower the NIGHT BEFORE SURGERY and the MORNING OF SURGERY with CHG soap.  2. If you choose to wash your hair, wash your hair first as usual with your normal shampoo.  3. After shampooing, rinse your hair and body thoroughly to remove the shampoo.  4. Use CHG as you would any other liquid soap. You can apply CHG directly to the skin and wash gently with a scrungie or a clean washcloth.  5. Apply the CHG soap to your body only from the neck down. Do not use on open wounds or open sores. Avoid contact with your eyes, ears, mouth, and genitals (private parts). Wash face and genitals (private parts) with your normal soap.  6. Wash thoroughly, paying special attention to the area where your surgery will be performed.  7. Thoroughly rinse your body with warm water.  8. Do not shower/wash with your normal soap after using and rinsing off the CHG soap.  9. Pat yourself dry with a clean towel.  10. Wear clean pajamas to bed the night before surgery.  12. Place clean sheets on your bed the night of your first shower and do not sleep with pets.  13. Shower again with the CHG soap on the day of surgery prior to  arriving at the hospital.  14. Do not apply any deodorants/lotions/powders.  15. Please wear clean clothes to the hospital.

## 2023-08-17 ENCOUNTER — Encounter: Payer: Self-pay | Admitting: Urgent Care

## 2023-08-17 ENCOUNTER — Encounter
Admission: RE | Admit: 2023-08-17 | Discharge: 2023-08-17 | Disposition: A | Discharge status: Home / Self Care | Payer: Medicare Other | Source: Ambulatory Visit | Attending: General Surgery | Admitting: General Surgery

## 2023-08-17 DIAGNOSIS — K4190 Unilateral femoral hernia, without obstruction or gangrene, not specified as recurrent: Secondary | ICD-10-CM | POA: Insufficient documentation

## 2023-08-17 DIAGNOSIS — Z01812 Encounter for preprocedural laboratory examination: Secondary | ICD-10-CM | POA: Insufficient documentation

## 2023-08-17 LAB — BASIC METABOLIC PANEL
Anion gap: 8 (ref 5–15)
BUN: 11 mg/dL (ref 6–20)
CO2: 24 mmol/L (ref 22–32)
Calcium: 9 mg/dL (ref 8.9–10.3)
Chloride: 104 mmol/L (ref 98–111)
Creatinine, Ser: 0.75 mg/dL (ref 0.61–1.24)
GFR, Estimated: >60 mL/min (ref >60)
Glucose, Bld: 109 mg/dL — ABNORMAL HIGH (ref 70–99)
Potassium: 3.8 mmol/L (ref 3.5–5.1)
Sodium: 136 mmol/L (ref 135–145)

## 2023-08-21 MED ORDER — LACTATED RINGERS IV SOLN: INTRAVENOUS | Status: DC

## 2023-08-21 MED ORDER — ORAL CARE MOUTH RINSE: 15.0000 mL | Freq: Once | OROMUCOSAL | Status: AC

## 2023-08-21 MED ORDER — CHLORHEXIDINE GLUCONATE 0.12 % MT SOLN
15.0000 mL | Freq: Once | OROMUCOSAL | Status: AC
  Administered 2023-08-22: 15 mL via OROMUCOSAL

## 2023-08-22 ENCOUNTER — Encounter: Payer: Self-pay | Admitting: General Surgery

## 2023-08-22 ENCOUNTER — Ambulatory Visit: Payer: Medicare Other | Admitting: Anesthesiology

## 2023-08-22 ENCOUNTER — Encounter
Admission: RE | Disposition: A | Discharge status: Home / Self Care | Payer: Self-pay | Source: Ambulatory Visit | Attending: General Surgery

## 2023-08-22 ENCOUNTER — Ambulatory Visit
Admission: RE | Admit: 2023-08-22 | Discharge: 2023-08-22 | Disposition: A | Discharge status: Home / Self Care | Payer: Medicare Other | Source: Ambulatory Visit | Attending: General Surgery | Admitting: General Surgery

## 2023-08-22 ENCOUNTER — Other Ambulatory Visit: Payer: Self-pay

## 2023-08-22 DIAGNOSIS — I10 Essential (primary) hypertension: Secondary | ICD-10-CM | POA: Diagnosis not present

## 2023-08-22 DIAGNOSIS — R053 Chronic cough: Secondary | ICD-10-CM | POA: Diagnosis not present

## 2023-08-22 DIAGNOSIS — K4190 Unilateral femoral hernia, without obstruction or gangrene, not specified as recurrent: Secondary | ICD-10-CM

## 2023-08-22 DIAGNOSIS — K4090 Unilateral inguinal hernia, without obstruction or gangrene, not specified as recurrent: Secondary | ICD-10-CM | POA: Insufficient documentation

## 2023-08-22 DIAGNOSIS — F17210 Nicotine dependence, cigarettes, uncomplicated: Secondary | ICD-10-CM | POA: Diagnosis not present

## 2023-08-22 DIAGNOSIS — F419 Anxiety disorder, unspecified: Secondary | ICD-10-CM | POA: Diagnosis not present

## 2023-08-22 DIAGNOSIS — Z01812 Encounter for preprocedural laboratory examination: Secondary | ICD-10-CM

## 2023-08-22 DIAGNOSIS — D176 Benign lipomatous neoplasm of spermatic cord: Secondary | ICD-10-CM | POA: Insufficient documentation

## 2023-08-22 HISTORY — PX: INSERTION OF MESH: SHX5868

## 2023-08-22 SURGERY — REPAIR, HERNIA, INGUINAL, BILATERAL, ROBOT-ASSISTED
Anesthesia: General | Site: Inguinal | Laterality: Bilateral

## 2023-08-22 MED ORDER — ACETAMINOPHEN 10 MG/ML IV SOLN
INTRAVENOUS | Status: DC | PRN
  Administered 2023-08-22: 1000 mg via INTRAVENOUS

## 2023-08-22 MED ORDER — PROPOFOL 10 MG/ML IV BOLUS
INTRAVENOUS | Status: DC | PRN
  Administered 2023-08-22: 180 mg via INTRAVENOUS

## 2023-08-22 MED ORDER — ACETAMINOPHEN 10 MG/ML IV SOLN: 1000.0000 mg | Freq: Once | INTRAVENOUS | Status: DC | PRN

## 2023-08-22 MED ORDER — FENTANYL CITRATE (PF) 100 MCG/2ML IJ SOLN
INTRAMUSCULAR | Status: AC
  Filled 2023-08-22: qty 2

## 2023-08-22 MED ORDER — PHENYLEPHRINE 80 MCG/ML (10ML) SYRINGE FOR IV PUSH (FOR BLOOD PRESSURE SUPPORT)
PREFILLED_SYRINGE | INTRAVENOUS | Status: DC | PRN
  Administered 2023-08-22 (×4): 80 ug via INTRAVENOUS

## 2023-08-22 MED ORDER — ALBUTEROL SULFATE HFA 108 (90 BASE) MCG/ACT IN AERS
INHALATION_SPRAY | RESPIRATORY_TRACT | Status: DC | PRN
  Administered 2023-08-22: 2 via RESPIRATORY_TRACT

## 2023-08-22 MED ORDER — BUPIVACAINE-EPINEPHRINE 0.25% -1:200000 IJ SOLN
INTRAMUSCULAR | Status: DC | PRN
  Administered 2023-08-22: 30 mL

## 2023-08-22 MED ORDER — SUGAMMADEX SODIUM 200 MG/2ML IV SOLN
INTRAVENOUS | Status: DC | PRN
  Administered 2023-08-22: 200 mg via INTRAVENOUS

## 2023-08-22 MED ORDER — DEXMEDETOMIDINE HCL IN NACL 80 MCG/20ML IV SOLN
INTRAVENOUS | Status: DC | PRN
  Administered 2023-08-22: 8 ug via INTRAVENOUS
  Administered 2023-08-22: 4 ug via INTRAVENOUS

## 2023-08-22 MED ORDER — ROCURONIUM BROMIDE 10 MG/ML (PF) SYRINGE
PREFILLED_SYRINGE | INTRAVENOUS | Status: AC
  Filled 2023-08-22: qty 10

## 2023-08-22 MED ORDER — LIDOCAINE HCL (PF) 2 % IJ SOLN
INTRAMUSCULAR | Status: AC
  Filled 2023-08-22: qty 5

## 2023-08-22 MED ORDER — TRAMADOL HCL 50 MG PO TABS: 50.0000 mg | ORAL_TABLET | Freq: Four times a day (QID) | ORAL | 0 refills | Status: AC | PRN

## 2023-08-22 MED ORDER — DEXAMETHASONE SODIUM PHOSPHATE 10 MG/ML IJ SOLN
INTRAMUSCULAR | Status: DC | PRN
  Administered 2023-08-22 (×2): 5 mg via INTRAVENOUS

## 2023-08-22 MED ORDER — DEXAMETHASONE SODIUM PHOSPHATE 10 MG/ML IJ SOLN
INTRAMUSCULAR | Status: AC
  Filled 2023-08-22: qty 1

## 2023-08-22 MED ORDER — PROPOFOL 10 MG/ML IV BOLUS
INTRAVENOUS | Status: AC
  Filled 2023-08-22: qty 20

## 2023-08-22 MED ORDER — MIDAZOLAM HCL 2 MG/2ML IJ SOLN
INTRAMUSCULAR | Status: AC
  Filled 2023-08-22: qty 2

## 2023-08-22 MED ORDER — CEFAZOLIN SODIUM-DEXTROSE 2-4 GM/100ML-% IV SOLN
2.0000 g | INTRAVENOUS | Status: AC
  Administered 2023-08-22: 2 g via INTRAVENOUS

## 2023-08-22 MED ORDER — FENTANYL CITRATE (PF) 100 MCG/2ML IJ SOLN
25.0000 ug | INTRAMUSCULAR | Status: DC | PRN
  Administered 2023-08-22 (×2): 50 ug via INTRAVENOUS
  Administered 2023-08-22: 25 ug via INTRAVENOUS
  Administered 2023-08-22: 50 ug via INTRAVENOUS
  Administered 2023-08-22: 25 ug via INTRAVENOUS

## 2023-08-22 MED ORDER — FENTANYL CITRATE (PF) 100 MCG/2ML IJ SOLN
INTRAMUSCULAR | Status: DC | PRN
  Administered 2023-08-22 (×2): 50 ug via INTRAVENOUS

## 2023-08-22 MED ORDER — KETOROLAC TROMETHAMINE 30 MG/ML IJ SOLN
INTRAMUSCULAR | Status: DC | PRN
  Administered 2023-08-22: 30 mg via INTRAVENOUS

## 2023-08-22 MED ORDER — OXYCODONE HCL 5 MG PO TABS
5.0000 mg | ORAL_TABLET | Freq: Once | ORAL | Status: AC | PRN
  Administered 2023-08-22: 5 mg via ORAL

## 2023-08-22 MED ORDER — ONDANSETRON HCL 4 MG/2ML IJ SOLN
INTRAMUSCULAR | Status: DC | PRN
  Administered 2023-08-22: 4 mg via INTRAVENOUS

## 2023-08-22 MED ORDER — OXYCODONE HCL 5 MG PO TABS
ORAL_TABLET | ORAL | Status: AC
  Filled 2023-08-22: qty 1

## 2023-08-22 MED ORDER — CEFAZOLIN SODIUM-DEXTROSE 2-4 GM/100ML-% IV SOLN
INTRAVENOUS | Status: AC
  Filled 2023-08-22: qty 100

## 2023-08-22 MED ORDER — OXYCODONE HCL 5 MG/5ML PO SOLN: 5.0000 mg | Freq: Once | ORAL | Status: AC | PRN

## 2023-08-22 MED ORDER — ONDANSETRON HCL 4 MG/2ML IJ SOLN: 4.0000 mg | Freq: Once | INTRAMUSCULAR | Status: DC | PRN

## 2023-08-22 MED ORDER — ONDANSETRON HCL 4 MG/2ML IJ SOLN
INTRAMUSCULAR | Status: AC
  Filled 2023-08-22: qty 2

## 2023-08-22 MED ORDER — ACETAMINOPHEN 10 MG/ML IV SOLN
INTRAVENOUS | Status: AC
  Filled 2023-08-22: qty 100

## 2023-08-22 MED ORDER — LIDOCAINE HCL (CARDIAC) PF 100 MG/5ML IV SOSY
PREFILLED_SYRINGE | INTRAVENOUS | Status: DC | PRN
  Administered 2023-08-22: 80 mg via INTRAVENOUS

## 2023-08-22 MED ORDER — BUPIVACAINE-EPINEPHRINE (PF) 0.25% -1:200000 IJ SOLN
INTRAMUSCULAR | Status: AC
  Filled 2023-08-22: qty 30

## 2023-08-22 MED ORDER — ROCURONIUM BROMIDE 100 MG/10ML IV SOLN
INTRAVENOUS | Status: DC | PRN
  Administered 2023-08-22: 60 mg via INTRAVENOUS
  Administered 2023-08-22: 20 mg via INTRAVENOUS

## 2023-08-22 MED ORDER — MIDAZOLAM HCL 2 MG/2ML IJ SOLN
INTRAMUSCULAR | Status: DC | PRN
Start: 2023-08-22 — End: 2023-08-22
  Administered 2023-08-22: 2 mg via INTRAVENOUS

## 2023-08-22 MED ORDER — CHLORHEXIDINE GLUCONATE 0.12 % MT SOLN
OROMUCOSAL | Status: AC
  Filled 2023-08-22: qty 15

## 2023-08-22 MED ORDER — ALBUTEROL SULFATE HFA 108 (90 BASE) MCG/ACT IN AERS
INHALATION_SPRAY | RESPIRATORY_TRACT | Status: AC
  Filled 2023-08-22: qty 6.7

## 2023-08-22 SURGICAL SUPPLY — 43 items
BAG PRESSURE INF REUSE 1000 (BAG) IMPLANT
COVER TIP SHEARS 8 DVNC (MISCELLANEOUS) ×2 IMPLANT
COVER WAND RF STERILE (DRAPES) ×2 IMPLANT
DERMABOND ADVANCED .7 DNX12 (GAUZE/BANDAGES/DRESSINGS) ×2 IMPLANT
DRAPE ARM DVNC X/XI (DISPOSABLE) ×6 IMPLANT
DRAPE COLUMN DVNC XI (DISPOSABLE) ×2 IMPLANT
ELECT REM PT RETURN 9FT ADLT (ELECTROSURGICAL) ×2 IMPLANT
ELECTRODE REM PT RTRN 9FT ADLT (ELECTROSURGICAL) ×2 IMPLANT
FORCEPS BPLR FENES DVNC XI (FORCEP) ×2 IMPLANT
GLOVE BIO SURGEON STRL SZ 6.5 (GLOVE) ×4 IMPLANT
GLOVE BIOGEL PI IND STRL 6.5 (GLOVE) ×4 IMPLANT
GOWN STRL REUS W/ TWL LRG LVL3 (GOWN DISPOSABLE) ×6 IMPLANT
IRRIGATOR SUCT 8 DISP DVNC XI (IRRIGATION / IRRIGATOR) IMPLANT
IV CATH ANGIO 12GX3 LT BLUE (NEEDLE) IMPLANT
IV NS 1000ML BAXH (IV SOLUTION) IMPLANT
KIT PINK PAD W/HEAD ARE REST (MISCELLANEOUS) ×2 IMPLANT
KIT PINK PAD W/HEAD ARM REST (MISCELLANEOUS) ×2 IMPLANT
LABEL OR SOLS (LABEL) IMPLANT
MANIFOLD NEPTUNE II (INSTRUMENTS) ×2 IMPLANT
MESH 3DMAX MID 5X7 LT XLRG (Mesh General) IMPLANT
MESH 3DMAX MID 5X7 RT XLRG (Mesh General) IMPLANT
NDL DRIVE SUT CUT DVNC (INSTRUMENTS) ×2 IMPLANT
NDL HYPO 22X1.5 SAFETY MO (MISCELLANEOUS) ×2 IMPLANT
NDL INSUFFLATION 14GA 120MM (NEEDLE) ×2 IMPLANT
NEEDLE DRIVE SUT CUT DVNC (INSTRUMENTS) ×2 IMPLANT
NEEDLE HYPO 22X1.5 SAFETY MO (MISCELLANEOUS) ×2 IMPLANT
NEEDLE INSUFFLATION 14GA 120MM (NEEDLE) ×2 IMPLANT
OBTURATOR OPTICAL STND 8 DVNC (TROCAR) ×2 IMPLANT
OBTURATOR OPTICALSTD 8 DVNC (TROCAR) ×2 IMPLANT
PACK LAP CHOLECYSTECTOMY (MISCELLANEOUS) ×2 IMPLANT
SCISSORS MNPLR CVD DVNC XI (INSTRUMENTS) ×2 IMPLANT
SEAL UNIV 5-12 XI (MISCELLANEOUS) ×6 IMPLANT
SET TUBE SMOKE EVAC HIGH FLOW (TUBING) ×2 IMPLANT
SOL ELECTROSURG ANTI STICK (MISCELLANEOUS) ×2 IMPLANT
SOLUTION ELECTROSURG ANTI STCK (MISCELLANEOUS) ×2 IMPLANT
SUT MNCRL 4-0 27 PS-2 XMFL (SUTURE) ×2 IMPLANT
SUT STRATA 2-0 23CM CT-2 (SUTURE) ×2 IMPLANT
SUT VIC AB 2-0 SH 27XBRD (SUTURE) ×2 IMPLANT
SUTURE MNCRL 4-0 27XMF (SUTURE) ×2 IMPLANT
TAPE TRANSPORE STRL 2 31045 (GAUZE/BANDAGES/DRESSINGS) IMPLANT
TRAP FLUID SMOKE EVACUATOR (MISCELLANEOUS) ×2 IMPLANT
TRAY FOLEY MTR SLVR 16FR STAT (SET/KITS/TRAYS/PACK) ×2 IMPLANT
WATER STERILE IRR 500ML POUR (IV SOLUTION) ×2 IMPLANT

## 2023-08-22 NOTE — Transfer of Care (Signed)
 Immediate Anesthesia Transfer of Care Note  Patient: Keith Parker  Procedure(s) Performed: XI ROBOTIC ASSISTED BILATERAL INGUINAL HERNIA (Bilateral: Inguinal) INSERTION OF MESH (Bilateral)  Patient Location: PACU  Anesthesia Type:General  Level of Consciousness: awake  Airway & Oxygen Therapy: Patient Spontanous Breathing  Post-op Assessment: Report given to RN and Post -op Vital signs reviewed and stable  Post vital signs: Reviewed and stable  Last Vitals:  Vitals Value Taken Time  BP 104/81 08/22/23 1548  Temp    Pulse 83 08/22/23 1552  Resp 13 08/22/23 1552  SpO2 94 % 08/22/23 1552  Vitals shown include unfiled device data.  Last Pain:  Vitals:   08/22/23 0939  TempSrc: Temporal  PainSc: 0-No pain      Patients Stated Pain Goal: 0 (08/22/23 0939)  Complications: No notable events documented.

## 2023-08-22 NOTE — Anesthesia Preprocedure Evaluation (Signed)
 Anesthesia Evaluation  Patient identified by MRN, date of birth, ID band Patient awake    Reviewed: Allergy & Precautions, NPO status , Patient's Chart, lab work & pertinent test results  History of Anesthesia Complications Negative for: history of anesthetic complications  Airway Mallampati: II  TM Distance: >3 FB Neck ROM: Full    Dental no notable dental hx. (+) Teeth Intact   Pulmonary neg sleep apnea, neg COPD, Current SmokerPatient did not abstain from smoking.   Pulmonary exam normal breath sounds clear to auscultation       Cardiovascular Exercise Tolerance: Good METShypertension, Pt. on medications (-) CAD and (-) Past MI (-) dysrhythmias  Rhythm:Regular Rate:Normal - Systolic murmurs    Neuro/Psych Seizures -, Well Controlled,  PSYCHIATRIC DISORDERS Anxiety Depression       GI/Hepatic ,GERD  Controlled,,(+)     substance abuse  alcohol useDaily alcohol use   Endo/Other  neg diabetes    Renal/GU negative Renal ROS     Musculoskeletal   Abdominal   Peds  Hematology   Anesthesia Other Findings Past Medical History: No date: Allergy No date: Arthritis No date: Depression 10/29/2017: Elevated liver enzymes No date: GERD (gastroesophageal reflux disease) 12/31/2017: Hearing loss, conductive, bilateral No date: Hypertension No date: Polio No date: Seizures (HCC)     Comment:  last seizure about 7 years ago.  No date: Substance abuse (HCC)     Comment:  Alcohol abuse  Reproductive/Obstetrics                             Anesthesia Physical Anesthesia Plan  ASA: 2  Anesthesia Plan: General   Post-op Pain Management: Ofirmev IV (intra-op)* and Toradol IV (intra-op)*   Induction: Intravenous  PONV Risk Score and Plan: 3 and Ondansetron, Dexamethasone and Midazolam  Airway Management Planned: Oral ETT and Video Laryngoscope Planned  Additional Equipment: None  Intra-op  Plan:   Post-operative Plan: Extubation in OR  Informed Consent: I have reviewed the patients History and Physical, chart, labs and discussed the procedure including the risks, benefits and alternatives for the proposed anesthesia with the patient or authorized representative who has indicated his/her understanding and acceptance.     Dental advisory given  Plan Discussed with: CRNA and Surgeon  Anesthesia Plan Comments: (Discussed risks of anesthesia with patient, including PONV, sore throat, lip/dental/eye damage. Rare risks discussed as well, such as cardiorespiratory and neurological sequelae, and allergic reactions. Discussed the role of CRNA in patient's perioperative care. Patient understands. Patient counseled on benefits of smoking cessation, and increased perioperative risks associated with continued smoking. )       Anesthesia Quick Evaluation

## 2023-08-22 NOTE — Discharge Instructions (Addendum)

## 2023-08-22 NOTE — Op Note (Signed)
 Preoperative diagnosis:  Bilateral inguinal hernia.   Postoperative diagnosis:  inguinal hernia.  Procedure: Robotic assisted Laparoscopic Transabdominal preperitoneal laparoscopic (TAPP) repair of bilateral inguinal hernia.  Anesthesia: GETA  Surgeon: Dr. Hazle Quant  Wound Classification: Clean  Indications:  Patient is a 48 y.o. male developed a symptomatic bilateral inguinal hernia. Repair was indicated.  Findings: 1. Bilateral Inguinal hernia identified 2. Vas deferens and cord structures identified and preserved 3. Bard Extra Large 3D Max MID Anatomical mesh used for repair 4. Adequate hemostasis.   Description of procedure:  The patient was taken to the operating room and the correct side of surgery was verified. The patient was placed supine with right arm tucked at the side. After obtaining adequate anesthesia, the patient's abdomen was prepped and draped in standard sterile fashion. A time-out was completed verifying correct patient, procedure, site, positioning, and implant(s) and/or special equipment prior to beginning this procedure.  An incision was made in a natural skin line above the umbilicus. The fascia was elevated and the Veress needle inserted. Proper position was confirmed by aspiration and saline meniscus test.  The abdomen was insufflated with carbon dioxide to a pressure of 15 mmHg. The patient tolerated insufflation well.  Abdominal cavity was entered using Optiview technique with a millimeter trocar.  No injury was identified.  Another 2 mm trocars were placed lateral to each rectus muscle.  Scissors and bipolar forceps were inserted under direct visualization. Bilateral direct inguinal hernias repaired using the following tecnique: At the robotic console: Transverse peritoneal incision is made about 8 cm superior to the inguinal defect. Medial to the epigastric vessels, the parietal compartment is dissected to visualize the rectus muscle. This is carried down to  the symphysis pubis and the retropubic space is dissected to expose at least 2 cm contralateral to the midline. Cooper's ligament is exposed and cleared at least 2 cm below the ligament to ensure adequate space for the inferior border of the mesh. Hesselbach's triangle is cleared assessing for a direct hernia. The hernia is reduced dissecting the contents away from the border of the transversalis (white) fascia. Lateral to the epigastric vessels, the dissection is carried out in visceral compartment continuing in the true preperitoneal plane.  This dissection was continued until the cord structures are "parietalized" completely, allowing for visualization of the reflected peritoneum that is continuous with the line originating 2 cm below Coopers medially and across the psoas muscle in the lateral compartment.  The internal ring was interrogated for a cord lipoma. The cord lipoma was reduced to the retroperitoneum and seated dorsal to the preperitoneal mesh. Having achieved a complete dissection with a critical view of the entire myopectineal orifice, a Right and Left XL mesh was then positioned centered at the iliopubic tract with the medial side crossing the midline and the inferior edge positioned 2 cm below Coopers ligament. The lateral aspect of the mesh extended 3-5 cm beyond the lateral edge of the psoas. The mesh is fixated using an interrupted suture placed to the ipsilateral Coopers ligament. A second suture was done at the medial superior aspect of the mesh fixating this to the rectus complex.  The peritoneal flap is closed with running barbed suture. Additional holes in the peritoneum closed with suture. Preperitoneal space gas aspirated to visualize the peritoneum apposed directly against the mesh and ensure no folding, lifting, or buckling of the mesh. Skin is closed, sterile dressings are applied.  The patient tolerated the procedure well and was taken to the  postanesthesia care unit in stable  condition  Specimen: None  Complications: None  Estimated Blood Loss: 5 mL

## 2023-08-22 NOTE — Interval H&P Note (Signed)
 History and Physical Interval Note:  08/22/2023 1:21 PM  Keith Parker  has presented today for surgery, with the diagnosis of k40.90 Non recurrent unilateral hernia w/o obstruction or gangrene.  The various methods of treatment have been discussed with the patient and family. After consideration of risks, benefits and other options for treatment, the patient has consented to  Procedure(s): XI ROBOTIC ASSISTED BILATERAL INGUINAL HERNIA (Bilateral) as a surgical intervention.  The patient's history has been reviewed, patient examined, no change in status, stable for surgery.  I have reviewed the patient's chart and labs.  Questions were answered to the patient's satisfaction.     Carolan Shiver

## 2023-08-22 NOTE — Anesthesia Procedure Notes (Addendum)
 Procedure Name: Intubation Date/Time: 08/22/2023 1:57 PM  Performed by: Otho Perl, CRNAPre-anesthesia Checklist: Patient identified, Patient being monitored, Timeout performed, Emergency Drugs available and Suction available Patient Re-evaluated:Patient Re-evaluated prior to induction Oxygen Delivery Method: Circle system utilized Preoxygenation: Pre-oxygenation with 100% oxygen Induction Type: IV induction Ventilation: Mask ventilation without difficulty Laryngoscope Size: 3 and McGrath Grade View: Grade I Tube type: Oral Tube size: 6.5 mm Number of attempts: 1 Airway Equipment and Method: Stylet Placement Confirmation: ETT inserted through vocal cords under direct vision, positive ETCO2 and breath sounds checked- equal and bilateral Secured at: 20 cm Tube secured with: Tape Dental Injury: Teeth and Oropharynx as per pre-operative assessment

## 2023-08-22 NOTE — Anesthesia Postprocedure Evaluation (Signed)
 Anesthesia Post Note  Patient: Keith Parker  Procedure(s) Performed: XI ROBOTIC ASSISTED BILATERAL INGUINAL HERNIA (Bilateral: Inguinal) INSERTION OF MESH (Bilateral)  Patient location during evaluation: PACU Anesthesia Type: General Level of consciousness: awake and alert Pain management: pain level controlled Vital Signs Assessment: post-procedure vital signs reviewed and stable Respiratory status: spontaneous breathing, nonlabored ventilation, respiratory function stable and patient connected to nasal cannula oxygen Cardiovascular status: blood pressure returned to baseline and stable Postop Assessment: no apparent nausea or vomiting Anesthetic complications: no   No notable events documented.   Last Vitals:  Vitals:   08/22/23 1625 08/22/23 1635  BP:  118/80  Pulse: 79 91  Resp: 11 18  Temp:  (!) 36.1 C  SpO2: 93% 94%    Last Pain:  Vitals:   08/22/23 1635  TempSrc: Temporal  PainSc: 4                  Louie Boston

## 2023-08-23 NOTE — Progress Notes (Signed)
 Patient doing well, just post op pain relieved with medication and positioning. No other concerns per patient.

## 2023-08-24 ENCOUNTER — Encounter: Payer: Self-pay | Admitting: General Surgery

## 2023-10-24 ENCOUNTER — Other Ambulatory Visit: Payer: Self-pay | Admitting: Pulmonary Disease

## 2023-10-24 DIAGNOSIS — R911 Solitary pulmonary nodule: Secondary | ICD-10-CM

## 2023-11-08 ENCOUNTER — Ambulatory Visit
Admission: RE | Admit: 2023-11-08 | Discharge: 2023-11-08 | Disposition: A | Discharge status: Home / Self Care | Source: Ambulatory Visit | Attending: Pulmonary Disease | Admitting: Pulmonary Disease

## 2023-11-08 ENCOUNTER — Other Ambulatory Visit: Payer: Self-pay | Admitting: Pulmonary Disease

## 2023-11-08 DIAGNOSIS — R911 Solitary pulmonary nodule: Secondary | ICD-10-CM | POA: Diagnosis present

## 2023-12-03 ENCOUNTER — Other Ambulatory Visit: Payer: Self-pay | Admitting: Gastroenterology

## 2024-01-10 ENCOUNTER — Other Ambulatory Visit: Payer: Self-pay | Admitting: Neurology

## 2024-01-10 DIAGNOSIS — M4807 Spinal stenosis, lumbosacral region: Secondary | ICD-10-CM

## 2024-01-16 ENCOUNTER — Ambulatory Visit
Admission: RE | Admit: 2024-01-16 | Discharge: 2024-01-16 | Disposition: A | Discharge status: Home / Self Care | Source: Ambulatory Visit | Attending: Neurology | Admitting: Neurology

## 2024-01-16 DIAGNOSIS — M4807 Spinal stenosis, lumbosacral region: Secondary | ICD-10-CM | POA: Diagnosis present

## 2024-06-25 ENCOUNTER — Emergency Department: Admission: EM | Admit: 2024-06-25 | Discharge: 2024-06-25 | Disposition: A | Discharge status: Home / Self Care

## 2024-06-25 DIAGNOSIS — S59912A Unspecified injury of left forearm, initial encounter: Secondary | ICD-10-CM | POA: Diagnosis present

## 2024-06-25 DIAGNOSIS — F17200 Nicotine dependence, unspecified, uncomplicated: Secondary | ICD-10-CM | POA: Insufficient documentation

## 2024-06-25 DIAGNOSIS — W268XXA Contact with other sharp object(s), not elsewhere classified, initial encounter: Secondary | ICD-10-CM | POA: Insufficient documentation

## 2024-06-25 DIAGNOSIS — Z23 Encounter for immunization: Secondary | ICD-10-CM | POA: Insufficient documentation

## 2024-06-25 DIAGNOSIS — S51812A Laceration without foreign body of left forearm, initial encounter: Secondary | ICD-10-CM | POA: Diagnosis not present

## 2024-06-25 MED ORDER — TETANUS-DIPHTH-ACELL PERTUSSIS 5-2-15.5 LF-MCG/0.5 IM SUSP
0.5000 mL | Freq: Once | INTRAMUSCULAR | Status: AC
  Administered 2024-06-25: 0.5 mL via INTRAMUSCULAR
  Filled 2024-06-25: qty 0.5

## 2024-06-25 NOTE — Discharge Instructions (Addendum)
 Keep your wound clean, wash with soap and water multiple times per day.  Please return for any new, worsening, or changing symptoms or other concerns.  It was a pleasure caring for you today.

## 2024-06-25 NOTE — ED Provider Notes (Signed)
 "  [facility] Provider Note    Event Date/Time   First MD Initiated Contact with Patient 06/25/24 1405     (approximate)   History   Extremity Laceration   HPI  Keith Parker is a 48 y.o. male with a past medical history of polysubstance abuse who presents today for evaluation of laceration to his left arm.  Patient reports that he was trying to test a safety feature on a switch blade and accidentally cut his left forearm.  He reports that it was bleeding quite a bit but the bleeding has become controlled with a bandage.  He is unsure of his last tetanus shot.  He denies numbness or tingling or weakness in his hand or arm.  Patient Active Problem List   Diagnosis Date Noted   Esophageal varices without bleeding (HCC) 06/04/2023   Protein-calorie malnutrition, severe 03/07/2023   Lobar pneumonia 03/05/2023   Hydropneumothorax 03/05/2023   Cavitary pneumonia 03/05/2023   Screening for colon cancer 06/15/2022   Esophageal varices in cirrhosis (HCC) 09/21/2021   Macrocytic anemia 09/21/2021   Hyponatremia 09/20/2021   Alcoholic cirrhosis of liver with ascites (HCC) 09/20/2021   Hypochloremia 09/20/2021   Anasarca    Hearing loss, conductive, bilateral 12/31/2017   Elevated liver enzymes 10/29/2017   Heavy cigarette smoker 07/04/2017   Elevated blood pressure reading without diagnosis of hypertension 07/04/2017   Generalized anxiety disorder 02/01/2015   Seasonal allergies 02/01/2015   Rash and nonspecific skin eruption 02/01/2015   Alcohol  abuse 02/01/2015   Chronic, continuous use of opioids 01/27/2014   Controlled substance agreement signed 11/03/2013   Weight loss 08/06/2013   Attention or concentration deficit 08/06/2013   Chronic pain 08/06/2013   Tobacco use disorder 08/06/2013   Nondependent cannabis abuse 06/09/2013   Personality disorder (HCC) 06/09/2013   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   Insomnia 01/31/2013   Backache 01/31/2013    Depressive disorder 01/31/2013   Nondependent alcohol  abuse, in remission 01/31/2013   Pain in joint, pelvic region and thigh 01/31/2013          Physical Exam   Triage Vital Signs: ED Triage Vitals  Encounter Vitals Group     BP 06/25/24 1324 (!) 133/90     Girls Systolic BP Percentile --      Girls Diastolic BP Percentile --      Boys Systolic BP Percentile --      Boys Diastolic BP Percentile --      Pulse Rate 06/25/24 1324 (!) 114     Resp 06/25/24 1324 20     Temp 06/25/24 1324 98.1 F (36.7 C)     Temp Source 06/25/24 1324 Oral     SpO2 06/25/24 1324 93 %     Weight 06/25/24 1324 125 lb (56.7 kg)     Height 06/25/24 1324 5' 1 (1.549 m)     Head Circumference --      Peak Flow --      Pain Score 06/25/24 1327 8     Pain Loc --      Pain Education --      Exclude from Growth Chart --     Most recent vital signs: Vitals:   06/25/24 1324  BP: (!) 133/90  Pulse: (!) 114  Resp: 20  Temp: 98.1 F (36.7 C)  SpO2: 93%    Physical Exam Vitals and nursing note reviewed.  Constitutional:      General: Awake and alert. No acute distress.  Appearance: Normal appearance. The patient is normal weight.  HENT:     Head: Normocephalic and atraumatic.     Mouth: Mucous membranes are moist.  Eyes:     General: PERRL. Normal EOMs        Right eye: No discharge.        Left eye: No discharge.     Conjunctiva/sclera: Conjunctivae normal.  Cardiovascular:     Rate and Rhythm: Normal rate.     Pulses: Normal pulses.  Pulmonary:     Effort: Pulmonary effort is normal. No respiratory distress.     Breath sounds: Normal breath sounds.  Abdominal:     Abdomen is soft.  Musculoskeletal:        General: No swelling. Normal range of motion.     Cervical back: Normal range of motion and neck supple. Left forearm with 0.5 cm puncture wound that is no longer bleeding to the dorsum of left forearm.  Normal intrinsic muscle function of the hand.  Normal strength and  sensation throughout hand, wrist, and elbow.  No active bleeding.  Normal Allen's test.  Normal radial and ulnar test.  Patient intact to light touch throughout. Skin:    General: Skin is warm and dry.     Capillary Refill: Capillary refill takes less than 2 seconds.     Findings: No rash.  Neurological:     Mental Status: The patient is awake and alert.      ED Results / Procedures / Treatments   Labs (all labs ordered are listed, but only abnormal results are displayed) Labs Reviewed - No data to display   EKG     RADIOLOGY     PROCEDURES:  Critical Care performed:   Procedures   MEDICATIONS ORDERED IN ED: Medications  Tdap (ADACEL) injection 0.5 mL (0.5 mLs Intramuscular Given 06/25/24 1430)     IMPRESSION / MDM / ASSESSMENT AND PLAN / ED COURSE  I reviewed the triage vital signs and the nursing notes.   Differential diagnosis includes, but is not limited to, laceration, puncture wound, hematoma  Patient is awake and alert, hemodynamically stable and neurovascularly intact.  He has normal muscle function in his hand, normal strength throughout his arm.  Sensation is intact light touch throughout.  Do not suspect tendon/ligament or nerve injury.  Normal Allen's test with intact radial and ulnar pulses.  Laceration is only 0.5 cm and is hemostatic.  Wound was irrigated under the tap, as well as with normal saline and chlorhexidine  with no further bleeding.  Bandage was applied, though no closure necessary which patient is in agreement with.  He was given an updated tetanus shot.  We discussed return precautions, wound care, and outpatient follow-up.  Patient understands and agrees with plan.  Discharged in stable condition.   Patient's presentation is most consistent with acute complicated illness / injury requiring diagnostic workup.      FINAL CLINICAL IMPRESSION(S) / ED DIAGNOSES   Final diagnoses:  Laceration of left forearm, initial encounter     Rx  / DC Orders   ED Discharge Orders     None        Note:  This document was prepared using Dragon voice recognition software and may include unintentional dictation errors.   Nixon Sparr E, PA-C 06/25/24 1455    Nicholaus Rolland BRAVO, MD 06/25/24 1546  "

## 2024-06-25 NOTE — ED Triage Notes (Signed)
 Pt presents to the ED via POV from home. Pt reports that he was trying to test a safety feature out on a switch blade and accidentally cut his left FA. Pt reports the wound was gushing blood. Bleeding controlled with bandage at this time. Unknown last tetanus shot. Pt has full movement in his hand.

## 2024-07-01 DIAGNOSIS — F17210 Nicotine dependence, cigarettes, uncomplicated: Secondary | ICD-10-CM

## 2024-07-01 DIAGNOSIS — J449 Chronic obstructive pulmonary disease, unspecified: Secondary | ICD-10-CM

## 2024-07-01 DIAGNOSIS — J40 Bronchitis, not specified as acute or chronic: Secondary | ICD-10-CM

## 2024-07-09 ENCOUNTER — Ambulatory Visit

## 2024-07-29 ENCOUNTER — Inpatient Hospital Stay: Admission: RE | Admit: 2024-07-29 | Source: Ambulatory Visit

## 2024-07-29 ENCOUNTER — Telehealth: Payer: Self-pay | Admitting: Oncology

## 2024-07-29 NOTE — Telephone Encounter (Signed)
 Pt called to get Lung Screen r/s for [facility] - gave pt number incase he got disconnected, and transferred pt to [phone] [facility]) for r/s - [facility]

## 2024-08-26 ENCOUNTER — Ambulatory Visit
